# Patient Record
Sex: Male | Born: 1995 | Race: Black or African American | Hispanic: No | Marital: Single | Smoking: Never smoker
Health system: Southern US, Community
[De-identification: ages and names within clinical notes are randomized; demographics above are authoritative.]

## PROBLEM LIST (undated history)

## (undated) DIAGNOSIS — J690 Pneumonitis due to inhalation of food and vomit: Secondary | ICD-10-CM

## (undated) DIAGNOSIS — K222 Esophageal obstruction: Secondary | ICD-10-CM

## (undated) DIAGNOSIS — J189 Pneumonia, unspecified organism: Secondary | ICD-10-CM

## (undated) DIAGNOSIS — F840 Autistic disorder: Secondary | ICD-10-CM

## (undated) DIAGNOSIS — J9690 Respiratory failure, unspecified, unspecified whether with hypoxia or hypercapnia: Secondary | ICD-10-CM

## (undated) DIAGNOSIS — G930 Cerebral cysts: Secondary | ICD-10-CM

## (undated) DIAGNOSIS — I493 Ventricular premature depolarization: Secondary | ICD-10-CM

## (undated) DIAGNOSIS — Q666 Other congenital valgus deformities of feet: Secondary | ICD-10-CM

## (undated) DIAGNOSIS — G40901 Epilepsy, unspecified, not intractable, with status epilepticus: Secondary | ICD-10-CM

## (undated) DIAGNOSIS — G40909 Epilepsy, unspecified, not intractable, without status epilepticus: Secondary | ICD-10-CM

## (undated) HISTORY — DX: Pneumonia, unspecified organism: J18.9

## (undated) HISTORY — PX: REPLACE / REVISE VAGAL NERVE STIMULATOR: SUR1213

---
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
## 2015-03-08 ENCOUNTER — Encounter
Admission: EM | Disposition: A | Discharge status: Home Health | Payer: Self-pay | Source: Home / Self Care | Attending: Emergency Medicine

## 2015-03-08 ENCOUNTER — Ambulatory Visit: Payer: 59 | Admitting: Anesthesiology

## 2015-03-08 ENCOUNTER — Emergency Department: Payer: 59

## 2015-03-08 ENCOUNTER — Ambulatory Visit
Admission: EM | Admit: 2015-03-08 | Discharge: 2015-03-08 | Disposition: A | Discharge status: Home Health | Payer: 59 | Attending: Emergency Medicine | Admitting: Emergency Medicine

## 2015-03-08 DIAGNOSIS — F840 Autistic disorder: Secondary | ICD-10-CM | POA: Insufficient documentation

## 2015-03-08 DIAGNOSIS — T18198A Other foreign object in esophagus causing other injury, initial encounter: Secondary | ICD-10-CM | POA: Insufficient documentation

## 2015-03-08 DIAGNOSIS — Z7951 Long term (current) use of inhaled steroids: Secondary | ICD-10-CM | POA: Insufficient documentation

## 2015-03-08 DIAGNOSIS — Z79899 Other long term (current) drug therapy: Secondary | ICD-10-CM | POA: Diagnosis not present

## 2015-03-08 DIAGNOSIS — I493 Ventricular premature depolarization: Secondary | ICD-10-CM | POA: Diagnosis not present

## 2015-03-08 DIAGNOSIS — Z9889 Other specified postprocedural states: Secondary | ICD-10-CM | POA: Insufficient documentation

## 2015-03-08 DIAGNOSIS — K2970 Gastritis, unspecified, without bleeding: Secondary | ICD-10-CM | POA: Insufficient documentation

## 2015-03-08 DIAGNOSIS — X58XXXA Exposure to other specified factors, initial encounter: Secondary | ICD-10-CM | POA: Insufficient documentation

## 2015-03-08 DIAGNOSIS — M2140 Flat foot [pes planus] (acquired), unspecified foot: Secondary | ICD-10-CM | POA: Insufficient documentation

## 2015-03-08 DIAGNOSIS — Z91010 Allergy to peanuts: Secondary | ICD-10-CM | POA: Diagnosis not present

## 2015-03-08 DIAGNOSIS — T18108A Unspecified foreign body in esophagus causing other injury, initial encounter: Secondary | ICD-10-CM | POA: Diagnosis not present

## 2015-03-08 DIAGNOSIS — E739 Lactose intolerance, unspecified: Secondary | ICD-10-CM | POA: Diagnosis not present

## 2015-03-08 DIAGNOSIS — G40901 Epilepsy, unspecified, not intractable, with status epilepticus: Secondary | ICD-10-CM | POA: Diagnosis not present

## 2015-03-08 DIAGNOSIS — Y939 Activity, unspecified: Secondary | ICD-10-CM | POA: Diagnosis not present

## 2015-03-08 DIAGNOSIS — Z0389 Encounter for observation for other suspected diseases and conditions ruled out: Secondary | ICD-10-CM | POA: Diagnosis not present

## 2015-03-08 HISTORY — DX: Other congenital valgus deformities of feet: Q66.6

## 2015-03-08 HISTORY — DX: Epilepsy, unspecified, not intractable, with status epilepticus: G40.901

## 2015-03-08 HISTORY — DX: Pneumonitis due to inhalation of food and vomit: J69.0

## 2015-03-08 HISTORY — DX: Respiratory failure, unspecified, unspecified whether with hypoxia or hypercapnia: J96.90

## 2015-03-08 HISTORY — PX: FOREIGN BODY REMOVAL: SHX962

## 2015-03-08 HISTORY — DX: Autistic disorder: F84.0

## 2015-03-08 HISTORY — DX: Ventricular premature depolarization: I49.3

## 2015-03-08 HISTORY — DX: Cerebral cysts: G93.0

## 2015-03-08 HISTORY — DX: Epilepsy, unspecified, not intractable, without status epilepticus: G40.909

## 2015-03-08 SURGERY — ESOPHAGOGASTRODUODENOSCOPY (EGD) WITH PROPOFOL
Anesthesia: General

## 2015-03-08 SURGERY — REMOVAL, FOREIGN BODY
Anesthesia: General

## 2015-03-08 MED ORDER — FENTANYL CITRATE (PF) 100 MCG/2ML IJ SOLN
INTRAMUSCULAR | Status: DC | PRN
Start: 2015-03-08 — End: 2015-03-08
  Administered 2015-03-08: 50 ug via INTRAVENOUS

## 2015-03-08 MED ORDER — SUGAMMADEX SODIUM 200 MG/2ML IV SOLN
INTRAVENOUS | Status: DC | PRN
Start: 2015-03-08 — End: 2015-03-08
  Administered 2015-03-08: 150 mg via INTRAVENOUS

## 2015-03-08 MED ORDER — ROCURONIUM BROMIDE 100 MG/10ML IV SOLN
INTRAVENOUS | Status: DC | PRN
  Administered 2015-03-08: 40 mg via INTRAVENOUS

## 2015-03-08 MED ORDER — MIDAZOLAM HCL 2 MG/2ML IJ SOLN
INTRAMUSCULAR | Status: DC | PRN
  Administered 2015-03-08: 1 mg via INTRAVENOUS

## 2015-03-08 MED ORDER — ONDANSETRON HCL 4 MG/2ML IJ SOLN
INTRAMUSCULAR | Status: DC | PRN
  Administered 2015-03-08: 4 mg via INTRAVENOUS

## 2015-03-08 MED ORDER — PROPOFOL 10 MG/ML IV BOLUS
INTRAVENOUS | Status: DC | PRN
  Administered 2015-03-08: 150 mg via INTRAVENOUS

## 2015-03-08 MED ORDER — LIDOCAINE HCL (CARDIAC) 20 MG/ML IV SOLN
INTRAVENOUS | Status: DC | PRN
  Administered 2015-03-08: 60 mg via INTRAVENOUS

## 2015-03-08 MED ORDER — SODIUM CHLORIDE 0.9 % IV SOLN
INTRAVENOUS | Status: DC
  Administered 2015-03-08: 1000 mL via INTRAVENOUS
  Administered 2015-03-08: 14:00:00 via INTRAVENOUS

## 2015-03-08 MED ORDER — PROMETHAZINE HCL 25 MG/ML IJ SOLN: 6.2500 mg | INTRAMUSCULAR | Status: DC | PRN

## 2015-03-08 MED ORDER — FENTANYL CITRATE (PF) 100 MCG/2ML IJ SOLN: 25.0000 ug | INTRAMUSCULAR | Status: DC | PRN

## 2015-03-08 NOTE — ED Notes (Signed)
Pt is low functioning autistic per mom.  Pt eats things without understanding what they are.  Pt ate part of pullup before bed.  Mom became concerned after they could not give patient seizure medication.

## 2015-03-08 NOTE — ED Notes (Signed)
Pt transported to endo.  

## 2015-03-08 NOTE — Anesthesia Postprocedure Evaluation (Signed)
Anesthesia Post Note  Patient: Gustav A Agudelo  Procedure(s) Performed: Procedure(s) (LRB): FOREIGN BODY REMOVAL (N/A)  Patient location during evaluation: PACU Anesthesia Type: General Level of consciousness: awake and alert Pain management: pain level controlled Vital Signs Assessment: post-procedure vital signs reviewed and stable Respiratory status: spontaneous breathing, nonlabored ventilation, respiratory function stable and patient connected to nasal cannula oxygen Cardiovascular status: blood pressure returned to baseline and stable Postop Assessment: no signs of nausea or vomiting Anesthetic complications: no    Last Vitals:  Filed Vitals:   03/08/15 1445 03/08/15 1453  BP: 104/59 121/81  Pulse: 71 78  Temp: 36.3 C   Resp: 13 18    Last Pain: There were no vitals filed for this visit.               Lenard Simmer

## 2015-03-08 NOTE — Transfer of Care (Signed)
Immediate Anesthesia Transfer of Care Note  Patient: Noah Marshall  Procedure(s) Performed: Procedure(s): FOREIGN BODY REMOVAL (N/A)  Patient Location: PACU  Anesthesia Type:General  Level of Consciousness: awake  Airway & Oxygen Therapy: Patient Spontanous Breathing  Post-op Assessment: Report given to RN  Post vital signs: Reviewed and stable  Last Vitals:  Filed Vitals:   03/08/15 1425 03/08/15 1431  BP: 113/70 113/70  Pulse: 73 86  Temp: 36.2 C 97.3F  Resp: 13 23    Complications: No apparent anesthesia complications

## 2015-03-08 NOTE — ED Provider Notes (Signed)
[facility] Emergency Department Provider Note  ____________________________________________  Time seen: 4:15 AM  I have reviewed the triage vital signs and the nursing notes.   HISTORY  Chief Complaint Swallowed Foreign Body      HPI Noah Marshall is a 20 y.o. male with history of autism presents with history of "eating a part of his depends diaper" before bed. Patient's mother states that the young man "put stuff in his mouth all the time". However since the event last night he has had persistent drooling and inability to drink or take his medications by mouth.     Past Medical History  Diagnosis Date  . Autism   . Intracranial arachnoid cysts   . Epilepsy (HCC)   . Epilepsy with status epilepticus (HCC)   . Premature ventricular contractions   . Aspiration pneumonia (HCC)   . Pes planovalgus   . Respiratory failure (HCC)     There are no active problems to display for this patient.   Past Surgical History  Procedure Laterality Date  . Implantation vagal nerve stimulator  2011    Current Outpatient Rx  Name  Route  Sig  Dispense  Refill  . albuterol (PROVENTIL) (2.5 MG/3ML) 0.083% nebulizer solution   Nebulization   Take 2.5 mg by nebulization every 6 (six) hours as needed for wheezing or shortness of breath.         . cetirizine (ZYRTEC) 1 MG/ML syrup   Oral   Take 15 mg by mouth daily.         . cloBAZam (ONFI) 2.5 MG/ML solution   Oral   Take 6 mg by mouth 2 (two) times daily.         . clonazePAM (KLONOPIN) 0.5 MG tablet   Oral   Take 0.5 mg by mouth 3 (three) times daily as needed for anxiety.         . fluticasone (FLONASE) 50 MCG/ACT nasal spray   Each Nare   Place 2 sprays into both nostrils daily.         Marland Kitchen guanFACINE (TENEX) 1 MG tablet   Oral   Take 1 mg by mouth at bedtime.         . levOCARNitine (CARNITOR) 1 GM/10ML solution   Oral   Take 500 mg by mouth 2 (two) times daily.         .  montelukast (SINGULAIR) 10 MG tablet   Oral   Take 10 mg by mouth at bedtime.         . OXcarbazepine (TRILEPTAL) 300 MG/5ML suspension   Oral   Take 600 mg by mouth 2 (two) times daily.          Marland Kitchen topiramate (TOPAMAX) 200 MG tablet   Oral   Take 200 mg by mouth 2 (two) times daily.         Marland Kitchen valproic acid (DEPAKENE) 250 MG/5ML syrup   Oral   Take 500 mg by mouth 2 (two) times daily.           Allergies Lactose intolerance (gi) and Peanut-containing drug products  No family history on file.  Social History Social History  Substance Use Topics  . Smoking status: Not on file  . Smokeless tobacco: Not on file  . Alcohol Use: Not on file    Review of Systems  Constitutional: Negative for fever. Eyes: Negative for visual changes. ENT: Negative for sore throat. Positive for drooling Cardiovascular: Negative for chest pain. Respiratory:  Negative for shortness of breath. Gastrointestinal: Negative for abdominal pain, vomiting and diarrhea. Genitourinary: Negative for dysuria. Musculoskeletal: Negative for back pain. Skin: Negative for rash. Neurological: Negative for headaches, focal weakness or numbness.   10-point ROS otherwise negative.  ____________________________________________   PHYSICAL EXAM:  VITAL SIGNS: ED Triage Vitals  Enc Vitals Group     BP 03/08/15 0356 110/67 mmHg     Pulse Rate 03/08/15 0356 87     Resp 03/08/15 0356 20     Temp --      Temp src --      SpO2 03/08/15 0356 99 %     Weight --      Height --      Head Cir --      Peak Flow --      Pain Score --      Pain Loc --      Pain Edu? --      Excl. in GC? --      Constitutional: Alert and oriented. Actively drooling on my arrival to the room Eyes: Conjunctivae are normal. PERRL. Normal extraocular movements. ENT   Head: Normocephalic and atraumatic.   Nose: No congestion/rhinnorhea.   Mouth/Throat: Mucous membranes are moist.   Neck: No  stridor. Hematological/Lymphatic/Immunilogical: No cervical lymphadenopathy. Cardiovascular: Normal rate, regular rhythm. Normal and symmetric distal pulses are present in all extremities. No murmurs, rubs, or gallops. Respiratory: Normal respiratory effort without tachypnea nor retractions. Breath sounds are clear and equal bilaterally. No wheezes/rales/rhonchi. Gastrointestinal: Soft and nontender. No distention. There is no CVA tenderness. Genitourinary: deferred Musculoskeletal: Nontender with normal range of motion in all extremities. No joint effusions.  No lower extremity tenderness nor edema. Neurologic:  Normal speech and language. No gross focal neurologic deficits are appreciated. Speech is normal.  Skin:  Skin is warm, dry and intact. No rash noted. Psychiatric: Mood and affect are normal. Speech and behavior are normal. Patient exhibits appropriate insight and judgment.      RADIOLOGY    DG Neck Soft Tissue (Final result) Result time: 03/08/15 02:18:46   Final result by Rad Results In Interface (03/08/15 02:18:46)   Narrative:   CLINICAL DATA: Question of foreign body in throat. Patient rolling. Initial encounter.  EXAM: NECK SOFT TISSUES - 1+ VIEW  COMPARISON: None.  FINDINGS: No radiopaque foreign bodies are seen, aside from the leads along the left side of the neck and left chest wall.  The nasopharynx, oropharynx and hypopharynx are unremarkable. The epiglottis is normal in thickness. Prevertebral soft tissues are within normal limits. No acute osseous abnormalities are seen. The visualized lung apices are grossly clear.  IMPRESSION: No radiopaque foreign bodies seen.   Electronically Signed By: Roanna Raider M.D. On: 03/08/2015 02:18          DG Chest 2 View (Final result) Result time: 03/08/15 02:19:00   Final result by Rad Results In Interface (03/08/15 02:19:00)   Narrative:   CLINICAL DATA: 86-year-old male but part of foreign  are. In the throat.  EXAM: CHEST 2 VIEW  COMPARISON: Radiograph of the port  FINDINGS: Two views of the chest are demonstrate a focal consolidation there is no pleural vertebra are correct. The cardiac silhouette is within normal limits. A stimulator device with generator over the left pectoral region noted at the extending in the lower cervical region. No other foreign object identified. The osseous structures are grossly unremarkable.  IMPRESSION: No radiopaque foreign object identified over the expected course of the esophagus.  Electronically Signed By: Elgie Collard M.D. On: 03/08/2015 02:19        INITIAL IMPRESSION / ASSESSMENT AND PLAN / ED COURSE  Pertinent labs & imaging results that were available during my care of the patient were reviewed by me and considered in my medical decision making (see chart for details). Patient unable to tolerate drinking carbonated beverage in the emergency department Patient discussed with Dr. Alycia Rossetti gastrologist on call with plan for endoscopy for possible esophageal foreign body.  ____________________________________________   FINAL CLINICAL IMPRESSION(S) / ED DIAGNOSES  Final diagnoses:  Esophageal foreign body, initial encounter      Darci Current, MD 03/08/15 [phone]

## 2015-03-08 NOTE — ED Notes (Signed)
Report given to Tiffany in Endo, [place] states they will start an IV in endoscopy, family notified that they would be coming to get pt soon

## 2015-03-08 NOTE — H&P (Signed)
Primary Care Physician:  No primary care provider on file.  Pre-Procedure History & Physical: HPI:  Noah Marshall is a 20 y.o. male is here for an endoscopy.   Past Medical History  Diagnosis Date  . Autism   . Intracranial arachnoid cysts   . Epilepsy (HCC)   . Epilepsy with status epilepticus (HCC)   . Premature ventricular contractions   . Aspiration pneumonia (HCC)   . Pes planovalgus   . Respiratory failure [facility])     Past Surgical History  Procedure Laterality Date  . Implantation vagal nerve stimulator  2011    Prior to Admission medications   Medication Sig Start Date End Date Taking? Authorizing Provider  albuterol (PROVENTIL) (2.5 MG/3ML) 0.083% nebulizer solution Take 2.5 mg by nebulization every 6 (six) hours as needed for wheezing or shortness of breath.   Yes Historical Provider, MD  cetirizine (ZYRTEC) 1 MG/ML syrup Take 15 mg by mouth daily.   Yes Historical Provider, MD  cloBAZam (ONFI) 2.5 MG/ML solution Take 6 mg by mouth 2 (two) times daily.   Yes Historical Provider, MD  clonazePAM (KLONOPIN) 0.5 MG tablet Take 0.5 mg by mouth 3 (three) times daily as needed for anxiety.   Yes Historical Provider, MD  fluticasone (FLONASE) 50 MCG/ACT nasal spray Place 2 sprays into both nostrils daily.   Yes Historical Provider, MD  guanFACINE (TENEX) 1 MG tablet Take 1 mg by mouth at bedtime.   Yes Historical Provider, MD  levOCARNitine (CARNITOR) 1 GM/10ML solution Take 500 mg by mouth 2 (two) times daily.   Yes Historical Provider, MD  montelukast (SINGULAIR) 10 MG tablet Take 10 mg by mouth at bedtime.   Yes Historical Provider, MD  OXcarbazepine (TRILEPTAL) 300 MG/5ML suspension Take 600 mg by mouth 2 (two) times daily.    Yes Historical Provider, MD  topiramate (TOPAMAX) 200 MG tablet Take 200 mg by mouth 2 (two) times daily.   Yes Historical Provider, MD  valproic acid (DEPAKENE) 250 MG/5ML syrup Take 500 mg by mouth 2 (two) times daily.   Yes Historical Provider, MD     Allergies as of 03/08/2015 - Review Complete 03/08/2015  Allergen Reaction Noted  . Lactose intolerance (gi)  03/08/2015  . Peanut-containing drug products  03/08/2015    History reviewed. No pertinent family history.  Social History   Social History  . Marital Status: Single    Spouse Name: N/A  . Number of Children: N/A  . Years of Education: N/A   Occupational History  . Not on file.   Social History Main Topics  . Smoking status: Not on file  . Smokeless tobacco: Not on file  . Alcohol Use: Not on file  . Drug Use: Not on file  . Sexual Activity: Not on file   Other Topics Concern  . Not on file   Social History Narrative  . No narrative on file     Physical Exam: BP 123/63 mmHg  Pulse 77  Temp(Src) 96.2 F (35.7 C) (Tympanic)  Resp 20  Ht  (1.702 m)  SpO2 100% General:    NAD, + drooling Head:  Normocephalic and atraumatic. Neck:  Supple; no masses or thyromegaly. Lungs:  Clear throughout to auscultation.    Heart:  Regular rate and rhythm. Abdomen:  Soft, nontender and nondistended. Normal bowel sounds, without guarding, and without rebound.   Neurologic:    grossly normal neurologically.  Impression/Plan: Noah Marshall is here for an endoscopy to be performed  for possible esophageal impaction, inability to swallow  Risks, benefits, limitations, and alternatives regarding  endoscopy have been discussed with the mom.  Questions have been answered.  All parties agreeable.    Elnita Maxwell, MD  03/08/2015, 1:17 PM

## 2015-03-08 NOTE — Anesthesia Preprocedure Evaluation (Addendum)
Anesthesia Evaluation  Patient identified by MRN, date of birth, ID band Patient awake    Reviewed: Allergy & Precautions, H&P , NPO status , Patient's Chart, lab work & pertinent test results, reviewed documented beta blocker date and time   History of Anesthesia Complications Negative for: history of anesthetic complications  Airway Mallampati: IV  TM Distance: >3 FB Neck ROM: full  Mouth opening: Pediatric Airway  Dental no notable dental hx. (+) Teeth Intact   Pulmonary neg pulmonary ROS, neg shortness of breath, asthma , neg sleep apnea, pneumonia, resolved, neg recent URI,    Pulmonary exam normal breath sounds clear to auscultation       Cardiovascular Exercise Tolerance: Good Normal cardiovascular exam+ dysrhythmias (PVCs)  Rhythm:regular Rate:Normal     Neuro/Psych Seizures -, Well Controlled,  PSYCHIATRIC DISORDERS Autismnegative psych ROS   GI/Hepatic negative GI ROS, Neg liver ROS, Foreign body in esophagus   Endo/Other  negative endocrine ROS  Renal/GU negative Renal ROS  negative genitourinary   Musculoskeletal negative musculoskeletal ROS (+)   Abdominal   Peds negative pediatric ROS (+)  Hematology negative hematology ROS (+)   Anesthesia Other Findings Past Medical History:   Autism                                                       Intracranial arachnoid cysts                                 Epilepsy (HCC)                                               Epilepsy with status epilepticus (HCC)                       Premature ventricular contractions                           Aspiration pneumonia (HCC)                                   Pes planovalgus                                              Respiratory failure (HCC)                                    Reproductive/Obstetrics negative OB ROS                             Anesthesia Physical Anesthesia Plan  ASA:  II  Anesthesia Plan: General ETT, Rapid Sequence and Cricoid Pressure   Post-op Pain Management:    Induction:   Airway Management Planned:   Additional Equipment:   Intra-op Plan:   Post-operative Plan:   Informed Consent:  I have reviewed the patients History and Physical, chart, labs and discussed the procedure including the risks, benefits and alternatives for the proposed anesthesia with the patient or authorized representative who has indicated his/her understanding and acceptance.   Dental Advisory Given  Plan Discussed with: Anesthesiologist, CRNA and Surgeon  Anesthesia Plan Comments:         Anesthesia Quick Evaluation

## 2015-03-08 NOTE — ED Notes (Addendum)
See paper chart. Pt arrived during computer "down time". 

## 2015-03-08 NOTE — Anesthesia Procedure Notes (Signed)
Procedure Name: Intubation Date/Time: 03/08/2015 1:43 PM Performed by: Lily Kocher Pre-anesthesia Checklist: Patient identified, Patient being monitored, Timeout performed, Emergency Drugs available and Suction available Patient Re-evaluated:Patient Re-evaluated prior to inductionOxygen Delivery Method: Circle system utilized Preoxygenation: Pre-oxygenation with 100% oxygen Intubation Type: IV induction and Cricoid Pressure applied Ventilation: Mask ventilation without difficulty Laryngoscope Size: Glidescope and 4 Grade View: Grade I Tube type: Oral Tube size: 7.0 mm Number of attempts: 2 Airway Equipment and Method: Stylet Placement Confirmation: ETT inserted through vocal cords under direct vision,  positive ETCO2 and breath sounds checked- equal and bilateral Secured at: 21 cm Tube secured with: Tape Dental Injury: Teeth and Oropharynx as per pre-operative assessment

## 2015-03-09 ENCOUNTER — Encounter: Payer: Self-pay | Admitting: Gastroenterology

## 2015-03-09 NOTE — Op Note (Signed)
[facility] Gastroenterology Patient Name: Noah Marshall Procedure Date: 03/08/2015 1:42 PM MRN: [id number] Account #: [id number] Date of Birth: 09/23/1995 Admit Type: Outpatient Age: 20 Room: [facility] ENDO ROOM 1 Gender: Male Note Status: Finalized Procedure:         Upper GI endoscopy Indications:       Foreign body in the esophagus Providers:         Rhona Raider. Shelle Iron, MD Medicines:         Propofol per Anesthesia Complications:     No immediate complications. Procedure:         Pre-Anesthesia Assessment:                    - Prior to the procedure, a History and Physical was                     performed, and patient medications, allergies and                     sensitivities were reviewed. The patient's tolerance of                     previous anesthesia was reviewed.                    After obtaining informed consent, the endoscope was passed                     under direct vision. Throughout the procedure, the                     patient's blood pressure, pulse, and oxygen saturations                     were monitored continuously. The Endoscope was introduced                     through the mouth, and advanced to the second part of                     duodenum. The upper GI endoscopy was accomplished without                     difficulty. The patient tolerated the procedure well. Findings:      Diaper material was found in the upper third of the esophagus. Removed       with raptor forceps      Diffuse mild inflammation characterized by erythema was found in the       gastric body.      The examined duodenum was normal. Impression:        - Diaper material found in the esophagus. Removed.                    - Gastritis.                    - Normal examined duodenum.                    - No specimens collected. Recommendation:    - Observe patient in PACU.                    - Resume regular diet.                    -  Continue present medications.                 - The findings and recommendations were discussed with the                     patient's family. Procedure Code(s): --- Professional ---                    [id number], Esophagogastroduodenoscopy, flexible, transoral;                     diagnostic, including collection of specimen(s) by                     brushing or washing, when performed (separate procedure) Diagnosis Code(s): --- Professional ---                    N82.956O, Other foreign object in esophagus causing other                     injury, initial encounter                    K29.70, Gastritis, unspecified, without bleeding                    T18.108A, Unspecified foreign body in esophagus causing                     other injury, initial encounter CPT copyright 2014 American Medical Association. All rights reserved. The codes documented in this report are preliminary and upon coder review may  be revised to meet current compliance requirements. Kathalene Frames, MD 03/08/2015 1:55:46 PM This report has been signed electronically. Number of Addenda: 0 Note Initiated On: 03/08/2015 1:42 PM      [facility]

## 2015-05-09 DIAGNOSIS — G40901 Epilepsy, unspecified, not intractable, with status epilepticus: Secondary | ICD-10-CM | POA: Diagnosis not present

## 2015-05-09 DIAGNOSIS — G40909 Epilepsy, unspecified, not intractable, without status epilepticus: Secondary | ICD-10-CM | POA: Diagnosis not present

## 2015-05-09 DIAGNOSIS — R569 Unspecified convulsions: Secondary | ICD-10-CM | POA: Diagnosis not present

## 2015-05-09 DIAGNOSIS — G40311 Generalized idiopathic epilepsy and epileptic syndromes, intractable, with status epilepticus: Secondary | ICD-10-CM | POA: Diagnosis not present

## 2015-06-15 DIAGNOSIS — J301 Allergic rhinitis due to pollen: Secondary | ICD-10-CM | POA: Diagnosis not present

## 2015-06-15 DIAGNOSIS — J4530 Mild persistent asthma, uncomplicated: Secondary | ICD-10-CM | POA: Diagnosis not present

## 2015-06-15 DIAGNOSIS — J3089 Other allergic rhinitis: Secondary | ICD-10-CM | POA: Diagnosis not present

## 2015-06-15 DIAGNOSIS — Z91010 Allergy to peanuts: Secondary | ICD-10-CM | POA: Diagnosis not present

## 2015-06-23 ENCOUNTER — Other Ambulatory Visit: Payer: Self-pay

## 2015-06-23 NOTE — Patient Outreach (Signed)
[facility] Care Management  06/23/2015  Percell LocusLance A Stickles 12/06/1995 [id number]   Phone call for high cost referral assessment to Mother.  No answer and message left.  Plan to call next week. Dudley MajorMelissa Sandlin RN, [facility],CCM Care Management Coordinator-Link to [facility] [phone]

## 2015-06-27 ENCOUNTER — Other Ambulatory Visit: Payer: Self-pay

## 2015-06-27 NOTE — Patient Outreach (Signed)
[facility] Care Management  06/27/2015  Noah Marshall 08/31/1995 [id number]  Phone call for high cost referral assessment.  Attempt #2.  No answer and message left.  Plan to attempt call next week. Dudley MajorMelissa Sandlin RN, [facility],CCM Care Management Coordinator-Link to [facility] Care Management [phone]

## 2015-07-04 ENCOUNTER — Other Ambulatory Visit: Payer: Self-pay

## 2015-07-04 NOTE — Patient Outreach (Signed)
[facility] Care Management  07/04/2015  Percell LocusLance A Kiraly 05/29/1995 [id number]   Phone call for high cost referral assessment.  3rd attempt.  No answer and message left.  Plan to send letter. Unable to contact member. Dudley MajorMelissa Sandlin RN, [facility],CCM Care Management Coordinator-Link to Well[facility] [phone]

## 2015-08-02 ENCOUNTER — Other Ambulatory Visit
Admission: RE | Admit: 2015-08-02 | Discharge: 2015-08-02 | Disposition: A | Discharge status: Home / Self Care | Payer: 59 | Source: Ambulatory Visit | Attending: Neurology | Admitting: Neurology

## 2015-08-02 DIAGNOSIS — G40909 Epilepsy, unspecified, not intractable, without status epilepticus: Secondary | ICD-10-CM | POA: Insufficient documentation

## 2015-08-02 LAB — COMPREHENSIVE METABOLIC PANEL
ALT: 8 U/L — ABNORMAL LOW (ref 17–63)
AST: 19 U/L (ref 15–41)
Albumin: 4.4 g/dL (ref 3.5–5.0)
Alkaline Phosphatase: 50 U/L (ref 38–126)
Anion gap: 10 (ref 5–15)
BUN: 8 mg/dL (ref 6–20)
CO2: 22 mmol/L (ref 22–32)
Calcium: 9.4 mg/dL (ref 8.9–10.3)
Chloride: 95 mmol/L — ABNORMAL LOW (ref 101–111)
Creatinine, Ser: 0.49 mg/dL — ABNORMAL LOW (ref 0.61–1.24)
GFR calc Af Amer: >60 mL/min (ref >60)
GFR calc non Af Amer: >60 mL/min (ref >60)
Glucose, Bld: 82 mg/dL (ref 65–99)
Potassium: 3.7 mmol/L (ref 3.5–5.1)
Sodium: 127 mmol/L — ABNORMAL LOW (ref 135–145)
Total Bilirubin: 0.5 mg/dL (ref 0.3–1.2)
Total Protein: 7.5 g/dL (ref 6.5–8.1)

## 2015-08-02 LAB — CBC
HCT: 35.3 % — ABNORMAL LOW (ref 40.0–52.0)
Hemoglobin: 12.9 g/dL — ABNORMAL LOW (ref 13.0–18.0)
MCH: 31.4 pg (ref 26.0–34.0)
MCHC: 36.5 g/dL — ABNORMAL HIGH (ref 32.0–36.0)
MCV: 85.9 fL (ref 80.0–100.0)
Platelets: 140 10*3/uL — ABNORMAL LOW (ref 150–440)
RBC: 4.11 MIL/uL — ABNORMAL LOW (ref 4.40–5.90)
RDW: 13.7 % (ref 11.5–14.5)
WBC: 6.8 10*3/uL (ref 3.8–10.6)

## 2015-08-02 LAB — VALPROIC ACID LEVEL: Valproic Acid Lvl: 117 ug/mL — ABNORMAL HIGH (ref 50.0–100.0)

## 2015-08-06 LAB — 10-HYDROXYCARBAZEPINE: Triliptal/MTB(Oxcarbazepin): 18 ug/mL (ref 10–35)

## 2015-09-12 ENCOUNTER — Emergency Department: Payer: 59

## 2015-09-12 ENCOUNTER — Inpatient Hospital Stay
Admission: EM | Admit: 2015-09-12 | Discharge: 2015-09-14 | DRG: 100 | Disposition: A | Discharge status: Home / Self Care | Payer: 59 | Attending: Internal Medicine | Admitting: Internal Medicine

## 2015-09-12 ENCOUNTER — Encounter: Payer: Self-pay | Admitting: Emergency Medicine

## 2015-09-12 DIAGNOSIS — Z833 Family history of diabetes mellitus: Secondary | ICD-10-CM

## 2015-09-12 DIAGNOSIS — Z91011 Allergy to milk products: Secondary | ICD-10-CM

## 2015-09-12 DIAGNOSIS — G40911 Epilepsy, unspecified, intractable, with status epilepticus: Secondary | ICD-10-CM | POA: Diagnosis not present

## 2015-09-12 DIAGNOSIS — J690 Pneumonitis due to inhalation of food and vomit: Secondary | ICD-10-CM | POA: Diagnosis present

## 2015-09-12 DIAGNOSIS — F840 Autistic disorder: Secondary | ICD-10-CM | POA: Diagnosis not present

## 2015-09-12 DIAGNOSIS — R509 Fever, unspecified: Secondary | ICD-10-CM

## 2015-09-12 DIAGNOSIS — Z7951 Long term (current) use of inhaled steroids: Secondary | ICD-10-CM

## 2015-09-12 DIAGNOSIS — G40909 Epilepsy, unspecified, not intractable, without status epilepticus: Secondary | ICD-10-CM

## 2015-09-12 DIAGNOSIS — Z9889 Other specified postprocedural states: Secondary | ICD-10-CM

## 2015-09-12 DIAGNOSIS — T426X5A Adverse effect of other antiepileptic and sedative-hypnotic drugs, initial encounter: Secondary | ICD-10-CM | POA: Diagnosis present

## 2015-09-12 DIAGNOSIS — R569 Unspecified convulsions: Secondary | ICD-10-CM

## 2015-09-12 DIAGNOSIS — Z8249 Family history of ischemic heart disease and other diseases of the circulatory system: Secondary | ICD-10-CM | POA: Diagnosis not present

## 2015-09-12 DIAGNOSIS — D696 Thrombocytopenia, unspecified: Secondary | ICD-10-CM | POA: Diagnosis not present

## 2015-09-12 DIAGNOSIS — Z888 Allergy status to other drugs, medicaments and biological substances status: Secondary | ICD-10-CM

## 2015-09-12 DIAGNOSIS — E871 Hypo-osmolality and hyponatremia: Secondary | ICD-10-CM | POA: Diagnosis not present

## 2015-09-12 DIAGNOSIS — J189 Pneumonia, unspecified organism: Secondary | ICD-10-CM

## 2015-09-12 DIAGNOSIS — G40011 Localization-related (focal) (partial) idiopathic epilepsy and epileptic syndromes with seizures of localized onset, intractable, with status epilepticus: Secondary | ICD-10-CM | POA: Diagnosis not present

## 2015-09-12 NOTE — ED Provider Notes (Signed)
[facility] Emergency Department Provider Note   ____________________________________________   First MD Initiated Contact with Patient 09/12/15 2309     (approximate)  I have reviewed the triage vital signs and the nursing notes.   HISTORY  Chief Complaint Seizures  History obtained from patient's mother.  HPI Noah Marshall is a 20 y.o. male who comes into the hospital today with 6 seizures. The patient has a history of autism and seizures and according to mom he had 6 today. It started about 5 AM and then he had him every few hours until 10:30 this evening. Mom reports that every time he had a seizure they used his vagal nerve stimulator and the seizure did stop within a minute. His last seizure was approximately 15 seconds but mom was concerned that he had so many she decided to call EMS. Mom reports that the patient's seizures have been more controlled and he has them about one every 3 weeks. Mom reports he has not missed any medications but did not get all of his Depakote tonight. The patient has had some wet sounding cough that she reports started today. He did receive 2 breathing treatments but has had no fevers, nausea or vomiting. The patient does have a history of aspiration and mom also reports that his urine has smelled strong but she thinks that that may be due to his medications. Otherwise mom denies anything out of the ordinary that has been going on with the patient. He is nonverbal at baseline and does not always follow commands due to lack of understanding. He is here for evaluation tonight.   Past Medical History:  Diagnosis Date  . Aspiration pneumonia (HCC)   . Autism   . Epilepsy (HCC)   . Epilepsy with status epilepticus (HCC)   . Intracranial arachnoid cysts   . Pes planovalgus   . Premature ventricular contractions   . Respiratory failure (HCC)     There are no active problems to display for this patient.   Past Surgical  History:  Procedure Laterality Date  . FOREIGN BODY REMOVAL N/A 03/08/2015   Procedure: FOREIGN BODY REMOVAL;  Surgeon: Elnita MaxwellMatthew Gordon Rein, MD;  [facility];  Service: Endoscopy;  Laterality: N/A;  . IMPLANTATION VAGAL NERVE STIMULATOR  2011    Prior to Admission medications   Medication Sig Start Date End Date Taking? Authorizing Provider  albuterol (PROVENTIL) (2.5 MG/3ML) 0.083% nebulizer solution Take 2.5 mg by nebulization every 6 (six) hours as needed for wheezing or shortness of breath.   Yes Historical Provider, MD  cetirizine (ZYRTEC) 1 MG/ML syrup Take 15 mg by mouth daily.   Yes Historical Provider, MD  cloBAZam (ONFI) 2.5 MG/ML solution Place 15 mg into feeding tube 2 (two) times daily.    Yes Historical Provider, MD  clonazePAM (KLONOPIN) 0.5 MG tablet Take 0.5 mg by mouth 3 (three) times daily as needed for anxiety.   Yes Historical Provider, MD  fluticasone (FLONASE) 50 MCG/ACT nasal spray Place 2 sprays into both nostrils daily.   Yes Historical Provider, MD  guanFACINE (TENEX) 1 MG tablet Place 1 mg into feeding tube at bedtime.    Yes Historical Provider, MD  levOCARNitine (CARNITOR) 1 GM/10ML solution Take 500 mg by mouth 2 (two) times daily.   Yes Historical Provider, MD  montelukast (SINGULAIR) 10 MG tablet Take 10 mg by mouth at bedtime.   Yes Historical Provider, MD  OXcarbazepine (TRILEPTAL) 300 MG/5ML suspension Take 600 mg by mouth  2 (two) times daily.    Yes Historical Provider, MD  topiramate (TOPAMAX) 200 MG tablet Place 200 mg into feeding tube 2 (two) times daily.    Yes Historical Provider, MD  valproic acid (DEPAKENE) 250 MG/5ML syrup Take 500 mg by mouth 2 (two) times daily.   Yes Historical Provider, MD    Allergies Lactose intolerance (gi) and Peanut-containing drug products  No family history on file.  Social History Social History  Substance Use Topics  . Smoking status: Never Smoker  . Smokeless tobacco: Never Used  . Alcohol use Not  on file    Review of Systems Constitutional: No fever/chills Eyes: No visual changes. ENT: No sore throat. Cardiovascular: Denies chest pain. Respiratory: Cough Gastrointestinal: No abdominal pain.  No nausea, no vomiting.  No diarrhea.  No constipation. Genitourinary: Negative for dysuria. Musculoskeletal: Negative for back pain. Skin: Negative for rash. Neurological: Altered seizures  10-point ROS otherwise negative.  ____________________________________________   PHYSICAL EXAM:  VITAL SIGNS: ED Triage Vitals  Enc Vitals Group     BP                                     09/12/15  2320 106/69     Pulse                                 09/12/15  2320 93     Resp                                  09/12/15  2320 16     Temp 98.1     Temp src      SpO2                                  09/12/15  2320 98%     Weight      Height      Head Circumference      Peak Flow      Pain Score      Pain Loc      Pain Edu?      Excl. in GC?     Constitutional: Alert non verbal and not interactive at baseline. Well appearing and in no acute distress. Eyes: Conjunctivae are normal. PERRL. EOMI. Head: Atraumatic. Nose: No congestion/rhinnorhea. Mouth/Throat: Mucous membranes are moist.  Oropharynx non-erythematous. Cardiovascular: Normal rate, regular rhythm. Grossly normal heart sounds.  Good peripheral circulation. Respiratory: Normal respiratory effort.  No retractions. Lungs CTAB. Gastrointestinal: Soft and nontender. No distention. Positive bowel sounds. Musculoskeletal: No lower extremity tenderness nor edema.   Neurologic:  Non verbal and does not follow commands, contractures of arms and legs Skin:  Skin is warm, dry and intact. No rash noted. Psychiatric: at baseline  ____________________________________________   LABS (all labs ordered are listed, but only abnormal results are displayed)  Labs Reviewed  CBC - Abnormal; Notable for the following:       Result Value   RBC  3.92 (*)    Hemoglobin 12.3 (*)    HCT 34.7 (*)    Platelets 90 (*)    All other components within normal limits  COMPREHENSIVE METABOLIC PANEL - Abnormal; Notable for the following:  Sodium 127 (*)    Chloride 94 (*)    Creatinine, Ser 0.37 (*)    Calcium 8.7 (*)    ALT 7 (*)    All other components within normal limits  URINALYSIS COMPLETEWITH MICROSCOPIC ([facility] ONLY) - Abnormal; Notable for the following:    Color, Urine YELLOW (*)    APPearance CLOUDY (*)    Ketones, ur TRACE (*)    Hgb urine dipstick 1+ (*)    All other components within normal limits  VALPROIC ACID LEVEL - Abnormal; Notable for the following:    Valproic Acid Lvl 116 (*)    All other components within normal limits   ____________________________________________  EKG  ED ECG REPORT I, Rebecka Apley, the attending physician, personally viewed and interpreted this ECG.   Date: 09/12/2015  EKG Time: 2327  Rate: 89  Rhythm: normal sinus rhythm  Axis: Normal  Intervals:none  ST&T Change: none  ____________________________________________  RADIOLOGY  CT head Chest x-ray ____________________________________________   PROCEDURES  Procedure(s) performed: None  Procedures  Critical Care performed: No  ____________________________________________   INITIAL IMPRESSION / ASSESSMENT AND PLAN / ED COURSE  Pertinent labs & imaging results that were available during my care of the patient were reviewed by me and considered in my medical decision making (see chart for details).  This is a 20 year old male who comes into the hospital today with 6 seizures. We will check the patient's blood work, urine, a chest x-ray and a head CT. At this time the patient is not seizing. We will continue to monitor the patient.  Clinical Course  Value Comment By Time  DG Chest Portable 1 View Lungs hypoexpanded. Right basilar airspace opacity may reflect atelectasis or possibly pneumonia Rebecka Apley,  MD 08/16 0032  CT Head Wo Contrast Unremarkable noncontrast CT of the head.  Rebecka Apley, MD 08/16 0032  Sodium: (!) 127 The patient's sodium is 127. He did have a previous sodium of the same reading but given the patient's frequent seizures that this may have contributed. He also appears to have an opacity on his chest x-ray. I will await the results of the urinalysis and disposition the patient at that time.  Rebecka Apley, MD 08/16 [phone]    The patient's urinalysis does have some blood but no signs of infection. Given his sodium as well as this right lower lobe opacity I will admit the patient to the hospitalist service. I will give the patient a dose of ceftriaxone and azithromycin for his pneumonia and have him further evaluated by the hospitalist. I discussed this with the patient's mother and she understands and she agrees with the plan to admit the patient. The patient has had no further seizures at this time and he is in no acute distress. ____________________________________________   FINAL CLINICAL IMPRESSION(S) / ED DIAGNOSES  Final diagnoses:  Seizure (HCC)  Hyponatremia  Community acquired pneumonia      NEW MEDICATIONS STARTED DURING THIS VISIT:  New Prescriptions   No medications on file     Note:  This document was prepared using Dragon voice recognition software and may include unintentional dictation errors.    Rebecka Apley, MD 09/13/15 [phone]

## 2015-09-12 NOTE — ED Triage Notes (Signed)
Pt presents to ED via [facility] from home with c/o having 6 seizures, mother states longest seizure 1.5 min. Mother reports pt has vagus nerve stimulator. Pt is nonverbal. MD at bedside.

## 2015-09-13 ENCOUNTER — Emergency Department: Payer: 59

## 2015-09-13 ENCOUNTER — Encounter: Payer: Self-pay | Admitting: Internal Medicine

## 2015-09-13 DIAGNOSIS — R05 Cough: Secondary | ICD-10-CM | POA: Diagnosis not present

## 2015-09-13 DIAGNOSIS — F840 Autistic disorder: Secondary | ICD-10-CM | POA: Diagnosis not present

## 2015-09-13 DIAGNOSIS — Z8249 Family history of ischemic heart disease and other diseases of the circulatory system: Secondary | ICD-10-CM | POA: Diagnosis not present

## 2015-09-13 DIAGNOSIS — Z833 Family history of diabetes mellitus: Secondary | ICD-10-CM | POA: Diagnosis not present

## 2015-09-13 DIAGNOSIS — R918 Other nonspecific abnormal finding of lung field: Secondary | ICD-10-CM | POA: Diagnosis not present

## 2015-09-13 DIAGNOSIS — T426X5A Adverse effect of other antiepileptic and sedative-hypnotic drugs, initial encounter: Secondary | ICD-10-CM | POA: Diagnosis present

## 2015-09-13 DIAGNOSIS — G40011 Localization-related (focal) (partial) idiopathic epilepsy and epileptic syndromes with seizures of localized onset, intractable, with status epilepticus: Secondary | ICD-10-CM | POA: Diagnosis not present

## 2015-09-13 DIAGNOSIS — G40911 Epilepsy, unspecified, intractable, with status epilepticus: Secondary | ICD-10-CM | POA: Diagnosis present

## 2015-09-13 DIAGNOSIS — E871 Hypo-osmolality and hyponatremia: Secondary | ICD-10-CM | POA: Diagnosis not present

## 2015-09-13 DIAGNOSIS — R569 Unspecified convulsions: Secondary | ICD-10-CM | POA: Diagnosis not present

## 2015-09-13 DIAGNOSIS — Z91011 Allergy to milk products: Secondary | ICD-10-CM | POA: Diagnosis not present

## 2015-09-13 DIAGNOSIS — G40919 Epilepsy, unspecified, intractable, without status epilepticus: Secondary | ICD-10-CM | POA: Diagnosis not present

## 2015-09-13 DIAGNOSIS — J690 Pneumonitis due to inhalation of food and vomit: Secondary | ICD-10-CM | POA: Diagnosis present

## 2015-09-13 DIAGNOSIS — G40909 Epilepsy, unspecified, not intractable, without status epilepticus: Secondary | ICD-10-CM

## 2015-09-13 DIAGNOSIS — D696 Thrombocytopenia, unspecified: Secondary | ICD-10-CM | POA: Diagnosis not present

## 2015-09-13 DIAGNOSIS — Z7951 Long term (current) use of inhaled steroids: Secondary | ICD-10-CM | POA: Diagnosis not present

## 2015-09-13 DIAGNOSIS — Z9889 Other specified postprocedural states: Secondary | ICD-10-CM | POA: Diagnosis not present

## 2015-09-13 DIAGNOSIS — Z888 Allergy status to other drugs, medicaments and biological substances status: Secondary | ICD-10-CM | POA: Diagnosis not present

## 2015-09-13 LAB — URINALYSIS COMPLETE WITH MICROSCOPIC (ARMC ONLY)
Bacteria, UA: NONE SEEN
Bilirubin Urine: NEGATIVE
Glucose, UA: NEGATIVE mg/dL
Leukocytes, UA: NEGATIVE
Nitrite: NEGATIVE
Protein, ur: NEGATIVE mg/dL
Specific Gravity, Urine: 1.016 (ref 1.005–1.030)
Squamous Epithelial / LPF: NONE SEEN
WBC, UA: NONE SEEN WBC/hpf (ref 0–5)
pH: 7 (ref 5.0–8.0)

## 2015-09-13 LAB — CBC
HCT: 34.7 % — ABNORMAL LOW (ref 40.0–52.0)
Hemoglobin: 12.3 g/dL — ABNORMAL LOW (ref 13.0–18.0)
MCH: 31.3 pg (ref 26.0–34.0)
MCHC: 35.3 g/dL (ref 32.0–36.0)
MCV: 88.4 fL (ref 80.0–100.0)
Platelets: 90 10*3/uL — ABNORMAL LOW (ref 150–440)
RBC: 3.92 MIL/uL — ABNORMAL LOW (ref 4.40–5.90)
RDW: 13.8 % (ref 11.5–14.5)
WBC: 4.9 10*3/uL (ref 3.8–10.6)

## 2015-09-13 LAB — COMPREHENSIVE METABOLIC PANEL
ALT: 7 U/L — ABNORMAL LOW (ref 17–63)
AST: 16 U/L (ref 15–41)
Albumin: 3.9 g/dL (ref 3.5–5.0)
Alkaline Phosphatase: 45 U/L (ref 38–126)
Anion gap: 10 (ref 5–15)
BUN: 13 mg/dL (ref 6–20)
CO2: 23 mmol/L (ref 22–32)
Calcium: 8.7 mg/dL — ABNORMAL LOW (ref 8.9–10.3)
Chloride: 94 mmol/L — ABNORMAL LOW (ref 101–111)
Creatinine, Ser: 0.37 mg/dL — ABNORMAL LOW (ref 0.61–1.24)
GFR calc Af Amer: >60 mL/min (ref >60)
GFR calc non Af Amer: >60 mL/min (ref >60)
Glucose, Bld: 80 mg/dL (ref 65–99)
Potassium: 4.3 mmol/L (ref 3.5–5.1)
Sodium: 127 mmol/L — ABNORMAL LOW (ref 135–145)
Total Bilirubin: 0.4 mg/dL (ref 0.3–1.2)
Total Protein: 6.6 g/dL (ref 6.5–8.1)

## 2015-09-13 LAB — BASIC METABOLIC PANEL
Anion gap: 7 (ref 5–15)
BUN: 12 mg/dL (ref 6–20)
CO2: 23 mmol/L (ref 22–32)
Calcium: 8.6 mg/dL — ABNORMAL LOW (ref 8.9–10.3)
Chloride: 97 mmol/L — ABNORMAL LOW (ref 101–111)
Creatinine, Ser: <0.3 mg/dL — ABNORMAL LOW (ref 0.61–1.24)
Glucose, Bld: 85 mg/dL (ref 65–99)
Potassium: 4.1 mmol/L (ref 3.5–5.1)
Sodium: 127 mmol/L — ABNORMAL LOW (ref 135–145)

## 2015-09-13 LAB — CORTISOL: Cortisol, Plasma: 15.5 ug/dL

## 2015-09-13 LAB — TSH
TSH: 0.557 u[IU]/mL (ref 0.350–4.500)
TSH: 0.465 u[IU]/mL (ref 0.350–4.500)

## 2015-09-13 LAB — VALPROIC ACID LEVEL: Valproic Acid Lvl: 116 ug/mL — ABNORMAL HIGH (ref 50.0–100.0)

## 2015-09-13 LAB — HEMOGLOBIN A1C: Hgb A1c MFr Bld: 4.4 % (ref 4.0–6.0)

## 2015-09-13 MED ORDER — MONTELUKAST SODIUM 10 MG PO TABS
10.0000 mg | ORAL_TABLET | Freq: Every day | ORAL | Status: DC
  Administered 2015-09-13: 21:00:00 10 mg via ORAL
  Filled 2015-09-13: qty 1

## 2015-09-13 MED ORDER — SODIUM CHLORIDE 0.9% FLUSH
3.0000 mL | Freq: Two times a day (BID) | INTRAVENOUS | Status: DC
  Administered 2015-09-13: 3 mL via INTRAVENOUS

## 2015-09-13 MED ORDER — MIDAZOLAM HCL 2 MG/2ML IJ SOLN
2.0000 mg | Freq: Once | INTRAMUSCULAR | Status: AC
  Administered 2015-09-13: 2 mg via INTRAVENOUS
  Filled 2015-09-13: qty 2

## 2015-09-13 MED ORDER — CLONAZEPAM 0.5 MG PO TABS
0.5000 mg | ORAL_TABLET | Freq: Three times a day (TID) | ORAL | Status: DC | PRN
  Filled 2015-09-13: qty 1

## 2015-09-13 MED ORDER — CETIRIZINE HCL 5 MG/5ML PO SYRP
15.0000 mg | ORAL_SOLUTION | Freq: Every day | ORAL | Status: DC
  Administered 2015-09-13 – 2015-09-14 (×2): 15 mg via ORAL
  Filled 2015-09-13 (×2): qty 15

## 2015-09-13 MED ORDER — IBUPROFEN 600 MG PO TABS
600.0000 mg | ORAL_TABLET | Freq: Once | ORAL | Status: AC
  Administered 2015-09-13: 600 mg via ORAL
  Filled 2015-09-13: qty 1

## 2015-09-13 MED ORDER — LORAZEPAM 2 MG/ML IJ SOLN
INTRAMUSCULAR | Status: AC
  Administered 2015-09-13: 2 mg via INTRAVENOUS
  Filled 2015-09-13: qty 1

## 2015-09-13 MED ORDER — VALPROATE SODIUM 250 MG/5ML PO SOLN
500.0000 mg | Freq: Two times a day (BID) | ORAL | Status: DC
  Administered 2015-09-13 – 2015-09-14 (×3): 500 mg via ORAL
  Filled 2015-09-13 (×4): qty 10

## 2015-09-13 MED ORDER — ACETAMINOPHEN 325 MG PO TABS
650.0000 mg | ORAL_TABLET | Freq: Four times a day (QID) | ORAL | Status: DC | PRN
Start: 2015-09-13 — End: 2015-09-14
  Administered 2015-09-13: 650 mg via ORAL
  Filled 2015-09-13: qty 2

## 2015-09-13 MED ORDER — SODIUM CHLORIDE 0.9 % IV SOLN
INTRAVENOUS | Status: DC
  Administered 2015-09-13 – 2015-09-14 (×4): via INTRAVENOUS

## 2015-09-13 MED ORDER — TOPIRAMATE 100 MG PO TABS
200.0000 mg | ORAL_TABLET | Freq: Two times a day (BID) | ORAL | Status: DC
  Administered 2015-09-13 – 2015-09-14 (×3): 200 mg
  Filled 2015-09-13 (×4): qty 2

## 2015-09-13 MED ORDER — GUANFACINE HCL 1 MG PO TABS
1.0000 mg | ORAL_TABLET | Freq: Every day | ORAL | Status: DC
  Administered 2015-09-13: 1 mg
  Filled 2015-09-13 (×2): qty 1

## 2015-09-13 MED ORDER — OXCARBAZEPINE 300 MG/5ML PO SUSP
600.0000 mg | Freq: Two times a day (BID) | ORAL | Status: DC
  Administered 2015-09-13 – 2015-09-14 (×3): 600 mg via ORAL
  Filled 2015-09-13 (×5): qty 10

## 2015-09-13 MED ORDER — FLUTICASONE PROPIONATE 50 MCG/ACT NA SUSP
2.0000 | Freq: Every day | NASAL | Status: DC
  Administered 2015-09-13 – 2015-09-14 (×2): 2 via NASAL
  Filled 2015-09-13: qty 16

## 2015-09-13 MED ORDER — SODIUM CHLORIDE 0.9 % IV BOLUS (SEPSIS)
1000.0000 mL | Freq: Once | INTRAVENOUS | Status: AC
  Administered 2015-09-13: 1000 mL via INTRAVENOUS

## 2015-09-13 MED ORDER — SODIUM CHLORIDE 0.9 % IV SOLN
1000.0000 mg | Freq: Once | INTRAVENOUS | Status: AC
  Administered 2015-09-13: 1000 mg via INTRAVENOUS
  Filled 2015-09-13: qty 10

## 2015-09-13 MED ORDER — ONDANSETRON HCL 4 MG PO TABS: 4.0000 mg | ORAL_TABLET | Freq: Four times a day (QID) | ORAL | Status: DC | PRN

## 2015-09-13 MED ORDER — CLOBAZAM 2.5 MG/ML PO SUSP
15.0000 mg | Freq: Two times a day (BID) | ORAL | Status: DC
  Administered 2015-09-14: 15 mg
  Filled 2015-09-13: qty 8

## 2015-09-13 MED ORDER — LORAZEPAM 2 MG/ML IJ SOLN
2.0000 mg | Freq: Once | INTRAMUSCULAR | Status: AC
  Administered 2015-09-13: 2 mg via INTRAVENOUS

## 2015-09-13 MED ORDER — CETYLPYRIDINIUM CHLORIDE 0.05 % MT LIQD
7.0000 mL | Freq: Two times a day (BID) | OROMUCOSAL | Status: DC
  Administered 2015-09-13 – 2015-09-14 (×3): 7 mL via OROMUCOSAL

## 2015-09-13 MED ORDER — LEVOCARNITINE 1 GM/10ML PO SOLN
500.0000 mg | Freq: Two times a day (BID) | ORAL | Status: DC
  Administered 2015-09-13 – 2015-09-14 (×3): 500 mg via ORAL
  Filled 2015-09-13 (×5): qty 5

## 2015-09-13 MED ORDER — ALBUTEROL SULFATE (2.5 MG/3ML) 0.083% IN NEBU: 2.5000 mg | INHALATION_SOLUTION | Freq: Four times a day (QID) | RESPIRATORY_TRACT | Status: DC | PRN

## 2015-09-13 MED ORDER — ACETAMINOPHEN 650 MG RE SUPP: 650.0000 mg | Freq: Four times a day (QID) | RECTAL | Status: DC | PRN

## 2015-09-13 MED ORDER — ONDANSETRON HCL 4 MG/2ML IJ SOLN: 4.0000 mg | Freq: Four times a day (QID) | INTRAMUSCULAR | Status: DC | PRN

## 2015-09-13 NOTE — H&P (Signed)
Noah Marshall is an 20 y.o. male.   Chief Complaint: Seizures HPI: The patient with past medical history of autism and epilepsy presents to the emergency department via EMS after suffering 6 seizures at home. It is common for him to have a seizure once per day or every other day but today the patient has been more agitated. Yesterday his parents note that he did not seem to be acting himself. They deny fever, nausea, vomiting or diarrhea. Following his sixth seizure today he was transported to the emergency department for evaluation where he had at least 2 more witnessed seizures. He was given multiple doses of benzodiazepines. Valproic acid level was found to be elevated, and serum sodium found to be low. In recent months some of his medication doses have been adjusted but he did not tolerate those well. Now his mom reports that he is on stable doses of antiepileptic medication until the seizure activity seen today. Due to intractable seizures the emergency department staff called the hospitalist service for admission.  Past Medical History:  Diagnosis Date  . Aspiration pneumonia ([place])   . Autism   . Epilepsy ([place])   . Epilepsy with status epilepticus ([place])   . Intracranial arachnoid cysts   . Pes planovalgus   . Premature ventricular contractions   . Respiratory failure [facility])     Past Surgical History:  Procedure Laterality Date  . FOREIGN BODY REMOVAL N/A 03/08/2015   Procedure: FOREIGN BODY REMOVAL;  Surgeon: Josefine Class, MD;  Location: [facility] ENDOSCOPY;  Service: Endoscopy;  Laterality: N/A;  . IMPLANTATION VAGAL NERVE STIMULATOR  2011    Family History  Problem Relation Age of Onset  . Diabetes Mellitus II Father   . Hypertension Father    Social History:  reports that he has never smoked. He has never used smokeless tobacco. His alcohol and drug histories are not on file.  Allergies:  Allergies  Allergen Reactions  . Lactose Intolerance (Gi)   . Peanut-Containing Drug  Products     Medications Prior to Admission  Medication Sig Dispense Refill  . albuterol (PROVENTIL) (2.5 MG/3ML) 0.083% nebulizer solution Take 2.5 mg by nebulization every 6 (six) hours as needed for wheezing or shortness of breath.    . cetirizine (ZYRTEC) 1 MG/ML syrup Take 15 mg by mouth daily.    . cloBAZam (ONFI) 2.5 MG/ML solution Place 15 mg into feeding tube 2 (two) times daily.     . clonazePAM (KLONOPIN) 0.5 MG tablet Take 0.5 mg by mouth 3 (three) times daily as needed for anxiety.    . fluticasone (FLONASE) 50 MCG/ACT nasal spray Place 2 sprays into both nostrils daily.    Marland Kitchen guanFACINE (TENEX) 1 MG tablet Place 1 mg into feeding tube at bedtime.     . levOCARNitine (CARNITOR) 1 GM/10ML solution Take 500 mg by mouth 2 (two) times daily.    . montelukast (SINGULAIR) 10 MG tablet Take 10 mg by mouth at bedtime.    . OXcarbazepine (TRILEPTAL) 300 MG/5ML suspension Take 600 mg by mouth 2 (two) times daily.     Marland Kitchen topiramate (TOPAMAX) 200 MG tablet Place 200 mg into feeding tube 2 (two) times daily.     Marland Kitchen valproic acid (DEPAKENE) 250 MG/5ML syrup Take 500 mg by mouth 2 (two) times daily.      Results for orders placed or performed during the hospital encounter of 09/12/15 (from the past 48 hour(s))  CBC     Status: Abnormal  Collection Time: 09/12/15 11:51 PM  Result Value Ref Range   WBC 4.9 3.8 - 10.6 K/uL   RBC 3.92 (L) 4.40 - 5.90 MIL/uL   Hemoglobin 12.3 (L) 13.0 - 18.0 g/dL   HCT 34.7 (L) 40.0 - 52.0 %   MCV 88.4 80.0 - 100.0 fL   MCH 31.3 26.0 - 34.0 pg   MCHC 35.3 32.0 - 36.0 g/dL   RDW 13.8 11.5 - 14.5 %   Platelets 90 (L) 150 - 440 K/uL  Comprehensive metabolic panel     Status: Abnormal   Collection Time: 09/12/15 11:51 PM  Result Value Ref Range   Sodium 127 (L) 135 - 145 mmol/L   Potassium 4.3 3.5 - 5.1 mmol/L   Chloride 94 (L) 101 - 111 mmol/L   CO2 23 22 - 32 mmol/L   Glucose, Bld 80 65 - 99 mg/dL   BUN 13 6 - 20 mg/dL   Creatinine, Ser 0.37 (L) 0.61 -  1.24 mg/dL   Calcium 8.7 (L) 8.9 - 10.3 mg/dL   Total Protein 6.6 6.5 - 8.1 g/dL   Albumin 3.9 3.5 - 5.0 g/dL   AST 16 15 - 41 U/L   ALT 7 (L) 17 - 63 U/L   Alkaline Phosphatase 45 38 - 126 U/L   Total Bilirubin 0.4 0.3 - 1.2 mg/dL   GFR calc non Af Amer >60 >60 mL/min   GFR calc Af Amer >60 >60 mL/min    Comment: (NOTE) The eGFR has been calculated using the CKD EPI equation. This calculation has not been validated in all clinical situations. eGFR's persistently <60 mL/min signify possible Chronic Kidney Disease.    Anion gap 10 5 - 15  Valproic acid level     Status: Abnormal   Collection Time: 09/12/15 11:51 PM  Result Value Ref Range   Valproic Acid Lvl 116 (H) 50.0 - 100.0 ug/mL  Urinalysis complete, with microscopic ([facility] only)     Status: Abnormal   Collection Time: 09/13/15 12:42 AM  Result Value Ref Range   Color, Urine YELLOW (A) YELLOW   APPearance CLOUDY (A) CLEAR   Glucose, UA NEGATIVE NEGATIVE mg/dL   Bilirubin Urine NEGATIVE NEGATIVE   Ketones, ur TRACE (A) NEGATIVE mg/dL   Specific Gravity, Urine 1.016 1.005 - 1.030   Hgb urine dipstick 1+ (A) NEGATIVE   pH 7.0 5.0 - 8.0   Protein, ur NEGATIVE NEGATIVE mg/dL   Nitrite NEGATIVE NEGATIVE   Leukocytes, UA NEGATIVE NEGATIVE   RBC / HPF TOO NUMEROUS TO COUNT 0 - 5 RBC/hpf   WBC, UA NONE SEEN 0 - 5 WBC/hpf   Bacteria, UA NONE SEEN NONE SEEN   Squamous Epithelial / LPF NONE SEEN NONE SEEN   Ct Head Wo Contrast  Result Date: 09/13/2015 CLINICAL DATA:  Acute onset of 6 seizures.  Initial encounter. EXAM: CT HEAD WITHOUT CONTRAST TECHNIQUE: Contiguous axial images were obtained from the base of the skull through the vertex without intravenous contrast. COMPARISON:  CT of the head performed 12/27/2012 FINDINGS: Brain: No evidence of acute infarction, hemorrhage, hydrocephalus, extra-axial collection or mass lesion/mass effect. The posterior fossa, including the cerebellum, brainstem and fourth ventricle, is within  normal limits. The third and lateral ventricles, and basal ganglia are unremarkable in appearance. The cerebral hemispheres are symmetric in appearance, with normal gray-white differentiation. No mass effect or midline shift is seen. Vascular: No hyperdense vessel or unexpected calcification. Skull: The calvarium is grossly unremarkable. There is no evidence of fracture.  Sinuses/Orbits: The visualized paranasal sinuses and mastoid air cells are well-aerated. The orbits are not imaged on this study. Other: No significant soft tissue abnormalities are characterized. IMPRESSION: Unremarkable noncontrast CT of the head. Electronically Signed   By: Garald Balding M.D.   On: 09/13/2015 00:20   Dg Chest Portable 1 View  Result Date: 09/13/2015 CLINICAL DATA:  Acute onset of cough and seizures. Initial encounter. EXAM: PORTABLE CHEST 1 VIEW COMPARISON:  Chest radiograph from 03/08/2015 FINDINGS: The lungs are hypoexpanded. Vascular crowding is noted. Right basilar airspace opacity may reflect atelectasis or possibly pneumonia. No pleural effusion or pneumothorax is seen. The cardiomediastinal silhouette is normal in size. No acute osseous abnormalities are identified. A metallic device is noted overlying the left chest wall. IMPRESSION: Lungs hypoexpanded. Right basilar airspace opacity may reflect atelectasis or possibly pneumonia. Electronically Signed   By: Garald Balding M.D.   On: 09/13/2015 00:21    Review of Systems  Unable to perform ROS: Patient nonverbal  Neurological: Positive for seizures.    Blood pressure 131/76, pulse (!) 111, temperature 97.8 F (36.6 C), temperature source Oral, resp. rate 19, height 5' 7"  (1.702 m), weight 82.4 kg (181 lb 9.6 oz), SpO2 93 %. Physical Exam  Constitutional: He appears well-developed and well-nourished. No distress.  HENT:  Head: Normocephalic and atraumatic.  Mouth/Throat: Oropharynx is clear and moist.  Eyes: Conjunctivae are normal. Pupils are equal,  round, and reactive to light. No scleral icterus.  Strabismus  Neck: Normal range of motion. Neck supple. No JVD present. No tracheal deviation present. No thyromegaly present.  Cardiovascular: Regular rhythm and normal heart sounds.  Exam reveals no gallop and no friction rub.   No murmur heard. Tachycardia following periods of seizure activity  Respiratory: Effort normal and breath sounds normal. No respiratory distress. He has no wheezes.  GI: Soft. Bowel sounds are normal. He exhibits no distension. There is no tenderness.  Genitourinary:  Genitourinary Comments: Deferred  Musculoskeletal: He exhibits no edema.  Contractures of upper and lower extremities bilaterally  Lymphadenopathy:    He has no cervical adenopathy.  Neurological: He is alert. A cranial nerve deficit (Left LR 6) is present.  Skin: Skin is warm and dry. No rash noted. No erythema.  Psychiatric:  The patient is nonverbal; he does not appear distressed     Assessment/Plan This is a 20 year old male admitted for intractable seizures. 1. Seizure activity: Intractable; differential diagnosis includes hyponatremia, illness or encephalitis, change in metabolism of antiepileptic medicine, endocrine derangement, etc. The patient has been hyponatremic in the past without increased seizure activity. He does not have evidence of infection at this time nor has a change in his medication regimen occurred that might alter metabolism of his antiepileptic medications. Check TSH as well as serum cortisol. I have held his valproic acid at this time as he is thrombocytopenic (side effect of valproic acid therapy) and it does not seem that supratherapeutic levels would be causing increased seizures. I have loaded the patient with Keppra and we will continue his home regimen until further recommendations from neurology. 2. Autism: May be manifestation of developmental disorder with specific neurologic consultations 3. Hyponatremia: Subacute;  look for reversible causes. Hydrate with intravenous fluid. Encourage by mouth intake. 4. Thrombocytopenia: No bleeding diatheses 5. DVT prophylaxis: SCDs 6. GI prophylaxis: None The patient is a full code. Time spent on admission orders and patient care approximately 90 minutes  Harrie Foreman, MD 09/13/2015, 7:42 AM

## 2015-09-13 NOTE — ED Notes (Signed)
Mother reports patient had a seizure, Dr. Zenda AlpersWebster notified, verbal orders given by Dr. Zenda AlpersWebster. See MAR.

## 2015-09-13 NOTE — Consult Note (Signed)
Reason for Consult:Seizures Referring Physician: Mody  CC: Seizures  HPI: Noah Marshall is an 20 y.o. 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 It is common for him to have a seizure once per day or every other day but yesterday the patient had been more agitated.  Mother noted some hypoxia.  The day prior his parents noted that he did not seem to be acting himself as well. They deny fever, nausea, vomiting or diarrhea. Following his sixth seizure today he was transported to the emergency department for evaluation where he had at least 2 more witnessed seizures. He was given multiple doses of benzodiazepines.  Patient did well overnight but remains lethargic.  Has had adjustments in his AED's but has not tolerated this well.  Last change was an attempt to increase the Onfi but patient had side effects.   At home noverbal but ambulatory with assistance.    Past Medical History:  Diagnosis Date  . Aspiration pneumonia (HCC)   . Autism   . Epilepsy (HCC)   . Epilepsy with status epilepticus (HCC)   . Intracranial arachnoid cysts   . Pes planovalgus   . Premature ventricular contractions   . Respiratory failure [facility])     Past Surgical History:  Procedure Laterality Date  . FOREIGN BODY REMOVAL N/A 03/08/2015   Procedure: FOREIGN BODY REMOVAL;  Surgeon: Elnita Maxwell, MD;  Location: [facility] ENDOSCOPY;  Service: Endoscopy;  Laterality: N/A;  . IMPLANTATION VAGAL NERVE STIMULATOR  2011    Family History  Problem Relation Age of Onset  . Diabetes Mellitus II Father   . Hypertension Father     Social History:  reports that he has never smoked. He has never used smokeless tobacco. His alcohol and drug histories are not on file.  Allergies  Allergen Reactions  . Lactose Intolerance (Gi)   . Peanut-Containing Drug Products     Medications:  I have reviewed the patient's current  medications. Prior to Admission:  Prescriptions Prior to Admission  Medication Sig Dispense Refill Last Dose  . albuterol (PROVENTIL) (2.5 MG/3ML) 0.083% nebulizer solution Take 2.5 mg by nebulization every 6 (six) hours as needed for wheezing or shortness of breath.   prn at prn  . cetirizine (ZYRTEC) 1 MG/ML syrup Take 15 mg by mouth daily.   09/12/2015 at Unknown time  . cloBAZam (ONFI) 2.5 MG/ML solution Place 15 mg into feeding tube 2 (two) times daily.    09/12/2015 at Unknown time  . clonazePAM (KLONOPIN) 0.5 MG tablet Take 0.5 mg by mouth 3 (three) times daily as needed for anxiety.   09/12/2015 at Unknown time  . fluticasone (FLONASE) 50 MCG/ACT nasal spray Place 2 sprays into both nostrils daily.   03/07/2015 at Unknown time  . guanFACINE (TENEX) 1 MG tablet Place 1 mg into feeding tube at bedtime.    09/12/2015 at Unknown time  . levOCARNitine (CARNITOR) 1 GM/10ML solution Take 500 mg by mouth 2 (two) times daily.   09/12/2015 at Unknown time  . montelukast (SINGULAIR) 10 MG tablet Take 10 mg by mouth at bedtime.   09/12/2015 at Unknown time  . OXcarbazepine (TRILEPTAL) 300 MG/5ML suspension Take 600 mg by mouth 2 (two) times daily.    09/12/2015 at Unknown time  . topiramate (TOPAMAX) 200 MG tablet Place 200 mg into feeding tube 2 (two) times daily.    09/12/2015 at Unknown time  .  valproic acid (DEPAKENE) 250 MG/5ML syrup Take 500 mg by mouth 2 (two) times daily.   09/12/2015 at Unknown time   Scheduled: . antiseptic oral rinse  7 mL Mouth Rinse BID  . cetirizine HCl  15 mg Oral Daily  . cloBAZam  15 mg Per Tube BID  . fluticasone  2 spray Each Nare Daily  . guanFACINE  1 mg Per Tube QHS  . levOCARNitine  500 mg Oral BID  . montelukast  10 mg Oral QHS  . OXcarbazepine  600 mg Oral BID  . sodium chloride flush  3 mL Intravenous Q12H  . topiramate  200 mg Per Tube BID    ROS: Patient unable to provide-nonverbal  Physical Examination: Blood pressure 131/76, pulse (!) 111, temperature  97.8 F (36.6 C), temperature source Oral, resp. rate 19, height 5\' 7"  (1.702 m), weight 82.4 kg (181 lb 9.6 oz), SpO2 93 %.  HEENT-  Normocephalic, no lesions, without obvious abnormality.  Normal external eye and conjunctiva.  Normal TM's bilaterally.  Normal auditory canals and external ears. Normal external nose, mucus membranes and septum.  Normal pharynx. Cardiovascular- S1, S2 normal, pulses palpable throughout   Lungs- chest clear, no wheezing, rales, normal symmetric air entry Abdomen- soft, non-tender; bowel sounds normal; no masses,  no organomegaly Extremities- no edema Lymph-no adenopathy palpable Musculoskeletal-no joint tenderness, deformity or swelling Skin-warm and dry, no hyperpigmentation, vitiligo, or suspicious lesions  Neurological Examination Mental Status: Lethargic with covers pulled over his head.  Nonverbal.  Does not follow commands.   Cranial Nerves: II: Discs flat bilaterally; Pupils equal, round, reactive to light and accommodation III,IV, VI: Nystagmus noted on attempts to test EOM's V,VII: face symmetric VIII: hearing normal bilaterally IX,X: Unable to test XI: bilateral shoulder shrug XII: Unable to test Motor: Lifts both upper extremities above his head but does not cooperate for formal testing.  Only minimal movement noted in the lower extremities.   Sensory: Responds to light noxious stimuli throughout Cerebellar: Unable to test secondary to cooperation Gait: not tested due to safety concerns   Laboratory Studies:   Basic Metabolic Panel:  Recent Labs Lab 09/12/15 2351 09/13/15 0755  NA 127* 127*  K 4.3 4.1  CL 94* 97*  CO2 23 23  GLUCOSE 80 85  BUN 13 12  CREATININE 0.37* <0.30*  CALCIUM 8.7* 8.6*    Liver Function Tests:  Recent Labs Lab 09/12/15 2351  AST 16  ALT 7*  ALKPHOS 45  BILITOT 0.4  PROT 6.6  ALBUMIN 3.9   No results for input(s): LIPASE, AMYLASE in the last 168 hours. No results for input(s): AMMONIA in the  last 168 hours.  CBC:  Recent Labs Lab 09/12/15 2351  WBC 4.9  HGB 12.3*  HCT 34.7*  MCV 88.4  PLT 90*    Cardiac Enzymes: No results for input(s): CKTOTAL, CKMB, CKMBINDEX, TROPONINI in the last 168 hours.  BNP: Invalid input(s): POCBNP  CBG: No results for input(s): GLUCAP in the last 168 hours.  Microbiology: Results for orders placed or performed in visit on 12/26/12  Culture, blood (single)     Status: None   Collection Time: 12/27/12 12:12 AM  Result Value Ref Range Status   Micro Text Report   Final       COMMENT                   NO GROWTH AEROBICALLY/ANAEROBICALLY IN 5 DAYS   ANTIBIOTIC  Urine culture     Status: None   Collection Time: 12/27/12 12:12 AM  Result Value Ref Range Status   Micro Text Report   Final       SOURCE: INDWELLING CATHETER    COMMENT                   NO GROWTH IN 18-24 HOURS   ANTIBIOTIC                                                      Influenza A&B Antigens [facility])     Status: None   Collection Time: 12/27/12 12:12 AM  Result Value Ref Range Status   Micro Text Report   Final       SOURCE: SWAB    COMMENT                   NEGATIVE FOR INFLUENZA A (ANTIGEN ABSENT)   COMMENT                   NEGATIVE FOR INFLUENZA B (ANTIGEN ABSENT)   ANTIBIOTIC                                                      Culture, blood (single)     Status: None   Collection Time: 12/27/12  2:52 AM  Result Value Ref Range Status   Micro Text Report   Final       COMMENT                   NO GROWTH AEROBICALLY/ANAEROBICALLY IN 5 DAYS   ANTIBIOTIC                                                      Culture, expectorated sputum-assessment     Status: None   Collection Time: 12/27/12  2:52 AM  Result Value Ref Range Status   Micro Text Report   Final       ORGANISM 1                HEAVY GROWTH MORAXELLA (BRANHAMELLA) CATARRHALIS   GRAM STAIN                EXCELLENT  SPECIMEN-90-100% WBC   GRAM STAIN                MANY WHITE BLOOD CELLS   GRAM STAIN                MANY GRAM POSITIVE COCCI IN PAIRS   GRAM STAIN                FEW GRAM POSITIVE ROD   ANTIBIOTIC                    ORG#1     BETA-LACTAMASE                POSITIVE      Coagulation Studies: No results for input(s): LABPROT, INR in  the last 72 hours.  Urinalysis:  Recent Labs Lab 09/13/15 0042  COLORURINE YELLOW*  LABSPEC 1.016  PHURINE 7.0  GLUCOSEU NEGATIVE  HGBUR 1+*  BILIRUBINUR NEGATIVE  KETONESUR TRACE*  PROTEINUR NEGATIVE  NITRITE NEGATIVE  LEUKOCYTESUR NEGATIVE    Lipid Panel:  No results found for: CHOL, TRIG, HDL, CHOLHDL, VLDL, LDLCALC  HgbA1C: No results found for: HGBA1C  Urine Drug Screen:  No results found for: LABOPIA, COCAINSCRNUR, LABBENZ, AMPHETMU, THCU, LABBARB  Alcohol Level: No results for input(s): ETH in the last 168 hours.   Imaging: Ct Head Wo Contrast  Result Date: 09/13/2015 CLINICAL DATA:  Acute onset of 6 seizures.  Initial encounter. EXAM: CT HEAD WITHOUT CONTRAST TECHNIQUE: Contiguous axial images were obtained from the base of the skull through the vertex without intravenous contrast. COMPARISON:  CT of the head performed 12/27/2012 FINDINGS: Brain: No evidence of acute infarction, hemorrhage, hydrocephalus, extra-axial collection or mass lesion/mass effect. The posterior fossa, including the cerebellum, brainstem and fourth ventricle, is within normal limits. The third and lateral ventricles, and basal ganglia are unremarkable in appearance. The cerebral hemispheres are symmetric in appearance, with normal gray-white differentiation. No mass effect or midline shift is seen. Vascular: No hyperdense vessel or unexpected calcification. Skull: The calvarium is grossly unremarkable. There is no evidence of fracture. Sinuses/Orbits: The visualized paranasal sinuses and mastoid air cells are well-aerated. The orbits are not imaged on this study.  Other: No significant soft tissue abnormalities are characterized. IMPRESSION: Unremarkable noncontrast CT of the head. Electronically Signed   By: Roanna RaiderJeffery  Chang M.D.   On: 09/13/2015 00:20   Dg Chest Portable 1 View  Result Date: 09/13/2015 CLINICAL DATA:  Acute onset of cough and seizures. Initial encounter. EXAM: PORTABLE CHEST 1 VIEW COMPARISON:  Chest radiograph from 03/08/2015 FINDINGS: The lungs are hypoexpanded. Vascular crowding is noted. Right basilar airspace opacity may reflect atelectasis or possibly pneumonia. No pleural effusion or pneumothorax is seen. The cardiomediastinal silhouette is normal in size. No acute osseous abnormalities are identified. A metallic device is noted overlying the left chest wall. IMPRESSION: Lungs hypoexpanded. Right basilar airspace opacity may reflect atelectasis or possibly pneumonia. Electronically Signed   By: Roanna RaiderJeffery  Chang M.D.   On: 09/13/2015 00:21     Assessment/Plan: 20 year old male with a history of autism and epilepsy.  Currently on Depakote, Trileptal, Topamax, Klonopin and Onfi. Patient also with VNS and seizures remain intractable.  Dose adjustments have been attempted with side effects noted.  Depakote level 116 on admission.  Although high not at a point that would consider holding or changing dose.  Patient now stable without further seizures.  Recommendations: 1.  Restart Depakote at home dose. Will not change other medications at this time.   2.  PT to evaluate patient for ambulation later today 3.  Continue seizure precautions with Ativan prn seizure activity.   4.  Patient to be discharge to home once back to baseline 5.  Mother to contact outpatient neurologist who has been taking care of seizures.    Thana FarrLeslie Curtiss Mahmood, MD Neurology [phone] 09/13/2015, 11:27 AM

## 2015-09-13 NOTE — Plan of Care (Signed)
Problem: Education: Goal: Knowledge of [facility] Education information/materials will improve Outcome: Progressing Pts VSS, no seizure activity throughout shift. Family at bedside at all times. Will continue to monitor.   Problem: Safety: Goal: Ability to remain free from injury will improve Outcome: Progressing No fall or injury throughout shift.   Problem: Pain Managment: Goal: General experience of comfort will improve Outcome: Progressing No with no s/s of pain  Problem: Skin Integrity: Goal: Risk for impaired skin integrity will decrease Outcome: Progressing No skin breakdown at this time. Pt repositioning independently. Will continue to monitor.   Problem: Activity: Goal: Risk for activity intolerance will decrease Outcome: Progressing Pt drowsy and unable to get OOB, PT to see pt soon. Family reports pt is ambulatory at home with no assistance.   Problem: Nutrition: Goal: Adequate nutrition will be maintained Outcome: Not Progressing Pt with poor PO intake, no appetite and minimal food intake throughout shift. Pt receiving IV fluids at this time. Will continue to monitor.   Problem: Physical Regulation: Goal: Diagnostic test results will improve Outcome: Progressing No seizure activity throughout shift, family remains at bedside at all times.

## 2015-09-13 NOTE — Care Management (Addendum)
Admitted to [facility] with the diagnosis of epilepsy. Lives with parents Belenda CruiseKristin and Alinda Moneyony [phone]). [facility] 2014. No skilled facility. No home oxygen. Uses no aids for ambulation. Takes care of all basic needs himself, doesn't drive. No falls. Good appetite. Uses pharmacy [facility]. Last seen Dr. Sherrie MustacheFisher a year ago. Last seen [facility], L[facility]ane at [facility] in April. Parents will transport. Gwenette GreetBrenda S Krystn Dermody RN MSN CCM Care management [phone]

## 2015-09-13 NOTE — Progress Notes (Signed)
[facility] -  at [facility]   PATIENT NAME: Noah Marshall    MR#:  [id number]  DATE OF BIRTH:  05/29/1995  SUBJECTIVE:    Agents mother bedside. Apparently patient has had 6 seizures at home. Time patient ambulates but is nonverbal.   REVIEW OF SYSTEMS:    Review of Systems  Unable to perform ROS: Patient nonverbal    Tolerating Diet: yes      DRUG ALLERGIES:   Allergies  Allergen Reactions  . Lactose Intolerance (Gi)   . Peanut-Containing Drug Products     VITALS:  Blood pressure 131/76, pulse (!) 111, temperature 97.8 F (36.6 C), temperature source Oral, resp. rate 19, height 5\' 7"  (1.702 m), weight 82.4 kg (181 lb 9.6 oz), SpO2 93 %.  PHYSICAL EXAMINATION:   Physical Exam  Constitutional: He is well-developed, well-nourished, and in no distress. No distress.  HENT:  Head: Normocephalic.  Eyes: No scleral icterus.  Neck: Normal range of motion. Neck supple. No JVD present. No tracheal deviation present.  Cardiovascular: Normal rate, regular rhythm and normal heart sounds.  Exam reveals no gallop and no friction rub.   No murmur heard. Pulmonary/Chest: Effort normal and breath sounds normal. No respiratory distress. He has no wheezes. He has no rales. He exhibits no tenderness.  Abdominal: Soft. Bowel sounds are normal. He exhibits no distension and no mass. There is no tenderness. There is no rebound and no guarding.  Musculoskeletal: Normal range of motion. He exhibits no edema.  Claw hands  Neurological: He is alert.  Skin: Skin is warm. No rash noted. No erythema.      LABORATORY PANEL:   CBC  Recent Labs Lab 09/12/15 2351  WBC 4.9  HGB 12.3*  HCT 34.7*  PLT 90*   ------------------------------------------------------------------------------------------------------------------  Chemistries   Recent Labs Lab 09/12/15 2351 09/13/15 0755  NA 127* 127*  K 4.3 4.1  CL 94* 97*  CO2 23 23  GLUCOSE 80 85  BUN 13 12   CREATININE 0.37* <0.30*  CALCIUM 8.7* 8.6*  AST 16  --   ALT 7*  --   ALKPHOS 45  --   BILITOT 0.4  --    ------------------------------------------------------------------------------------------------------------------  Cardiac Enzymes No results for input(s): TROPONINI in the last 168 hours. ------------------------------------------------------------------------------------------------------------------  RADIOLOGY:  Ct Head Wo Contrast  Result Date: 09/13/2015 CLINICAL DATA:  Acute onset of 6 seizures.  Initial encounter. EXAM: CT HEAD WITHOUT CONTRAST TECHNIQUE: Contiguous axial images were obtained from the base of the skull through the vertex without intravenous contrast. COMPARISON:  CT of the head performed 12/27/2012 FINDINGS: Brain: No evidence of acute infarction, hemorrhage, hydrocephalus, extra-axial collection or mass lesion/mass effect. The posterior fossa, including the cerebellum, brainstem and fourth ventricle, is within normal limits. The third and lateral ventricles, and basal ganglia are unremarkable in appearance. The cerebral hemispheres are symmetric in appearance, with normal gray-white differentiation. No mass effect or midline shift is seen. Vascular: No hyperdense vessel or unexpected calcification. Skull: The calvarium is grossly unremarkable. There is no evidence of fracture. Sinuses/Orbits: The visualized paranasal sinuses and mastoid air cells are well-aerated. The orbits are not imaged on this study. Other: No significant soft tissue abnormalities are characterized. IMPRESSION: Unremarkable noncontrast CT of the head. Electronically Signed   By: Roanna RaiderJeffery  Chang M.D.   On: 09/13/2015 00:20   Dg Chest Portable 1 View  Result Date: 09/13/2015 CLINICAL DATA:  Acute onset of cough and seizures. Initial encounter. EXAM: PORTABLE CHEST 1 VIEW  COMPARISON:  Chest radiograph from 03/08/2015 FINDINGS: The lungs are hypoexpanded. Vascular crowding is noted. Right basilar  airspace opacity may reflect atelectasis or possibly pneumonia. No pleural effusion or pneumothorax is seen. The cardiomediastinal silhouette is normal in size. No acute osseous abnormalities are identified. A metallic device is noted overlying the left chest wall. IMPRESSION: Lungs hypoexpanded. Right basilar airspace opacity may reflect atelectasis or possibly pneumonia. Electronically Signed   By: Roanna RaiderJeffery  Chang M.D.   On: 09/13/2015 00:21     ASSESSMENT AND PLAN:     20 year old male with a history of autism and epilepsy who presented with status epilepticus.  1. Status epilepticus: Patient had 6 seizures within 24 hours. Patient is on multiple seizure medications and has vagal nerve stimulator. Discuss case with Dr. Thad Rangereynolds. At this time there is no further recommendations for change in medications. Neck signed continue to monitor overnight for seizure. Cautions and will place patient on aspiration precautions. Depakote level elevated on hold for now.   2. Hyponatremia: This could be etiology of seizures. Continue IV fluids and recheck BMP in a.m.  3. Autism: Patient is at baseline. Aspiration precautions.  4. Thrombocytopenia: Continue to monitor platelet count.  Management plans discussed with the patient's mother and she is in agreement.  CODE STATUS: full  TOTAL TIME TAKING CARE OF THIS PATIENT: 30 minutes.     POSSIBLE D/C tomorrow, DEPENDING ON CLINICAL CONDITION.   Aljean Horiuchi M.D on 09/13/2015 at 11:24 AM  Between 7am to 6pm - Pager - [phone] After 6pm go to [email or web address] - [email or web address]  [facility]  Office  [phone]  CC: Primary care physician; Mila Merryonald Fisher, MD  Note: This dictation was prepared with Dragon dictation along with smaller phrase technology. Any transcriptional errors that result from this process are unintentional.

## 2015-09-14 ENCOUNTER — Inpatient Hospital Stay: Payer: 59

## 2015-09-14 DIAGNOSIS — R569 Unspecified convulsions: Secondary | ICD-10-CM

## 2015-09-14 LAB — BASIC METABOLIC PANEL
Anion gap: 7 (ref 5–15)
BUN: 11 mg/dL (ref 6–20)
CO2: 20 mmol/L — ABNORMAL LOW (ref 22–32)
Calcium: 8.1 mg/dL — ABNORMAL LOW (ref 8.9–10.3)
Chloride: 99 mmol/L — ABNORMAL LOW (ref 101–111)
Creatinine, Ser: 0.36 mg/dL — ABNORMAL LOW (ref 0.61–1.24)
GFR calc Af Amer: >60 mL/min (ref >60)
GFR calc non Af Amer: >60 mL/min (ref >60)
Glucose, Bld: 67 mg/dL (ref 65–99)
Potassium: 3.6 mmol/L (ref 3.5–5.1)
Sodium: 126 mmol/L — ABNORMAL LOW (ref 135–145)

## 2015-09-14 LAB — MRSA PCR SCREENING: MRSA by PCR: NEGATIVE

## 2015-09-14 MED ORDER — VANCOMYCIN HCL 10 G IV SOLR
1250.0000 mg | Freq: Once | INTRAVENOUS | Status: AC
  Administered 2015-09-14: 03:00:00 1250 mg via INTRAVENOUS
  Filled 2015-09-14: qty 1250

## 2015-09-14 MED ORDER — PIPERACILLIN-TAZOBACTAM 3.375 G IVPB
3.3750 g | Freq: Three times a day (TID) | INTRAVENOUS | Status: DC
  Administered 2015-09-14 (×2): 3.375 g via INTRAVENOUS
  Filled 2015-09-14 (×5): qty 50

## 2015-09-14 MED ORDER — VALPROATE SODIUM 250 MG/5ML PO SOLN: 500.0000 mg | Freq: Two times a day (BID) | ORAL | Status: DC

## 2015-09-14 MED ORDER — AMOXICILLIN-POT CLAVULANATE 400-57 MG/5ML PO SUSR: 875.0000 mg | Freq: Two times a day (BID) | ORAL | 0 refills | Status: AC

## 2015-09-14 MED ORDER — VANCOMYCIN HCL 10 G IV SOLR
1250.0000 mg | Freq: Three times a day (TID) | INTRAVENOUS | Status: DC
  Administered 2015-09-14: 09:00:00 1250 mg via INTRAVENOUS
  Filled 2015-09-14 (×4): qty 1250

## 2015-09-14 MED ORDER — ACETAMINOPHEN 650 MG RE SUPP
650.0000 mg | Freq: Once | RECTAL | Status: AC
  Administered 2015-09-14: 650 mg via RECTAL
  Filled 2015-09-14: qty 1

## 2015-09-14 NOTE — Plan of Care (Signed)
Problem: Pain Managment: Goal: General experience of comfort will improve Outcome: Not Progressing Pt febrile earlier on the shift. Tylenol given without relief. MD notified Ibuprofen given with no relief. MD notified again. Tylenol once time dose, Blood cultures x 2. Portable CXR. Once chest xray results came back Pharmacy consult for dosing of Vancomycin and Zosyn. Pt temperature decreased to 99.9 axillary after last dose of Tylenol.   Problem: Physical Regulation: Goal: Will remain free from infection Outcome: Not Progressing Iv antibiotics initiated. Will continue to monitor.   Problem: Physical Regulation: Goal: Diagnostic test results will improve Outcome: Not Progressing CXR showed edema or pneumonia. Blood culture results pending.

## 2015-09-14 NOTE — Progress Notes (Signed)
Subjective: Patient more alert today but still not back to baseline in terms of alertness. No further seizures noted.    Objective: Current vital signs: BP 103/61 (BP Location: Right Arm)   Pulse 76   Temp 99.2 F (37.3 C) (Oral)   Resp 18   Ht 5\' 7"  (1.702 m)   Wt 83.1 kg (183 lb 1.6 oz)   SpO2 99%   BMI 28.68 kg/m  Vital signs in last 24 hours: Temp:  [98.3 F (36.8 C)-101.1 F (38.4 C)] 99.2 F (37.3 C) (08/17 0543) Pulse Rate:  [76-114] 76 (08/17 0543) Resp:  [18-20] 18 (08/17 0543) BP: (103-119)/(61-79) 103/61 (08/17 0543) SpO2:  [96 %-99 %] 99 % (08/17 0543) Weight:  [83.1 kg (183 lb 1.6 oz)] 83.1 kg (183 lb 1.6 oz) (08/17 0500)  Intake/Output from previous day: 08/16 0701 - 08/17 0700 In: 3016.2 [I.V.:2716.2; IV Piggyback:300] Out: -  Intake/Output this shift: No intake/output data recorded. Nutritional status: DIET - DYS 1 Room service appropriate? Yes; Fluid consistency: Thin  Neurologic Exam: Mental Status: Lethargic with covers pulled over his head.  Nonverbal.  Does not follow commands. Opens eyes when covers pulled off and moves around. Cranial Nerves: II: Discs flat bilaterally; Pupils equal, round, reactive to light and accommodation III,IV, VI: Nystagmus noted on attempts to test EOM's V,VII: face symmetric VIII: hearing normal bilaterally IX,X: Unable to test XI: bilateral shoulder shrug XII: Unable to test Motor: Lifts both upper extremities above his head but does not cooperate for formal testing.  Only minimal movement noted in the lower extremities.     Lab Results: Basic Metabolic Panel:  Recent Labs Lab 09/12/15 2351 09/13/15 0755  NA 127* 127*  K 4.3 4.1  CL 94* 97*  CO2 23 23  GLUCOSE 80 85  BUN 13 12  CREATININE 0.37* <0.30*  CALCIUM 8.7* 8.6*    Liver Function Tests:  Recent Labs Lab 09/12/15 2351  AST 16  ALT 7*  ALKPHOS 45  BILITOT 0.4  PROT 6.6  ALBUMIN 3.9   No results for input(s): LIPASE, AMYLASE in the  last 168 hours. No results for input(s): AMMONIA in the last 168 hours.  CBC:  Recent Labs Lab 09/12/15 2351  WBC 4.9  HGB 12.3*  HCT 34.7*  MCV 88.4  PLT 90*    Cardiac Enzymes: No results for input(s): CKTOTAL, CKMB, CKMBINDEX, TROPONINI in the last 168 hours.  Lipid Panel: No results for input(s): CHOL, TRIG, HDL, CHOLHDL, VLDL, LDLCALC in the last 168 hours.  CBG: No results for input(s): GLUCAP in the last 168 hours.  Microbiology: Results for orders placed or performed during the hospital encounter of 09/12/15  CULTURE, BLOOD (ROUTINE X 2) w Reflex to ID Panel     Status: None (Preliminary result)   Collection Time: 09/14/15  1:00 AM  Result Value Ref Range Status   Specimen Description BLOOD RIGHT WRIST  Final   Special Requests   Final    BOTTLES DRAWN AEROBIC AND ANAEROBIC AER 6ML ANA .5ML   Culture NO GROWTH < 12 HOURS  Final   Report Status PENDING  Incomplete  CULTURE, BLOOD (ROUTINE X 2) w Reflex to ID Panel     Status: None (Preliminary result)   Collection Time: 09/14/15  5:20 AM  Result Value Ref Range Status   Specimen Description BLOOD LEFT HAND  Final   Special Requests BOTTLES DRAWN AEROBIC AND ANAEROBIC AER 4ML ANA 1  Final   Culture NO GROWTH < 12  HOURS  Final   Report Status PENDING  Incomplete    Coagulation Studies: No results for input(s): LABPROT, INR in the last 72 hours.  Imaging: Ct Head Wo Contrast  Result Date: 09/13/2015 CLINICAL DATA:  Acute onset of 6 seizures.  Initial encounter. EXAM: CT HEAD WITHOUT CONTRAST TECHNIQUE: Contiguous axial images were obtained from the base of the skull through the vertex without intravenous contrast. COMPARISON:  CT of the head performed 12/27/2012 FINDINGS: Brain: No evidence of acute infarction, hemorrhage, hydrocephalus, extra-axial collection or mass lesion/mass effect. The posterior fossa, including the cerebellum, brainstem and fourth ventricle, is within normal limits. The third and lateral  ventricles, and basal ganglia are unremarkable in appearance. The cerebral hemispheres are symmetric in appearance, with normal gray-white differentiation. No mass effect or midline shift is seen. Vascular: No hyperdense vessel or unexpected calcification. Skull: The calvarium is grossly unremarkable. There is no evidence of fracture. Sinuses/Orbits: The visualized paranasal sinuses and mastoid air cells are well-aerated. The orbits are not imaged on this study. Other: No significant soft tissue abnormalities are characterized. IMPRESSION: Unremarkable noncontrast CT of the head. Electronically Signed   By: Roanna RaiderJeffery  Chang M.D.   On: 09/13/2015 00:20   Dg Chest Port 1 View  Result Date: 09/14/2015 CLINICAL DATA:  Fever. EXAM: PORTABLE CHEST 1 VIEW COMPARISON:  07/14/2015 FINDINGS: Generator pack in the left chest with lead extending into the cervical region. Shallow inspiration. Heart size and pulmonary vascularity are normal. Bilateral perihilar infiltration may indicate edema or pneumonia. No blunting of costophrenic angles. No pneumothorax. IMPRESSION: Bilateral perihilar infiltration could indicate edema or pneumonia. Electronically Signed   By: Burman NievesWilliam  Stevens M.D.   On: 09/14/2015 01:17   Dg Chest Portable 1 View  Result Date: 09/13/2015 CLINICAL DATA:  Acute onset of cough and seizures. Initial encounter. EXAM: PORTABLE CHEST 1 VIEW COMPARISON:  Chest radiograph from 03/08/2015 FINDINGS: The lungs are hypoexpanded. Vascular crowding is noted. Right basilar airspace opacity may reflect atelectasis or possibly pneumonia. No pleural effusion or pneumothorax is seen. The cardiomediastinal silhouette is normal in size. No acute osseous abnormalities are identified. A metallic device is noted overlying the left chest wall. IMPRESSION: Lungs hypoexpanded. Right basilar airspace opacity may reflect atelectasis or possibly pneumonia. Electronically Signed   By: Roanna RaiderJeffery  Chang M.D.   On: 09/13/2015 00:21     Medications:  I have reviewed the patient's current medications. Scheduled: . antiseptic oral rinse  7 mL Mouth Rinse BID  . cetirizine HCl  15 mg Oral Daily  . cloBAZam  15 mg Per Tube BID  . fluticasone  2 spray Each Nare Daily  . guanFACINE  1 mg Per Tube QHS  . levOCARNitine  500 mg Oral BID  . montelukast  10 mg Oral QHS  . OXcarbazepine  600 mg Oral BID  . piperacillin-tazobactam (ZOSYN)  IV  3.375 g Intravenous Q8H  . sodium chloride flush  3 mL Intravenous Q12H  . topiramate  200 mg Per Tube BID  . Valproate Sodium  500 mg Oral BID  . vancomycin  1,250 mg Intravenous Q8H    Assessment/Plan: No further seizures noted.  Did spike a fever overnight which is likely attributable to aspiration since patient had multiple seizures at presentation.    Recommendations: 1.  Continue current AED's.     LOS: 1 day   Thana FarrLeslie Dara Camargo, MD Neurology [phone] 09/14/2015  11:37 AM

## 2015-09-14 NOTE — Discharge Summary (Signed)
[facility] [place] [facility]   PATIENT NAME: Noah Marshall    MR#:  [id number]  DATE OF BIRTH:  05/27/1995  DATE OF ADMISSION:  09/12/2015 ADMITTING PHYSICIAN: Arnaldo NatalMichael S Diamond, MD  DATE OF DISCHARGE: 09/14/2015  PRIMARY CARE PHYSICIAN: Mila Merryonald Fisher, MD    ADMISSION DIAGNOSIS:  Hyponatremia [E87.1] Seizure (HCC) [R56.9] Community acquired pneumonia [J18.9]  DISCHARGE DIAGNOSIS:  Active Problems:   Epilepsy (HCC)   SECONDARY DIAGNOSIS:   Past Medical History:  Diagnosis Date  . Aspiration pneumonia (HCC)   . Autism   . Epilepsy (HCC)   . Epilepsy with status epilepticus (HCC)   . Intracranial arachnoid cysts   . Pes planovalgus   . Premature ventricular contractions   . Respiratory failure [facility](HCC)     HOSPITAL COURSE:   20 year old male with a history of autism and epilepsy who presented with status epilepticus.  1. Status epilepticus:Patient was placed on seizure precautions. Patient was evaluated by Dr. Thad Rangereynolds. Patient is on several medications for seizures and also has VNS. He had no seizures while in the hospital. He is back to his baseline. His Depakote level initially was slightly elevated. Dr. Thad Rangereynolds did not recommend holding her changing the dose of Depakote due to his history of epilepsy and several seizures. He will follow-up with his neurologist as an outpatient.    2. Hyponatremia: Initially I thought hyponatremia could be due to dehydration or related to seizures. I think his hyponatremia is due to Depakote. However we cannot discontinue this due to high risk of status epilepticus. This was discussed with patient's mother.  3. Autism: Patient is at baseline. Aspiration precautions.  4. Thrombocytopenia: This is most likely also due to Depakote. He has had issues in the past with Darvocet opinion from Depakote.. 5. Aspiration pneumonia: Patient had a fever and an x-ray which shows aspiration. He was started on Zosyn and will be  discharged on Augmentin suspension.  DISCHARGE CONDITIONS AND DIET:   She is being discharged home in stable condition on aspiration precautions with. Diet  CONSULTS OBTAINED:  Treatment Team:  Thana FarrLeslie Reynolds, MD  DRUG ALLERGIES:   Allergies  Allergen Reactions  . Lactose Intolerance (Gi)   . Peanut-Containing Drug Products     DISCHARGE MEDICATIONS:   Current Discharge Medication List    START taking these medications   Details  amoxicillin-clavulanate (AUGMENTIN) 400-57 MG/5ML suspension Take 10.9 mLs (875 mg total) by mouth 2 (two) times daily. Qty: 200 mL, Refills: 0      CONTINUE these medications which have NOT CHANGED   Details  albuterol (PROVENTIL) (2.5 MG/3ML) 0.083% nebulizer solution Take 2.5 mg by nebulization every 6 (six) hours as needed for wheezing or shortness of breath.    cetirizine (ZYRTEC) 1 MG/ML syrup Take 15 mg by mouth daily.    cloBAZam (ONFI) 2.5 MG/ML solution Place 15 mg into feeding tube 2 (two) times daily.     clonazePAM (KLONOPIN) 0.5 MG tablet Take 0.5 mg by mouth 3 (three) times daily as needed for anxiety.    fluticasone (FLONASE) 50 MCG/ACT nasal spray Place 2 sprays into both nostrils daily.    guanFACINE (TENEX) 1 MG tablet Place 1 mg into feeding tube at bedtime.     levOCARNitine (CARNITOR) 1 GM/10ML solution Take 500 mg by mouth 2 (two) times daily.    montelukast (SINGULAIR) 10 MG tablet Take 10 mg by mouth at bedtime.    OXcarbazepine (TRILEPTAL) 300 MG/5ML suspension Take 600 mg by mouth  2 (two) times daily.     topiramate (TOPAMAX) 200 MG tablet Place 200 mg into feeding tube 2 (two) times daily.     valproic acid (DEPAKENE) 250 MG/5ML syrup Take 500 mg by mouth 2 (two) times daily.              Today   CHIEF COMPLAINT:  Patient had a fever this morning no seizures since hospitalization.   VITAL SIGNS:  Blood pressure 103/61, pulse 76, temperature 99.2 F (37.3 C), temperature source Oral, resp. rate  18, height 5\' 7"  (1.702 m), weight 83.1 kg (183 lb 1.6 oz), SpO2 99 %.   REVIEW OF SYSTEMS:  Review of Systems  Unable to perform ROS: Patient nonverbal  Constitutional: Positive for fever. Negative for chills and malaise/fatigue.  HENT: Negative.  Negative for ear discharge, ear pain, hearing loss, nosebleeds and sore throat.   Eyes: Negative.  Negative for blurred vision and pain.  Respiratory: Negative.  Negative for cough, hemoptysis, shortness of breath and wheezing.   Cardiovascular: Negative.  Negative for chest pain, palpitations and leg swelling.  Gastrointestinal: Negative.  Negative for abdominal pain, blood in stool, diarrhea, nausea and vomiting.  Genitourinary: Negative.  Negative for dysuria.  Musculoskeletal: Negative.  Negative for back pain.  Skin: Negative.   Neurological: Negative for dizziness, tremors, speech change, focal weakness, seizures and headaches.  Endo/Heme/Allergies: Negative.  Does not bruise/bleed easily.  Psychiatric/Behavioral: Negative.  Negative for depression, hallucinations and suicidal ideas.     PHYSICAL EXAMINATION:  GENERAL:  20 y.o.-year-old patient lying in the bed with no acute distress. He has autism and nonverbal NECK:  Supple, no jugular venous distention. No thyroid enlargement, no tenderness.  LUNGS: Normal breath sounds bilaterally, no wheezing, rales,rhonchi  No use of accessory muscles of respiration.  CARDIOVASCULAR: S1, S2 normal. No murmurs, rubs, or gallops.  ABDOMEN: Soft, non-tender, non-distended. Bowel sounds present. No organomegaly or mass.  EXTREMITIES: No pedal edema, cyanosis, or clubbing.  PSYCHIATRIC: The patient is alert  SKIN: No obvious rash, lesion, or ulcer.  His upper extremity show claw hand but no lower extremity contractures.  DATA REVIEW:   CBC  Recent Labs Lab 09/12/15 2351  WBC 4.9  HGB 12.3*  HCT 34.7*  PLT 90*    Chemistries   Recent Labs Lab 09/12/15 2351 09/13/15 0755  NA 127*  127*  K 4.3 4.1  CL 94* 97*  CO2 23 23  GLUCOSE 80 85  BUN 13 12  CREATININE 0.37* <0.30*  CALCIUM 8.7* 8.6*  AST 16  --   ALT 7*  --   ALKPHOS 45  --   BILITOT 0.4  --     Cardiac Enzymes No results for input(s): TROPONINI in the last 168 hours.  Microbiology Results  @[email or web address] @  RADIOLOGY:  Ct Head Wo Contrast  Result Date: 09/13/2015 CLINICAL DATA:  Acute onset of 6 seizures.  Initial encounter. EXAM: CT HEAD WITHOUT CONTRAST TECHNIQUE: Contiguous axial images were obtained from the base of the skull through the vertex without intravenous contrast. COMPARISON:  CT of the head performed 12/27/2012 FINDINGS: Brain: No evidence of acute infarction, hemorrhage, hydrocephalus, extra-axial collection or mass lesion/mass effect. The posterior fossa, including the cerebellum, brainstem and fourth ventricle, is within normal limits. The third and lateral ventricles, and basal ganglia are unremarkable in appearance. The cerebral hemispheres are symmetric in appearance, with normal gray-white differentiation. No mass effect or midline shift is seen. Vascular: No hyperdense vessel or unexpected calcification. Skull: The calvarium  is grossly unremarkable. There is no evidence of fracture. Sinuses/Orbits: The visualized paranasal sinuses and mastoid air cells are well-aerated. The orbits are not imaged on this study. Other: No significant soft tissue abnormalities are characterized. IMPRESSION: Unremarkable noncontrast CT of the head. Electronically Signed   By: Roanna RaiderJeffery  Chang M.D.   On: 09/13/2015 00:20   Dg Chest Port 1 View  Result Date: 09/14/2015 CLINICAL DATA:  Fever. EXAM: PORTABLE CHEST 1 VIEW COMPARISON:  07/14/2015 FINDINGS: Generator pack in the left chest with lead extending into the cervical region. Shallow inspiration. Heart size and pulmonary vascularity are normal. Bilateral perihilar infiltration may indicate edema or pneumonia. No blunting of costophrenic angles. No pneumothorax.  IMPRESSION: Bilateral perihilar infiltration could indicate edema or pneumonia. Electronically Signed   By: Burman NievesWilliam  Stevens M.D.   On: 09/14/2015 01:17   Dg Chest Portable 1 View  Result Date: 09/13/2015 CLINICAL DATA:  Acute onset of cough and seizures. Initial encounter. EXAM: PORTABLE CHEST 1 VIEW COMPARISON:  Chest radiograph from 03/08/2015 FINDINGS: The lungs are hypoexpanded. Vascular crowding is noted. Right basilar airspace opacity may reflect atelectasis or possibly pneumonia. No pleural effusion or pneumothorax is seen. The cardiomediastinal silhouette is normal in size. No acute osseous abnormalities are identified. A metallic device is noted overlying the left chest wall. IMPRESSION: Lungs hypoexpanded. Right basilar airspace opacity may reflect atelectasis or possibly pneumonia. Electronically Signed   By: Roanna RaiderJeffery  Chang M.D.   On: 09/13/2015 00:21      Management plans discussed with the patient   mother and she isin agreement. Stable for discharge home  Patient should follow up with NEUROLOGY D/w dr Thad Rangerreynolds  CODE STATUS:     Code Status Orders        Start     Ordered   09/13/15 0623  Full code  Continuous     09/13/15 0622    Code Status History    Date Active Date Inactive Code Status Order ID Comments User Context   This patient has a current code status but no historical code status.      TOTAL TIME TAKING CARE OF THIS PATIENT: 36 minutes.      Note: This dictation was prepared with Dragon dictation along with smaller phrase technology. Any transcriptional errors that result from this process are unintentional.  Tyqwan Pink M.D on 09/14/2015 at 11:16 AM  Between 7am to 6pm - Pager - [phone] After 6pm go to [email or web address] - Social research officer, governmentpassword [email or web address] [email or web address]  [facility]  Office  [phone]  CC: Primary care physician; Mila Merryonald Fisher, MD

## 2015-09-14 NOTE — Progress Notes (Signed)
Pharmacy Antibiotic Note  Percell LocusLance A Vandall is a 20 y.o. male admitted on 09/12/2015 with sepsis.  Pharmacy has been consulted for Zosyn and vancomycin dosing.  Plan: 1. Zosyn 3.375 gm IV Q8H EI 2. Vancomycin 1.25 gm IV X 1 followed in 6 hours (stacked dosing) by vancomycin 1.25 gm IV Q8H, predicted trough 15 mcg/mL. Pharmacy will continue to follow and adjust as needed to maintain trough 15 to 20 mcg/mL.   Vd 50.8 L, Ke 0.13 hr-1, T1/2 5.3 hr  Height: 5\' 7"  (170.2 cm) Weight: 181 lb 9.6 oz (82.4 kg) IBW/kg (Calculated) : 66.1  Temp (24hrs), Avg:99.7 F (37.6 C), Min:97.8 F (36.6 C), Max:101.1 F (38.4 C)   Recent Labs Lab 09/12/15 2351 09/13/15 0755  WBC 4.9  --   CREATININE 0.37* <0.30*    CrCl cannot be calculated (This lab value cannot be used to calculate CrCl because it is not a number: <0.30).    Allergies  Allergen Reactions  . Lactose Intolerance (Gi)   . Peanut-Containing Drug Products     Thank you for allowing pharmacy to be a part of this patient's care.  Carola FrostNathan A Valor Quaintance, Pharm.D., BCPS Clinical Pharmacist 09/14/2015 2:19 AM

## 2015-09-14 NOTE — Care Management (Signed)
Discharge to home today per Dr. Juliene PinaMody. No follow-up needs identified. Parents will transport. Gwenette GreetBrenda S Atzin Buchta RN MSN CCM Care Management [phone]

## 2015-09-14 NOTE — Progress Notes (Signed)
Discussed discharge instructions and medications with pt and his parents.  IV removed. No questions at this time.  Pt transported home via car by his parents.  Orson Apeanielle Shatara Stanek, RN

## 2015-09-19 LAB — CULTURE, BLOOD (ROUTINE X 2)
Culture: NO GROWTH
Culture: NO GROWTH

## 2015-09-26 ENCOUNTER — Ambulatory Visit (INDEPENDENT_AMBULATORY_CARE_PROVIDER_SITE_OTHER): Payer: 59 | Admitting: Family Medicine

## 2015-09-26 ENCOUNTER — Encounter: Payer: Self-pay | Admitting: Family Medicine

## 2015-09-26 VITALS — BP 120/70 | HR 78 | Resp 16 | Wt 174.0 lb

## 2015-09-26 DIAGNOSIS — E871 Hypo-osmolality and hyponatremia: Secondary | ICD-10-CM

## 2015-09-26 DIAGNOSIS — J189 Pneumonia, unspecified organism: Secondary | ICD-10-CM | POA: Diagnosis not present

## 2015-09-26 DIAGNOSIS — G40011 Localization-related (focal) (partial) idiopathic epilepsy and epileptic syndromes with seizures of localized onset, intractable, with status epilepticus: Secondary | ICD-10-CM

## 2015-09-26 HISTORY — DX: Pneumonia, unspecified organism: J18.9

## 2015-09-26 NOTE — Progress Notes (Signed)
Patient: Noah Marshall Male    DOB: 1995/09/11   20 y.o.   MRN: [id number] Visit Date: 09/26/2015  Today's Provider: Mila Merry, MD   Chief Complaint  Patient presents with  . Hospitalization Follow-up   Subjective:    HPI   Follow up Hospitalization  Patient was admitted to [facility] on 09/12/2015 and discharged on 09/14/2015. He was treated for Seizures, Hyponatremia, and  pneumonia   Treatment for this included started on zosyn and discharged on Augmentin suspension.  He had normal cortisol, TSH, and electrolytes. He was evaluated by neurology Dr. Thad Ranger in the hospital. It was felt that hyponatremia was secondary to Depakote. Plan was for patient to follow up with regular neurologist at [facility] for medication adjustment.  By his parents report seizures were fairly well controlled until the day of admission when he six seizure. He had not been ill otherwise. Portable CXR the day of admission showed right basilar airspace opacity.    Parents report that he has had no seizures since discharge. Only concern is he has been unstable on his feet, but this was the case even before his seizures. They are concerned he may be taking to much seizure medication. Marland Kitchen ----------------------------------------------------------------    Allergies  Allergen Reactions  . Lactose Intolerance (Gi)   . Peanut-Containing Drug Products    Current Meds  Medication Sig  . albuterol (PROVENTIL) (2.5 MG/3ML) 0.083% nebulizer solution Take 2.5 mg by nebulization every 6 (six) hours as needed for wheezing or shortness of breath.  . cetirizine (ZYRTEC) 1 MG/ML syrup Take 15 mg by mouth daily.  . cloBAZam (ONFI) 2.5 MG/ML solution Place 15 mg into feeding tube 2 (two) times daily.   . clonazePAM (KLONOPIN) 0.5 MG tablet Take 0.5 mg by mouth 3 (three) times daily as needed for anxiety.  . fluticasone (FLONASE) 50 MCG/ACT nasal spray Place 2 sprays into both nostrils daily.  Marland Kitchen guanFACINE (TENEX) 1 MG  tablet Place 1 mg into feeding tube at bedtime.   . levOCARNitine (CARNITOR) 1 GM/10ML solution Take 500 mg by mouth 2 (two) times daily.  . montelukast (SINGULAIR) 10 MG tablet Take 10 mg by mouth at bedtime.  . OXcarbazepine (TRILEPTAL) 300 MG/5ML suspension Take 600 mg by mouth 2 (two) times daily.   Marland Kitchen topiramate (TOPAMAX) 200 MG tablet Place 200 mg into feeding tube 2 (two) times daily.   Marland Kitchen valproic acid (DEPAKENE) 250 MG/5ML syrup Take 500 mg by mouth 2 (two) times daily.    Review of Systems  Constitutional: Negative for appetite change, chills and fever.  Respiratory: Negative for chest tightness, shortness of breath and wheezing.   Cardiovascular: Negative for chest pain and palpitations.  Gastrointestinal: Negative for abdominal pain, nausea and vomiting.  Neurological: Positive for weakness.    Social History  Substance Use Topics  . Smoking status: Never Smoker  . Smokeless tobacco: Never Used  . Alcohol use Not on file   Objective:   BP 120/70 (BP Location: Left Arm, Patient Position: Sitting, Cuff Size: Normal)   Pulse 78   Resp 16   Wt 174 lb (78.9 kg)   SpO2 98%   BMI 27.25 kg/m   Physical Exam   General Appearance:    Alert, cooperative, no distress  Eyes:    PERRL, conjunctiva/corneas clear, EOM's intact       Lungs:     Clear to auscultation bilaterally, respirations unlabored  Marshall:    Regular rate and rhythm  Neurologic:   Awake, alert, oriented. At his normal baseline mental status.           Assessment & Plan:     1. Pneumonia, unspecified laterality, unspecified part of lung Resolved. Unclear if this was due to aspiration from seizures or if this was trigger for worsening seizures.   2. Hyponatremia Normal TSH and cortisol. Suspect this is side effect of medications. Encourage to get in with [facility] ASAP to discuss medication adjustment. Consider nephrology evaluation if this persists despite medication adjustment.   3. Partial  idiopathic epilepsy with seizures of localized onset, intractable, with status epilepticus (HCC) Controlled since discharge as above.        Mila Merryonald Fisher, MD  [facility]

## 2015-12-19 DIAGNOSIS — G930 Cerebral cysts: Secondary | ICD-10-CM | POA: Diagnosis not present

## 2015-12-19 DIAGNOSIS — G40311 Generalized idiopathic epilepsy and epileptic syndromes, intractable, with status epilepticus: Secondary | ICD-10-CM | POA: Diagnosis not present

## 2015-12-19 DIAGNOSIS — Z23 Encounter for immunization: Secondary | ICD-10-CM | POA: Diagnosis not present

## 2015-12-19 DIAGNOSIS — G40909 Epilepsy, unspecified, not intractable, without status epilepticus: Secondary | ICD-10-CM | POA: Diagnosis not present

## 2015-12-19 DIAGNOSIS — F840 Autistic disorder: Secondary | ICD-10-CM | POA: Diagnosis not present

## 2016-06-06 DIAGNOSIS — J301 Allergic rhinitis due to pollen: Secondary | ICD-10-CM | POA: Diagnosis not present

## 2016-06-06 DIAGNOSIS — J4530 Mild persistent asthma, uncomplicated: Secondary | ICD-10-CM | POA: Diagnosis not present

## 2016-06-06 DIAGNOSIS — J3089 Other allergic rhinitis: Secondary | ICD-10-CM | POA: Diagnosis not present

## 2016-06-06 DIAGNOSIS — Z91010 Allergy to peanuts: Secondary | ICD-10-CM | POA: Diagnosis not present

## 2016-07-30 DIAGNOSIS — G40311 Generalized idiopathic epilepsy and epileptic syndromes, intractable, with status epilepticus: Secondary | ICD-10-CM | POA: Diagnosis not present

## 2016-07-30 DIAGNOSIS — G40909 Epilepsy, unspecified, not intractable, without status epilepticus: Secondary | ICD-10-CM | POA: Diagnosis not present

## 2016-07-30 DIAGNOSIS — R569 Unspecified convulsions: Secondary | ICD-10-CM | POA: Diagnosis not present

## 2017-02-10 ENCOUNTER — Emergency Department: Payer: 59

## 2017-02-10 ENCOUNTER — Encounter: Payer: Self-pay | Admitting: Emergency Medicine

## 2017-02-10 ENCOUNTER — Inpatient Hospital Stay
Admission: EM | Admit: 2017-02-10 | Discharge: 2017-02-14 | DRG: 871 | Disposition: A | Discharge status: Home / Self Care | Payer: 59 | Attending: Internal Medicine | Admitting: Internal Medicine

## 2017-02-10 ENCOUNTER — Other Ambulatory Visit: Payer: Self-pay

## 2017-02-10 DIAGNOSIS — D638 Anemia in other chronic diseases classified elsewhere: Secondary | ICD-10-CM | POA: Diagnosis present

## 2017-02-10 DIAGNOSIS — F840 Autistic disorder: Secondary | ICD-10-CM | POA: Diagnosis present

## 2017-02-10 DIAGNOSIS — R569 Unspecified convulsions: Secondary | ICD-10-CM | POA: Diagnosis not present

## 2017-02-10 DIAGNOSIS — A499 Bacterial infection, unspecified: Secondary | ICD-10-CM | POA: Diagnosis not present

## 2017-02-10 DIAGNOSIS — A419 Sepsis, unspecified organism: Secondary | ICD-10-CM

## 2017-02-10 DIAGNOSIS — J189 Pneumonia, unspecified organism: Secondary | ICD-10-CM | POA: Diagnosis not present

## 2017-02-10 DIAGNOSIS — E739 Lactose intolerance, unspecified: Secondary | ICD-10-CM | POA: Diagnosis present

## 2017-02-10 DIAGNOSIS — Z888 Allergy status to other drugs, medicaments and biological substances status: Secondary | ICD-10-CM | POA: Diagnosis not present

## 2017-02-10 DIAGNOSIS — N390 Urinary tract infection, site not specified: Secondary | ICD-10-CM | POA: Diagnosis present

## 2017-02-10 DIAGNOSIS — R7881 Bacteremia: Secondary | ICD-10-CM | POA: Diagnosis not present

## 2017-02-10 DIAGNOSIS — J181 Lobar pneumonia, unspecified organism: Secondary | ICD-10-CM

## 2017-02-10 DIAGNOSIS — E871 Hypo-osmolality and hyponatremia: Secondary | ICD-10-CM | POA: Diagnosis not present

## 2017-02-10 DIAGNOSIS — Z8249 Family history of ischemic heart disease and other diseases of the circulatory system: Secondary | ICD-10-CM | POA: Diagnosis not present

## 2017-02-10 DIAGNOSIS — D696 Thrombocytopenia, unspecified: Secondary | ICD-10-CM | POA: Diagnosis present

## 2017-02-10 DIAGNOSIS — G40901 Epilepsy, unspecified, not intractable, with status epilepticus: Secondary | ICD-10-CM | POA: Diagnosis present

## 2017-02-10 DIAGNOSIS — Z23 Encounter for immunization: Secondary | ICD-10-CM

## 2017-02-10 DIAGNOSIS — A4189 Other specified sepsis: Secondary | ICD-10-CM | POA: Diagnosis present

## 2017-02-10 DIAGNOSIS — J690 Pneumonitis due to inhalation of food and vomit: Secondary | ICD-10-CM | POA: Diagnosis not present

## 2017-02-10 DIAGNOSIS — B9689 Other specified bacterial agents as the cause of diseases classified elsewhere: Secondary | ICD-10-CM | POA: Diagnosis present

## 2017-02-10 DIAGNOSIS — Z833 Family history of diabetes mellitus: Secondary | ICD-10-CM | POA: Diagnosis not present

## 2017-02-10 DIAGNOSIS — R509 Fever, unspecified: Secondary | ICD-10-CM | POA: Diagnosis not present

## 2017-02-10 DIAGNOSIS — R6520 Severe sepsis without septic shock: Secondary | ICD-10-CM | POA: Diagnosis present

## 2017-02-10 LAB — CBC WITH DIFFERENTIAL/PLATELET
Basophils Absolute: 0 10*3/uL (ref 0–0.1)
Basophils Relative: 0 %
Eosinophils Absolute: 0 10*3/uL (ref 0–0.7)
Eosinophils Relative: 0 %
HCT: 31.3 % — ABNORMAL LOW (ref 40.0–52.0)
Hemoglobin: 10.9 g/dL — ABNORMAL LOW (ref 13.0–18.0)
Lymphocytes Relative: 3 %
Lymphs Abs: 0.3 10*3/uL — ABNORMAL LOW (ref 1.0–3.6)
MCH: 30.6 pg (ref 26.0–34.0)
MCHC: 34.7 g/dL (ref 32.0–36.0)
MCV: 88.1 fL (ref 80.0–100.0)
Monocytes Absolute: 1.9 10*3/uL — ABNORMAL HIGH (ref 0.2–1.0)
Monocytes Relative: 17 %
Neutro Abs: 8.7 10*3/uL — ABNORMAL HIGH (ref 1.4–6.5)
Neutrophils Relative %: 80 %
Platelets: 97 10*3/uL — ABNORMAL LOW (ref 150–440)
RBC: 3.56 MIL/uL — ABNORMAL LOW (ref 4.40–5.90)
RDW: 14.1 % (ref 11.5–14.5)
WBC: 10.9 10*3/uL — ABNORMAL HIGH (ref 3.8–10.6)

## 2017-02-10 LAB — INFLUENZA PANEL BY PCR (TYPE A & B)
Influenza A By PCR: NEGATIVE
Influenza B By PCR: NEGATIVE

## 2017-02-10 LAB — URINALYSIS, ROUTINE W REFLEX MICROSCOPIC
Bilirubin Urine: NEGATIVE
Glucose, UA: NEGATIVE mg/dL
Ketones, ur: 5 mg/dL — AB
Nitrite: NEGATIVE
Protein, ur: 100 mg/dL — AB
Specific Gravity, Urine: 1.02 (ref 1.005–1.030)
pH: 7 (ref 5.0–8.0)

## 2017-02-10 LAB — PROCALCITONIN: Procalcitonin: 1.99 ng/mL

## 2017-02-10 LAB — COMPREHENSIVE METABOLIC PANEL
ALT: 7 U/L — ABNORMAL LOW (ref 17–63)
AST: 17 U/L (ref 15–41)
Albumin: 3.5 g/dL (ref 3.5–5.0)
Alkaline Phosphatase: 53 U/L (ref 38–126)
Anion gap: 12 (ref 5–15)
BUN: 22 mg/dL — ABNORMAL HIGH (ref 6–20)
CO2: 22 mmol/L (ref 22–32)
Calcium: 9.1 mg/dL (ref 8.9–10.3)
Chloride: 92 mmol/L — ABNORMAL LOW (ref 101–111)
Creatinine, Ser: 0.8 mg/dL (ref 0.61–1.24)
GFR calc Af Amer: >60 mL/min (ref >60)
GFR calc non Af Amer: >60 mL/min (ref >60)
Glucose, Bld: 98 mg/dL (ref 65–99)
Potassium: 4 mmol/L (ref 3.5–5.1)
Sodium: 126 mmol/L — ABNORMAL LOW (ref 135–145)
Total Bilirubin: 0.5 mg/dL (ref 0.3–1.2)
Total Protein: 6.9 g/dL (ref 6.5–8.1)

## 2017-02-10 LAB — LACTIC ACID, PLASMA
Lactic Acid, Venous: 1.2 mmol/L (ref 0.5–1.9)
Lactic Acid, Venous: 2.2 mmol/L (ref 0.5–1.9)

## 2017-02-10 LAB — PROTIME-INR
INR: 1.03
Prothrombin Time: 13.4 seconds (ref 11.4–15.2)

## 2017-02-10 LAB — VALPROIC ACID LEVEL: Valproic Acid Lvl: 79 ug/mL (ref 50.0–100.0)

## 2017-02-10 LAB — APTT: aPTT: <24 seconds — ABNORMAL LOW (ref 24–36)

## 2017-02-10 MED ORDER — OXCARBAZEPINE 150 MG PO TABS
600.0000 mg | ORAL_TABLET | Freq: Two times a day (BID) | ORAL | Status: DC
  Administered 2017-02-11 – 2017-02-14 (×7): 600 mg via ORAL
  Filled 2017-02-10 (×8): qty 4

## 2017-02-10 MED ORDER — SODIUM CHLORIDE 0.9 % IV BOLUS (SEPSIS)
1000.0000 mL | Freq: Once | INTRAVENOUS | Status: AC
  Administered 2017-02-10: 1000 mL via INTRAVENOUS

## 2017-02-10 MED ORDER — ALBUTEROL SULFATE (2.5 MG/3ML) 0.083% IN NEBU: 2.5000 mg | INHALATION_SOLUTION | RESPIRATORY_TRACT | Status: DC | PRN

## 2017-02-10 MED ORDER — SENNOSIDES-DOCUSATE SODIUM 8.6-50 MG PO TABS: 1.0000 | ORAL_TABLET | Freq: Every evening | ORAL | Status: DC | PRN

## 2017-02-10 MED ORDER — BISACODYL 5 MG PO TBEC: 5.0000 mg | DELAYED_RELEASE_TABLET | Freq: Every day | ORAL | Status: DC | PRN

## 2017-02-10 MED ORDER — VANCOMYCIN HCL IN DEXTROSE 1-5 GM/200ML-% IV SOLN
1000.0000 mg | Freq: Three times a day (TID) | INTRAVENOUS | Status: DC
  Administered 2017-02-10 – 2017-02-11 (×2): 1000 mg via INTRAVENOUS
  Filled 2017-02-10 (×3): qty 200

## 2017-02-10 MED ORDER — ACETAMINOPHEN 650 MG RE SUPP
650.0000 mg | Freq: Four times a day (QID) | RECTAL | Status: DC | PRN
  Administered 2017-02-10: 650 mg via RECTAL
  Filled 2017-02-10 (×2): qty 1

## 2017-02-10 MED ORDER — VANCOMYCIN HCL IN DEXTROSE 1-5 GM/200ML-% IV SOLN
1000.0000 mg | Freq: Once | INTRAVENOUS | Status: AC
  Administered 2017-02-10: 1000 mg via INTRAVENOUS
  Filled 2017-02-10: qty 200

## 2017-02-10 MED ORDER — GUAIFENESIN 100 MG/5ML PO SOLN
5.0000 mL | ORAL | Status: DC | PRN
  Filled 2017-02-10: qty 5

## 2017-02-10 MED ORDER — SODIUM CHLORIDE 0.9 % IV SOLN
INTRAVENOUS | Status: DC
  Administered 2017-02-10 – 2017-02-13 (×5): via INTRAVENOUS

## 2017-02-10 MED ORDER — ENOXAPARIN SODIUM 40 MG/0.4ML ~~LOC~~ SOLN
40.0000 mg | SUBCUTANEOUS | Status: DC
  Administered 2017-02-10 – 2017-02-12 (×3): 40 mg via SUBCUTANEOUS
  Filled 2017-02-10 (×3): qty 0.4

## 2017-02-10 MED ORDER — PIPERACILLIN-TAZOBACTAM 3.375 G IVPB 30 MIN
3.3750 g | Freq: Once | INTRAVENOUS | Status: AC
  Administered 2017-02-10: 3.375 g via INTRAVENOUS
  Filled 2017-02-10: qty 50

## 2017-02-10 MED ORDER — ONDANSETRON HCL 4 MG/2ML IJ SOLN: 4.0000 mg | Freq: Four times a day (QID) | INTRAMUSCULAR | Status: DC | PRN

## 2017-02-10 MED ORDER — VALPROATE SODIUM 250 MG/5ML PO SOLN
500.0000 mg | Freq: Two times a day (BID) | ORAL | Status: DC
  Administered 2017-02-11 – 2017-02-14 (×7): 500 mg via ORAL
  Filled 2017-02-10 (×8): qty 10

## 2017-02-10 MED ORDER — INFLUENZA VAC SPLIT QUAD 0.5 ML IM SUSY
0.5000 mL | PREFILLED_SYRINGE | INTRAMUSCULAR | Status: AC
  Administered 2017-02-12: 0.5 mL via INTRAMUSCULAR
  Filled 2017-02-10: qty 0.5

## 2017-02-10 MED ORDER — VALPROATE SODIUM 500 MG/5ML IV SOLN: 500.0000 mg | Freq: Two times a day (BID) | INTRAVENOUS | Status: DC

## 2017-02-10 MED ORDER — FLUTICASONE PROPIONATE 50 MCG/ACT NA SUSP
2.0000 | Freq: Every day | NASAL | Status: DC
  Administered 2017-02-10 – 2017-02-14 (×5): 2 via NASAL
  Filled 2017-02-10: qty 16

## 2017-02-10 MED ORDER — LEVOCARNITINE 1 GM/10ML PO SOLN
500.0000 mg | Freq: Two times a day (BID) | ORAL | Status: DC
  Administered 2017-02-11 – 2017-02-14 (×7): 500 mg via ORAL
  Filled 2017-02-10 (×8): qty 5

## 2017-02-10 MED ORDER — ACETAMINOPHEN 325 MG PO TABS
650.0000 mg | ORAL_TABLET | Freq: Four times a day (QID) | ORAL | Status: DC | PRN
  Administered 2017-02-11: 650 mg via ORAL
  Filled 2017-02-10: qty 2

## 2017-02-10 MED ORDER — VALPROATE SODIUM 500 MG/5ML IV SOLN
500.0000 mg | Freq: Once | INTRAVENOUS | Status: AC
  Administered 2017-02-11: 500 mg via INTRAVENOUS
  Filled 2017-02-10: qty 5

## 2017-02-10 MED ORDER — HYDROCODONE-ACETAMINOPHEN 5-325 MG PO TABS: 1.0000 | ORAL_TABLET | ORAL | Status: DC | PRN

## 2017-02-10 MED ORDER — MONTELUKAST SODIUM 10 MG PO TABS
10.0000 mg | ORAL_TABLET | Freq: Every day | ORAL | Status: DC
  Administered 2017-02-11 – 2017-02-13 (×3): 10 mg via ORAL
  Filled 2017-02-10 (×4): qty 1

## 2017-02-10 MED ORDER — SODIUM CHLORIDE 0.9 % IV SOLN
1000.0000 mg | Freq: Once | INTRAVENOUS | Status: AC
  Administered 2017-02-10: 1000 mg via INTRAVENOUS
  Filled 2017-02-10: qty 10

## 2017-02-10 MED ORDER — LORAZEPAM 2 MG/ML IJ SOLN: 2.0000 mg | INTRAMUSCULAR | Status: DC | PRN

## 2017-02-10 MED ORDER — CLONAZEPAM 0.5 MG PO TABS: 0.5000 mg | ORAL_TABLET | Freq: Three times a day (TID) | ORAL | Status: DC | PRN

## 2017-02-10 MED ORDER — ONDANSETRON HCL 4 MG PO TABS: 4.0000 mg | ORAL_TABLET | Freq: Four times a day (QID) | ORAL | Status: DC | PRN

## 2017-02-10 MED ORDER — PIPERACILLIN-TAZOBACTAM 3.375 G IVPB
3.3750 g | Freq: Three times a day (TID) | INTRAVENOUS | Status: DC
  Administered 2017-02-10 – 2017-02-14 (×11): 3.375 g via INTRAVENOUS
  Filled 2017-02-10 (×11): qty 50

## 2017-02-10 NOTE — ED Notes (Signed)
CODE  SEPSIS  CALLED  TO  CAREL[facility] 

## 2017-02-10 NOTE — ED Notes (Signed)
Pt tolerating turning for rectal temp well this morning.  Mother at bedside.  Informed mother that pt's room should be ready to call report in approx 15 minutes.

## 2017-02-10 NOTE — ED Triage Notes (Addendum)
Patient from home via [facility]. Per EMS, patient's mother called out for seizures. Patient has history and is autistic and nonverbal at baseline. Per EMS, mother reports patient is "taking longer than normal to come around". Mother also told EMS patient was at baseline. EMS reports fever of 102.1. Mother also states patient had 3 seizures 2 days ago but returned to baseline quickly after each one.

## 2017-02-10 NOTE — H&P (Signed)
[facility] [place] [facility]   PATIENT NAME: Noah Marshall    MR#:  [id number]  DATE OF BIRTH:  Sep 12, 1995  DATE OF ADMISSION:  02/10/2017  PRIMARY CARE PHYSICIAN: Malva Limes, MD   REQUESTING/REFERRING PHYSICIAN: Myrna Blazer, MD  CHIEF COMPLAINT:   Chief Complaint  Patient presents with  . Seizures   Seizure and altered mental status change. HISTORY OF PRESENT ILLNESS:  Noah Marshall  is a 22 y.o. male with a known history of seizure disorder, aspiration pneumonia, autism and respiratory failure.  The patient is nonverbal and noncommunicative.  According to her mother, the patient had a severe episode of seizure several days ago and become more responsive than usual.  He feels very warm this morning and had another episode of seizure.  She says she has been postictal since then.  In the ED, he is found fever 102.7, tachycardia at 120s, tachypnea and chest x-ray show right base aspiration pneumonia.  PAST MEDICAL HISTORY:   Past Medical History:  Diagnosis Date  . Aspiration pneumonia (HCC)   . Autism   . Epilepsy (HCC)   . Epilepsy with status epilepticus (HCC)   . Intracranial arachnoid cysts   . Pes planovalgus   . Premature ventricular contractions   . Respiratory failure (HCC)     PAST SURGICAL HISTORY:   Past Surgical History:  Procedure Laterality Date  . FOREIGN BODY REMOVAL N/A 03/08/2015   Procedure: FOREIGN BODY REMOVAL;  Surgeon: Elnita Maxwell, MD;  Location: [facility] ENDOSCOPY;  Service: Endoscopy;  Laterality: N/A;  . IMPLANTATION VAGAL NERVE STIMULATOR  2011  . REPLACE / REVISE VAGAL NERVE STIMULATOR      SOCIAL HISTORY:   Social History   Tobacco Use  . Smoking status: Never Smoker  . Smokeless tobacco: Never Used  Substance Use Topics  . Alcohol use: No    Frequency: Never    FAMILY HISTORY:   Family History  Problem Relation Age of Onset  . Diabetes Mellitus II Father   . Hypertension Father      DRUG ALLERGIES:   Allergies  Allergen Reactions  . Lactose Intolerance (Gi)   . Peanut-Containing Drug Products     REVIEW OF SYSTEMS:   Review of Systems  Unable to perform ROS: Patient nonverbal    MEDICATIONS AT HOME:   Prior to Admission medications   Medication Sig Start Date End Date Taking? Authorizing Provider  cloBAZam (ONFI) 2.5 MG/ML solution Place 15 mg into feeding tube 2 (two) times daily.    Yes [provider]  clonazePAM (KLONOPIN) 0.5 MG tablet Take 0.5 mg by mouth 3 (three) times daily as needed for anxiety.   Yes [provider]  fluticasone (FLONASE) 50 MCG/ACT nasal spray Place 2 sprays into both nostrils daily.   Yes [provider]  guanFACINE (TENEX) 1 MG tablet Place 1 mg into feeding tube at bedtime.    Yes [provider]  levOCARNitine (CARNITOR) 1 GM/10ML solution Take 500 mg by mouth 2 (two) times daily.   Yes [provider]  montelukast (SINGULAIR) 10 MG tablet Take 10 mg by mouth at bedtime.   Yes [provider]  OXcarbazepine (TRILEPTAL) 300 MG/5ML suspension Take 600 mg by mouth 2 (two) times daily.    Yes [provider]  topiramate (TOPAMAX) 200 MG tablet Place 200 mg into feeding tube 2 (two) times daily.    Yes [provider]  valproic acid (DEPAKENE) 250 MG/5ML  syrup Take 500 mg by mouth 2 (two) times daily.   Yes [provider]      VITAL SIGNS:  Blood pressure 119/75, pulse (!) 115, temperature 100.3 F (37.9 C), temperature source Rectal, resp. rate (!) 23, height 5\' 6"  (1.676 m), weight 145 lb (65.8 kg), SpO2 100 %.  PHYSICAL EXAMINATION:  Physical Exam  GENERAL:  22 y.o.-year-old patient lying in the bed, responsive to verbal stimuli. EYES: Pupils equal, round, reactive to light. No scleral icterus. Extraocular muscles intact.  HEENT: Head atraumatic, normocephalic. NECK:  Supple, no jugular venous distention. No thyroid enlargement, no  tenderness.  LUNGS: Normal breath sounds bilaterally, no wheezing, rales,rhonchi or crepitation. No use of accessory muscles of respiration.  CARDIOVASCULAR: S1, S2 normal. No murmurs, rubs, or gallops.  ABDOMEN: Soft, nontender, nondistended. Bowel sounds present. No organomegaly or mass.  EXTREMITIES: No pedal edema, cyanosis, or clubbing.  NEUROLOGIC: Unable to exam.  PSYCHIATRIC: The patient is responsive to stimuli. SKIN: No obvious rash, lesion, or ulcer.   LABORATORY PANEL:   CBC Recent Labs  Lab 02/10/17 1351  WBC 10.9*  HGB 10.9*  HCT 31.3*  PLT 97*   ------------------------------------------------------------------------------------------------------------------  Chemistries  Recent Labs  Lab 02/10/17 1351  NA 126*  K 4.0  CL 92*  CO2 22  GLUCOSE 98  BUN 22*  CREATININE 0.80  CALCIUM 9.1  AST 17  ALT 7*  ALKPHOS 53  BILITOT 0.5   ------------------------------------------------------------------------------------------------------------------  Cardiac Enzymes No results for input(s): TROPONINI in the last 168 hours. ------------------------------------------------------------------------------------------------------------------  RADIOLOGY:  Ct Head Wo Contrast  Result Date: 02/10/2017 CLINICAL DATA:  22 year old with seizure.  Initial encounter. EXAM: CT HEAD WITHOUT CONTRAST TECHNIQUE: Contiguous axial images were obtained from the base of the skull through the vertex without intravenous contrast. COMPARISON:  09/13/2015, 12/27/2012 and 12/24/2011. FINDINGS: Brain: No intracranial hemorrhage or CT evidence of large acute infarct. 3 x 1.6 x 1.7 cm cystic structure medial to the right hippocampus (deformity hippocampus) unchanged from prior exams. Global atrophy. Falx calcifications Vascular: New negative Skull: Negative Sinuses/Orbits: Visualized orbital structures acute abnormality noted. Minimal mucosal thickening ethmoid sinus air cells and maxillary  sinuses. Other: Mastoid air cells and middle ear cavities are clear. IMPRESSION: No intracranial hemorrhage or CT evidence of large acute infarct. Stable 3 x 1.6 x 1.7 cm cystic structure medial to the right hippocampus (deformity hippocampus). If further delineation is clinically desired, MR can be obtained. Global atrophy. Electronically Signed   By: Lacy DuverneySteven  Olson M.D.   On: 02/10/2017 14:25   Dg Chest Port 1 View  Result Date: 02/10/2017 CLINICAL DATA:  Seizures. EXAM: PORTABLE CHEST 1 VIEW COMPARISON:  Single-view of the chest 09/14/2015. FINDINGS: Lung volumes are low. A small focus of airspace opacity is seen in the right base. The left lung is clear. No pneumothorax or pleural effusion. Heart size is normal. Stimulator device with lead extending into the neck is unchanged. IMPRESSION: Small focus of hazy airspace in the right base in this low volume chest is likely atelectasis but could be due to aspiration or pneumonia. The exam is otherwise negative. Electronically Signed   By: Drusilla Kannerhomas  Dalessio M.D.   On: 02/10/2017 14:30      IMPRESSION AND PLAN:   Sepsis due to aspiration pneumonia. The patient will be admitted to medical floor. Start Zosyn IV pharmacy to dose, follow-up CBC in the cultures.  Robitussin as needed.  Seizure activity.  Seizure and fall precaution, continue home seizure medication and follow-up neurologist  consult.  Chronic hyponatremia.  IV fluid support and follow-up BMP. Chronic thrombocytopenia.  Stable.  All the records are reviewed and case discussed with ED provider. Management plans discussed with the patient's mother nd they are in agreement.  CODE STATUS: Full code  TOTAL TIME TAKING CARE OF THIS PATIENT: 55 minutes.    Shaune Pollack M.D on 02/10/2017 at 3:27 PM  Between 7am to 6pm - Pager - [phone]  After 6pm go to [email or web address] - Social research officer, government  [facility] [place] [facility]  Office  [phone]  CC: Primary care physician;  Malva Limes, MD   Note: This dictation was prepared with Dragon dictation along with smaller phrase technology. Any transcriptional errors that result from this process are unin

## 2017-02-10 NOTE — Progress Notes (Signed)
CODE SEPSIS - PHARMACY COMMUNICATION  **Broad Spectrum Antibiotics should be administered within 1 hour of Sepsis diagnosis**  Time Code Sepsis Called/Page Received: 1353  Antibiotics Ordered: vancomycin/Zosyn  Time of 1st antibiotic administration: 1432  Additional action taken by pharmacy:   If necessary, Name of Provider/Nurse Contacted:     Noah Marshall, Noah Marshall ,PharmD Clinical Pharmacist  02/10/2017  1:53 PM

## 2017-02-10 NOTE — ED Provider Notes (Signed)
[facility] Emergency Department Provider Note  ____________________________________________   First MD Initiated Contact with Patient 02/10/17 1349     (approximate)  I have reviewed the triage vital signs and the nursing notes.   HISTORY  Chief Complaint Seizures   HPI Noah Marshall is a 22 y.o. male with a history of autism, epilepsy as well as aspiration pneumonia who is presenting to the emergency department with a seizure and fever today.  He is accompanied by his mother who reports that he had 3 seizures several days ago but slept it off."  However, she says that he felt very warm this morning and then had a seizure that was unwitnessed.  She says that he has been postictal since.  She says that he is usually nonverbal but normally is smiling and does some grunting.  However, he has been silent and is not as interactive as he is normally.  Mother says that his urine has been smelling very strong.  Does not report a cough or any vomiting.  Says that he has had multiple seizures per week ever since being switched to generic seizure medications about 1 month ago.    Past Medical History:  Diagnosis Date  . Aspiration pneumonia (HCC)   . Autism   . Epilepsy (HCC)   . Epilepsy with status epilepticus (HCC)   . Intracranial arachnoid cysts   . Pes planovalgus   . Premature ventricular contractions   . Respiratory failure [facility](HCC)     Patient Active Problem List   Diagnosis Date Noted  . Pneumonia 09/26/2015  . Hyponatremia 09/26/2015  . Epilepsy (HCC) 09/13/2015    Past Surgical History:  Procedure Laterality Date  . FOREIGN BODY REMOVAL N/A 03/08/2015   Procedure: FOREIGN BODY REMOVAL;  Surgeon: Elnita MaxwellMatthew Gordon Rein, MD;  [facility] ENDOSCOPY;  Service: Endoscopy;  Laterality: N/A;  . IMPLANTATION VAGAL NERVE STIMULATOR  2011  . REPLACE / REVISE VAGAL NERVE STIMULATOR      Prior to Admission medications   Medication Sig Start Date End Date  Taking? Authorizing Provider  albuterol (PROVENTIL) (2.5 MG/3ML) 0.083% nebulizer solution Take 2.5 mg by nebulization every 6 (six) hours as needed for wheezing or shortness of breath.    [provider]  cetirizine (ZYRTEC) 1 MG/ML syrup Take 15 mg by mouth daily.    [provider]  cloBAZam (ONFI) 2.5 MG/ML solution Place 15 mg into feeding tube 2 (two) times daily.     [provider]  clonazePAM (KLONOPIN) 0.5 MG tablet Take 0.5 mg by mouth 3 (three) times daily as needed for anxiety.    [provider]  fluticasone (FLONASE) 50 MCG/ACT nasal spray Place 2 sprays into both nostrils daily.    [provider]  guanFACINE (TENEX) 1 MG tablet Place 1 mg into feeding tube at bedtime.     [provider]  levOCARNitine (CARNITOR) 1 GM/10ML solution Take 500 mg by mouth 2 (two) times daily.    [provider]  montelukast (SINGULAIR) 10 MG tablet Take 10 mg by mouth at bedtime.    [provider]  OXcarbazepine (TRILEPTAL) 300 MG/5ML suspension Take 600 mg by mouth 2 (two) times daily.     [provider]  topiramate (TOPAMAX) 200 MG tablet Place 200 mg into feeding tube 2 (two) times daily.     [provider]  valproic acid (DEPAKENE) 250 MG/5ML syrup Take 500 mg by mouth 2 (two) times daily.  [provider]    Allergies Lactose intolerance (gi) and Peanut-containing drug products  Family History  Problem Relation Age of Onset  . Diabetes Mellitus II Father   . Hypertension Father     Social History Social History   Tobacco Use  . Smoking status: Never Smoker  . Smokeless tobacco: Never Used  Substance Use Topics  . Alcohol use: No    Frequency: Never  . Drug use: No    Review of Systems  Level 5 caveat secondary to patient's postictal state as well as baseline mental status being nonverbal.   ____________________________________________   PHYSICAL EXAM:  VITAL  SIGNS: ED Triage Vitals  Enc Vitals Group     BP 02/10/17 1342 112/75     Pulse Rate 02/10/17 1342 (!) 121     Resp 02/10/17 1342 16     Temp 02/10/17 1342 (!) 102.7 F (39.3 C)     Temp Source 02/10/17 1342 Rectal     SpO2 02/10/17 1342 97 %     Weight 02/10/17 1348 145 lb (65.8 kg)     Height 02/10/17 1348 5\' 6"  (1.676 m)     Head Circumference --      Peak Flow --      Pain Score --      Pain Loc --      Pain Edu? --      Excl. in GC? --     Constitutional: Alert alert but nonverbal.  In no acute distress.  Strong smell of foul-smelling urine in the room.  Patient appears to have urinated on himself.  The mother reports this is from his seizure and that he is changed regularly. Eyes: Conjunctivae are normal.  Head: Atraumatic. Nose: No congestion/rhinnorhea. Mouth/Throat: Mucous membranes are moist.  No oral ulcerations noted. Neck: No stridor.  I am able to range his neck easily and there does not appear to be any neck stiffness.  He does not grimace with range of motion of the neck. Cardiovascular: Tachycardic, regular rhythm. Grossly normal heart sounds.   Respiratory: Normal respiratory effort.  No retractions. Lungs CTAB. Gastrointestinal: Soft and nontender. No distention.  Musculoskeletal: Right lower extremity appears healthy with good musculature.  Atrophy of the left lower extremity.  The mother says this is his baseline.  Contractures of the bilateral hands.  Over the left side of the chest the vagal nerve stimulator is noted.  There is not appear to be any tenderness as the patient does not grimace when I palpate the stimulator.  There is no overlying erythema, induration or pus.  Pocket is intact without any dehiscence. Neurologic:  No gross focal neurologic deficits are appreciated. Skin:  Skin is warm, dry and intact. No rash noted.  No sacral decubitus ulceration noted.     ____________________________________________   LABS (all labs ordered are listed, but  only abnormal results are displayed)  Labs Reviewed  COMPREHENSIVE METABOLIC PANEL - Abnormal; Notable for the following components:      Result Value   Sodium 126 (*)    Chloride 92 (*)    BUN 22 (*)    ALT 7 (*)    All other components within normal limits  CBC WITH DIFFERENTIAL/PLATELET - Abnormal; Notable for the following components:   WBC 10.9 (*)    RBC 3.56 (*)    Hemoglobin 10.9 (*)    HCT 31.3 (*)    Platelets 97 (*)    Neutro Abs 8.7 (*)    Lymphs  Abs 0.3 (*)    Monocytes Absolute 1.9 (*)    All other components within normal limits  CULTURE, BLOOD (ROUTINE X 2)  CULTURE, BLOOD (ROUTINE X 2)  URINE CULTURE  LACTIC ACID, PLASMA  VALPROIC ACID LEVEL  URINALYSIS, ROUTINE W REFLEX MICROSCOPIC  LACTIC ACID, PLASMA  INFLUENZA PANEL BY PCR (TYPE A & B)   ____________________________________________  EKG  ED ECG REPORT I, Arelia Longest, the attending physician, personally viewed and interpreted this ECG.   Date: 02/10/2017  EKG Time: 1417  Rate: 120  Rhythm: sinus tachycardia with PVC x1.  Axis: Normal  Intervals:none  ST&T Change: No ST segment elevation or depression.  No abnormal T wave inversion.  ____________________________________________  RADIOLOGY  Small area of hazy airspace disease to the right lower field consistent with pneumonia, possibly aspiration.  No acute finding on the CT of the brain.  Unchanged cystic structure. ____________________________________________   PROCEDURES  Procedure(s) performed:  Angiocath insertion Performed by: Arelia Longest  Consent: Verbal consent obtained from mother Risks and benefits: risks, benefits and alternatives were discussed Time out: Immediately prior to procedure a "time out" was called to verify the correct patient, procedure, equipment, support staff and site/side marked as required.  Preparation: Patient was prepped in the usual sterile fashion.  Vein Location: left ej  Gauge:  18  Normal blood return and flush without difficulty Patient tolerance: Patient tolerated the procedure well with no immediate complications.     Procedures  Critical Care performed:   ____________________________________________   INITIAL IMPRESSION / ASSESSMENT AND PLAN / ED COURSE  Pertinent labs & imaging results that were available during my care of the patient were reviewed by me and considered in my medical decision making (see chart for details).  DDx: Sepsis, UTI, infected vagal nerve stimulator, seizure, aspiration pneumonia, bacteremia, encephalitis As part of my medical decision making, I reviewed the following data within the electronic MEDICAL RECORD NUMBER Notes from prior ED visits  ----------------------------------------- 2:43 PM on 02/10/2017 -----------------------------------------  Patient at this time with likely source of pneumonia to the right lower field.  Broad-spectrum antibiotics have been ordered and a sepsis alert was called.  The mother is understanding the patient will need to be admitted to the hospital.  Signed out to Dr. Johny Drilling of the hospitalist service.  The family is understanding of this plan as well as the diagnosis and willing to comply.  Seizure likely secondary to ongoing seizures over the past month and likely exacerbated by infectious source.  Still pending flu and urine.  However, there appears to be infectious source found in the chest which the patient has had previously.     ____________________________________________   FINAL CLINICAL IMPRESSION(S) / ED DIAGNOSES  Sepsis.  Right lower lobe pneumonia.    NEW MEDICATIONS STARTED DURING THIS VISIT:  New Prescriptions   No medications on file     Note:  This document was prepared using Dragon voice recognition software and may include unintentional dictation errors.     Myrna Blazer, MD 02/10/17 [phone]

## 2017-02-10 NOTE — Progress Notes (Signed)
Pharmacy Antibiotic Note  Noah Marshall is a 22 y.o. male with a h/o autism and epilepsy admitted on 02/10/2017 with sepsis.  Pharmacy has been consulted for vancomycin and Zosyn dosing.  Plan: Ke= 0.104 h-1 Vd= 45.5 L  Vancomycin 1000 mg iv q 8 hours. Trough scheduled with the 5th dose. Goal trough 15-20 mcg/ml.   Zosyn 3.375 g EI q 8 hours.   Height: 5\' 6"  (167.6 cm) Weight: 145 lb (65.8 kg) IBW/kg (Calculated) : 63.8  Temp (24hrs), Avg:102.7 F (39.3 C), Min:102.7 F (39.3 C), Max:102.7 F (39.3 C)  Recent Labs  Lab 02/10/17 1348 02/10/17 1351  WBC  --  10.9*  CREATININE  --  0.80  LATICACIDVEN 1.2  --     Estimated Creatinine Clearance: 131.8 mL/min (by C-G formula based on SCr of 0.8 mg/dL).    Allergies  Allergen Reactions  . Lactose Intolerance (Gi)   . Peanut-Containing Drug Products     Antimicrobials this admission: vancomycin 1/14 >>  Zosyn 1/14 >>   Dose adjustments this admission:   Microbiology results: 1/14 BCx: sent 1/14 UCx: sent   Thank you for allowing pharmacy to be a part of this patient's care.  Luisa HartChristy, Johnisha Louks D 02/10/2017 3:14 PM

## 2017-02-11 DIAGNOSIS — R569 Unspecified convulsions: Secondary | ICD-10-CM

## 2017-02-11 LAB — CBC
HCT: 28.2 % — ABNORMAL LOW (ref 40.0–52.0)
Hemoglobin: 9.7 g/dL — ABNORMAL LOW (ref 13.0–18.0)
MCH: 30.2 pg (ref 26.0–34.0)
MCHC: 34.3 g/dL (ref 32.0–36.0)
MCV: 88 fL (ref 80.0–100.0)
Platelets: 62 10*3/uL — ABNORMAL LOW (ref 150–440)
RBC: 3.21 MIL/uL — ABNORMAL LOW (ref 4.40–5.90)
RDW: 14.4 % (ref 11.5–14.5)
WBC: 9.2 10*3/uL (ref 3.8–10.6)

## 2017-02-11 LAB — BLOOD CULTURE ID PANEL (REFLEXED)

## 2017-02-11 LAB — BASIC METABOLIC PANEL
Anion gap: 11 (ref 5–15)
BUN: 21 mg/dL — ABNORMAL HIGH (ref 6–20)
CO2: 21 mmol/L — ABNORMAL LOW (ref 22–32)
Calcium: 8.7 mg/dL — ABNORMAL LOW (ref 8.9–10.3)
Chloride: 97 mmol/L — ABNORMAL LOW (ref 101–111)
Creatinine, Ser: 0.68 mg/dL (ref 0.61–1.24)
GFR calc Af Amer: >60 mL/min (ref >60)
GFR calc non Af Amer: >60 mL/min (ref >60)
Glucose, Bld: 85 mg/dL (ref 65–99)
Potassium: 3.9 mmol/L (ref 3.5–5.1)
Sodium: 129 mmol/L — ABNORMAL LOW (ref 135–145)

## 2017-02-11 MED ORDER — CLONAZEPAM 0.5 MG PO TABS
0.5000 mg | ORAL_TABLET | Freq: Three times a day (TID) | ORAL | Status: DC
  Administered 2017-02-11 – 2017-02-14 (×9): 0.5 mg via ORAL
  Filled 2017-02-11 (×9): qty 1

## 2017-02-11 MED ORDER — CLOBAZAM 2.5 MG/ML PO SUSP
15.0000 mg | Freq: Two times a day (BID) | ORAL | Status: DC
  Filled 2017-02-11 (×2): qty 8

## 2017-02-11 MED ORDER — CLOBAZAM 10 MG PO TABS
15.0000 mg | ORAL_TABLET | Freq: Two times a day (BID) | ORAL | Status: DC
  Administered 2017-02-11 – 2017-02-14 (×7): 15 mg via ORAL
  Filled 2017-02-11 (×7): qty 2

## 2017-02-11 MED ORDER — TOPIRAMATE 100 MG PO TABS
200.0000 mg | ORAL_TABLET | Freq: Two times a day (BID) | ORAL | Status: DC
  Administered 2017-02-11 – 2017-02-14 (×7): 200 mg
  Filled 2017-02-11 (×7): qty 2

## 2017-02-11 NOTE — Progress Notes (Signed)
[employer] - [place] at [facility]                                                                                                                                                                                  Patient Demographics   Noah Marshall, is a 22 y.o. male, DOB - [birth year], BJY:[id number]  Admit date - 02/10/2017   Admitting Physician Shaune Pollack, MD  Outpatient Primary MD for the patient is Fisher, Demetrios Isaacs, MD   LOS - 1  Subjective: Patient with autism unable to provide any review of systems Mother at bedside    Review of Systems:   CONSTITUTIONAL: Able to provide due to his chronic autism Vitals:   Vitals:   02/10/17 2157 02/11/17 0012 02/11/17 0617 02/11/17 1300  BP: 123/76  (!) 110/57 111/62  Pulse: 80  (!) 105 (!) 104  Resp: 18  18 17   Temp: (!) 102.6 F (39.2 C) 99.7 F (37.6 C) 99.6 F (37.6 C) 98.2 F (36.8 C)  TempSrc: Axillary Axillary Axillary Axillary  SpO2: 99%  92% 93%  Weight:      Height:        Wt Readings from Last 3 Encounters:  02/10/17 145 lb (65.8 kg)  09/26/15 174 lb (78.9 kg)  09/14/15 183 lb 1.6 oz (83.1 kg)     Intake/Output Summary (Last 24 hours) at 02/11/2017 1601 Last data filed at 02/11/2017 0536 Gross per 24 hour  Intake 1738.4 ml  Output -  Net 1738.4 ml    Physical Exam:   GENERAL: Pleasant-appearing in no apparent distress.  HEAD, EYES, EARS, NOSE AND THROAT: Atraumatic, normocephalic. . Pupils equal and reactive to light. Sclerae anicteric. No conjunctival injection. No oro-pharyngeal erythema.  NECK: Supple. There is no jugular venous distention. No bruits, no lymphadenopathy, no thyromegaly.  HEART: Rhonchus breath sounds LUNGS: Clear to auscultation bilaterally. No rales or rhonchi. No wheezes.  ABDOMEN: Soft, flat, nontender, nondistended. Has good bowel sounds. No hepatosplenomegaly appreciated.  EXTREMITIES: No evidence of any cyanosis, clubbing, or peripheral edema.  +2 pedal and radial  pulses bilaterally.  NEUROLOGIC: The patient is alert, awake, and oriented x3 with no focal motor or sensory deficits appreciated bilaterally.  SKIN: Moist and warm with no rashes appreciated.  Psych: Not anxious, depressed LN: No inguinal LN enlargement    Antibiotics   Anti-infectives (From admission, onward)   Start     Dose/Rate Route Frequency Ordered Stop   02/10/17 2230  vancomycin (VANCOCIN) IVPB 1000 mg/200 mL premix  Status:  Discontinued     1,000 mg 200 mL/hr over 60 Minutes Intravenous Every 8 hours 02/10/17 1513 02/11/17 0626   02/10/17 2030  piperacillin-tazobactam (ZOSYN) IVPB 3.375 g     3.375 g 12.5 mL/hr over 240 Minutes Intravenous Every 8 hours 02/10/17 1513     02/10/17 1400  piperacillin-tazobactam (ZOSYN) IVPB 3.375 g     3.375 g 100 mL/hr over 30 Minutes Intravenous  Once 02/10/17 1352 02/10/17 1519   02/10/17 1400  vancomycin (VANCOCIN) IVPB 1000 mg/200 mL premix     1,000 mg 200 mL/hr over 60 Minutes Intravenous  Once 02/10/17 1352 02/10/17 1558      Medications   Scheduled Meds: . cloBAZam  15 mg Oral BID  . clonazePAM  0.5 mg Oral TID  . enoxaparin (LOVENOX) injection  40 mg Subcutaneous Q24H  . fluticasone  2 spray Each Nare Daily  . Influenza vac split quadrivalent PF  0.5 mL Intramuscular Tomorrow-1000  . levOCARNitine  500 mg Oral BID  . montelukast  10 mg Oral QHS  . OXcarbazepine  600 mg Oral BID  . topiramate  200 mg Per Tube BID  . Valproate Sodium  500 mg Oral BID   Continuous Infusions: . sodium chloride 50 mL/hr at 02/11/17 1505  . piperacillin-tazobactam (ZOSYN)  IV 3.375 g (02/11/17 1451)   PRN Meds:.acetaminophen **OR** acetaminophen, albuterol, bisacodyl, guaiFENesin, HYDROcodone-acetaminophen, LORazepam, ondansetron **OR** ondansetron (ZOFRAN) IV, senna-docusate   Data Review:   Micro Results Recent Results (from the past 240 hour(s))  Blood Culture (routine x 2)     Status: None (Preliminary result)   Collection Time:  02/10/17  1:51 PM  Result Value Ref Range Status   Specimen Description   Final    BLOOD LEFT EJ Performed at [facility], [address], [place], [place] [address]    Special Requests   Final    BOTTLES DRAWN AEROBIC AND ANAEROBIC Blood Culture adequate volume Performed at [facility], [address] [place], [place] [address]    Culture  Setup Time   Final    GRAM NEGATIVE RODS IN BOTH AEROBIC AND ANAEROBIC BOTTLES CRITICAL RESULT CALLED TO, READ BACK BY AND VERIFIED WITH: DAVID BESANTI AT 0437 ON 02/11/17 [facility].    Culture GRAM NEGATIVE RODS  Final   Report Status PENDING  Incomplete  Blood Culture ID Panel (Reflexed)     Status: Abnormal   Collection Time: 02/10/17  1:51 PM  Result Value Ref Range Status   Enterococcus species NOT DETECTED NOT DETECTED Final   Listeria monocytogenes NOT DETECTED NOT DETECTED Final   Staphylococcus species NOT DETECTED NOT DETECTED Final   Staphylococcus aureus NOT DETECTED NOT DETECTED Final   Streptococcus species NOT DETECTED NOT DETECTED Final   Streptococcus agalactiae NOT DETECTED NOT DETECTED Final   Streptococcus pneumoniae NOT DETECTED NOT DETECTED Final   Streptococcus pyogenes NOT DETECTED NOT DETECTED Final   Acinetobacter baumannii NOT DETECTED NOT DETECTED Final   Enterobacteriaceae species DETECTED (A) NOT DETECTED Final    Comment: Enterobacteriaceae represent a large family of gram negative bacteria, not a single organism. Refer to culture for further identification. CRITICAL RESULT CALLED TO, READ BACK BY AND VERIFIED WITH: DAVID BESANTI AT 0437 ON 02/11/17 [facility].    Enterobacter cloacae complex NOT DETECTED NOT DETECTED Final   Escherichia coli NOT DETECTED NOT DETECTED Final   Klebsiella oxytoca NOT DETECTED NOT DETECTED Final   Klebsiella pneumoniae NOT DETECTED NOT DETECTED Final   Proteus species NOT DETECTED NOT DETECTED Final   Serratia marcescens NOT DETECTED NOT DETECTED Final   Carbapenem  resistance NOT DETECTED NOT DETECTED Final   Haemophilus  influenzae NOT DETECTED NOT DETECTED Final   Neisseria meningitidis NOT DETECTED NOT DETECTED Final   Pseudomonas aeruginosa NOT DETECTED NOT DETECTED Final   Candida albicans NOT DETECTED NOT DETECTED Final   Candida glabrata NOT DETECTED NOT DETECTED Final   Candida krusei NOT DETECTED NOT DETECTED Final   Candida parapsilosis NOT DETECTED NOT DETECTED Final   Candida tropicalis NOT DETECTED NOT DETECTED Final    Comment: Performed at [facility], [address] [place], [place] [address]  Blood Culture (routine x 2)     Status: None (Preliminary result)   Collection Time: 02/10/17  1:56 PM  Result Value Ref Range Status   Specimen Description   Final    BLOOD LEFT UPPER ARM Performed at [facility], [address], [place], [place] [address]    Special Requests   Final    BOTTLES DRAWN AEROBIC AND ANAEROBIC Blood Culture adequate volume Performed at [facility], [address] [place], [place] [address]    Culture  Setup Time   Final    GRAM NEGATIVE RODS IN BOTH AEROBIC AND ANAEROBIC BOTTLES CRITICAL VALUE NOTED.  VALUE IS CONSISTENT WITH PREVIOUSLY REPORTED AND CALLED VALUE.    Culture GRAM NEGATIVE RODS  Final   Report Status PENDING  Incomplete    Radiology Reports Ct Head Wo Contrast  Result Date: 02/10/2017 CLINICAL DATA:  22 year old with seizure.  Initial encounter. EXAM: CT HEAD WITHOUT CONTRAST TECHNIQUE: Contiguous axial images were obtained from the base of the skull through the vertex without intravenous contrast. COMPARISON:  09/13/2015, 12/27/2012 and 12/24/2011. FINDINGS: Brain: No intracranial hemorrhage or CT evidence of large acute infarct. 3 x 1.6 x 1.7 cm cystic structure medial to the right hippocampus (deformity hippocampus) unchanged from prior exams. Global atrophy. Falx calcifications Vascular: New negative Skull: Negative Sinuses/Orbits: Visualized orbital  structures acute abnormality noted. Minimal mucosal thickening ethmoid sinus air cells and maxillary sinuses. Other: Mastoid air cells and middle ear cavities are clear. IMPRESSION: No intracranial hemorrhage or CT evidence of large acute infarct. Stable 3 x 1.6 x 1.7 cm cystic structure medial to the right hippocampus (deformity hippocampus). If further delineation is clinically desired, MR can be obtained. Global atrophy. Electronically Signed   By: Lacy Duverney M.D.   On: 02/10/2017 14:25   Dg Chest Port 1 View  Result Date: 02/10/2017 CLINICAL DATA:  Seizures. EXAM: PORTABLE CHEST 1 VIEW COMPARISON:  Single-view of the chest 09/14/2015. FINDINGS: Lung volumes are low. A small focus of airspace opacity is seen in the right base. The left lung is clear. No pneumothorax or pleural effusion. Heart size is normal. Stimulator device with lead extending into the neck is unchanged. IMPRESSION: Small focus of hazy airspace in the right base in this low volume chest is likely atelectasis but could be due to aspiration or pneumonia. The exam is otherwise negative. Electronically Signed   By: Drusilla Kanner M.D.   On: 02/10/2017 14:30     CBC Recent Labs  Lab 02/10/17 1351 02/11/17 0515  WBC 10.9* 9.2  HGB 10.9* 9.7*  HCT 31.3* 28.2*  PLT 97* 62*  MCV 88.1 88.0  MCH 30.6 30.2  MCHC 34.7 34.3  RDW 14.1 14.4  LYMPHSABS 0.3*  --   MONOABS 1.9*  --   EOSABS 0.0  --   BASOSABS 0.0  --     Chemistries  Recent Labs  Lab 02/10/17 1351 02/11/17 0515  NA 126* 129*  K 4.0 3.9  CL 92*  97*  CO2 22 21*  GLUCOSE 98 85  BUN 22* 21*  CREATININE 0.80 0.68  CALCIUM 9.1 8.7*  AST 17  --   ALT 7*  --   ALKPHOS 53  --   BILITOT 0.5  --    ------------------------------------------------------------------------------------------------------------------ estimated creatinine clearance is 131.8 mL/min (by C-G formula based on SCr of 0.68  mg/dL). ------------------------------------------------------------------------------------------------------------------ No results for input(s): HGBA1C in the last 72 hours. ------------------------------------------------------------------------------------------------------------------ No results for input(s): CHOL, HDL, LDLCALC, TRIG, CHOLHDL, LDLDIRECT in the last 72 hours. ------------------------------------------------------------------------------------------------------------------ No results for input(s): TSH, T4TOTAL, T3FREE, THYROIDAB in the last 72 hours.  Invalid input(s): FREET3 ------------------------------------------------------------------------------------------------------------------ No results for input(s): VITAMINB12, FOLATE, FERRITIN, TIBC, IRON, RETICCTPCT in the last 72 hours.  Coagulation profile Recent Labs  Lab 02/10/17 1832  INR 1.03    No results for input(s): DDIMER in the last 72 hours.  Cardiac Enzymes No results for input(s): CKMB, TROPONINI, MYOGLOBIN in the last 168 hours.  Invalid input(s): CK ------------------------------------------------------------------------------------------------------------------ Invalid input(s): POCBNP    Assessment & Plan  Patient is a 22 year old with history of autism being admitted with sepsis  #1Sepsis due to aspiration pneumonia.  As well as UTI Continue IV Zosyn await urine cultures #2 UTI with gram-negative rods with positive blood cultures Continue IV Zosyn #3Seizure activity.  Appreciate neurology input we will restart all his home regimen #4 chronic hyponatremia.  Continue IV fluids. #5 chronic thrombocytopenia.  Stable.         Code Status Orders  (From admission, onward)        Start     Ordered   02/10/17 1653  Full code  Continuous     02/10/17 1652    Code Status History    Date Active Date Inactive Code Status Order ID Comments User Context   09/13/2015 06:23 09/14/2015  19:33 Full Code [id number]  Arnaldo Nataliamond, Michael S, MD Inpatient           Consults none  DVT Prophylaxis SCDs  Lab Results  Component Value Date   PLT 62 (L) 02/11/2017     Time Spent in minutes 35 minutes greater than 50% of time spent in care coordination and counseling patient regarding the condition and plan of care.   Auburn BilberryPATEL, Ouida Abeyta M.D on 02/11/2017 at 4:01 PM  Between 7am to 6pm - Pager - [phone]  After 6pm go to [email or web address] - password [email or web address]  [facility]   Office  [phone]

## 2017-02-11 NOTE — Progress Notes (Signed)
PHARMACY - PHYSICIAN COMMUNICATION CRITICAL VALUE ALERT - BLOOD CULTURE IDENTIFICATION (BCID)  Noah Marshall is an 22 y.o. male who [facility] to [facility] on 02/10/2017 with a chief complaint of seizures and AMS  Assessment:  Febrile, tachycardic, tachypneic, WBC 10.9, LA 2.2 CXR concerning for atelectasis s/t PNA, PCT 1.99 (include suspected source if known)  Name of physician (or Provider) Contacted: Joycelyn RuaMichael Diamond  Current antibiotics: Vanc/Zosyn  Changes to prescribed antibiotics recommended:  Recommendations accepted by provider -- will discontinue vancomycin and continue zosyn for possible aspiration PNA -- will adjust abx per blood cx's  Results for orders placed or performed during the hospital encounter of 02/10/17  Blood Culture ID Panel (Reflexed) (Collected: 02/10/2017  1:51 PM)  Result Value Ref Range   Enterococcus species NOT DETECTED NOT DETECTED   Listeria monocytogenes NOT DETECTED NOT DETECTED   Staphylococcus species NOT DETECTED NOT DETECTED   Staphylococcus aureus NOT DETECTED NOT DETECTED   Streptococcus species NOT DETECTED NOT DETECTED   Streptococcus agalactiae NOT DETECTED NOT DETECTED   Streptococcus pneumoniae NOT DETECTED NOT DETECTED   Streptococcus pyogenes NOT DETECTED NOT DETECTED   Acinetobacter baumannii NOT DETECTED NOT DETECTED   Enterobacteriaceae species DETECTED (A) NOT DETECTED   Enterobacter cloacae complex NOT DETECTED NOT DETECTED   Escherichia coli NOT DETECTED NOT DETECTED   Klebsiella oxytoca NOT DETECTED NOT DETECTED   Klebsiella pneumoniae NOT DETECTED NOT DETECTED   Proteus species NOT DETECTED NOT DETECTED   Serratia marcescens NOT DETECTED NOT DETECTED   Carbapenem resistance NOT DETECTED NOT DETECTED   Haemophilus influenzae NOT DETECTED NOT DETECTED   Neisseria meningitidis NOT DETECTED NOT DETECTED   Pseudomonas aeruginosa NOT DETECTED NOT DETECTED   Candida albicans NOT DETECTED NOT DETECTED   Candida glabrata NOT  DETECTED NOT DETECTED   Candida krusei NOT DETECTED NOT DETECTED   Candida parapsilosis NOT DETECTED NOT DETECTED   Candida tropicalis NOT DETECTED NOT DETECTED    Thomasene Rippleavid Mysti Haley, PharmD, BCPS Clinical Pharmacist 02/11/2017

## 2017-02-11 NOTE — Plan of Care (Signed)
Pt is progressing. Receiving IV ABX. Pt has taken meds okay.

## 2017-02-11 NOTE — Consult Note (Signed)
Reason for Consult: seizures Referring Physician: Dr. Imogene Burn   CC: seizures   HPI: Noah Marshall is an 22 y.o. male   with a known history of seizure disorder presented with aspiration pneumonia.  The patient is nonverbal and noncommunicative.  Pt presented with tachycardia and elevated temps.  Day prior to admission had seizure episode.     Past Medical History:  Diagnosis Date  . Aspiration pneumonia (HCC)   . Autism   . Epilepsy (HCC)   . Epilepsy with status epilepticus (HCC)   . Intracranial arachnoid cysts   . Pes planovalgus   . Premature ventricular contractions   . Respiratory failure [facility])     Past Surgical History:  Procedure Laterality Date  . FOREIGN BODY REMOVAL N/A 03/08/2015   Procedure: FOREIGN BODY REMOVAL;  Surgeon: Elnita Maxwell, MD;  Location: [facility] ENDOSCOPY;  Service: Endoscopy;  Laterality: N/A;  . IMPLANTATION VAGAL NERVE STIMULATOR  2011  . REPLACE / REVISE VAGAL NERVE STIMULATOR      Family History  Problem Relation Age of Onset  . Diabetes Mellitus II Father   . Hypertension Father     Social History:  reports that  has never smoked. he has never used smokeless tobacco. He reports that he does not drink alcohol or use drugs.  Allergies  Allergen Reactions  . Lactose Intolerance (Gi) Diarrhea  . Peanut-Containing Drug Products Hives    Medications: I have reviewed the patient's current medications.  ROS: Unable to obtain due to not following commands   Physical Examination: Blood pressure (!) 110/57, pulse (!) 105, temperature 99.6 F (37.6 C), temperature source Axillary, resp. rate 18, height 5\' 6"  (1.676 m), weight 145 lb (65.8 kg), SpO2 92 %.  Opens eyes Moves all his extremities but weak in legs Pupils/corneals intact.   Laboratory Studies:   Basic Metabolic Panel: Recent Labs  Lab 02/10/17 1351 02/11/17 0515  NA 126* 129*  K 4.0 3.9  CL 92* 97*  CO2 22 21*  GLUCOSE 98 85  BUN 22* 21*  CREATININE 0.80 0.68  CALCIUM  9.1 8.7*    Liver Function Tests: Recent Labs  Lab 02/10/17 1351  AST 17  ALT 7*  ALKPHOS 53  BILITOT 0.5  PROT 6.9  ALBUMIN 3.5   No results for input(s): LIPASE, AMYLASE in the last 168 hours. No results for input(s): AMMONIA in the last 168 hours.  CBC: Recent Labs  Lab 02/10/17 1351 02/11/17 0515  WBC 10.9* 9.2  NEUTROABS 8.7*  --   HGB 10.9* 9.7*  HCT 31.3* 28.2*  MCV 88.1 88.0  PLT 97* 62*    Cardiac Enzymes: No results for input(s): CKTOTAL, CKMB, CKMBINDEX, TROPONINI in the last 168 hours.  BNP: Invalid input(s): POCBNP  CBG: No results for input(s): GLUCAP in the last 168 hours.  Microbiology: Results for orders placed or performed during the hospital encounter of 02/10/17  Blood Culture (routine x 2)     Status: None (Preliminary result)   Collection Time: 02/10/17  1:51 PM  Result Value Ref Range Status   Specimen Description   Final    BLOOD LEFT EJ Performed at [facility], [address], [place], [place] [address]    Special Requests   Final    BOTTLES DRAWN AEROBIC AND ANAEROBIC Blood Culture adequate volume Performed at [facility], [address], [place], [place] [address]    Culture  Setup Time   Final    GRAM NEGATIVE RODS IN  BOTH AEROBIC AND ANAEROBIC BOTTLES CRITICAL RESULT CALLED TO, READ BACK BY AND VERIFIED WITH: DAVID BESANTI AT 0437 ON 02/11/17 [facility].    Culture GRAM NEGATIVE RODS  Final   Report Status PENDING  Incomplete  Blood Culture ID Panel (Reflexed)     Status: Abnormal   Collection Time: 02/10/17  1:51 PM  Result Value Ref Range Status   Enterococcus species NOT DETECTED NOT DETECTED Final   Listeria monocytogenes NOT DETECTED NOT DETECTED Final   Staphylococcus species NOT DETECTED NOT DETECTED Final   Staphylococcus aureus NOT DETECTED NOT DETECTED Final   Streptococcus species NOT DETECTED NOT DETECTED Final   Streptococcus agalactiae NOT DETECTED NOT DETECTED Final   Streptococcus  pneumoniae NOT DETECTED NOT DETECTED Final   Streptococcus pyogenes NOT DETECTED NOT DETECTED Final   Acinetobacter baumannii NOT DETECTED NOT DETECTED Final   Enterobacteriaceae species DETECTED (A) NOT DETECTED Final    Comment: Enterobacteriaceae represent a large family of gram negative bacteria, not a single organism. Refer to culture for further identification. CRITICAL RESULT CALLED TO, READ BACK BY AND VERIFIED WITH: DAVID BESANTI AT 0437 ON 02/11/17 [facility].    Enterobacter cloacae complex NOT DETECTED NOT DETECTED Final   Escherichia coli NOT DETECTED NOT DETECTED Final   Klebsiella oxytoca NOT DETECTED NOT DETECTED Final   Klebsiella pneumoniae NOT DETECTED NOT DETECTED Final   Proteus species NOT DETECTED NOT DETECTED Final   Serratia marcescens NOT DETECTED NOT DETECTED Final   Carbapenem resistance NOT DETECTED NOT DETECTED Final   Haemophilus influenzae NOT DETECTED NOT DETECTED Final   Neisseria meningitidis NOT DETECTED NOT DETECTED Final   Pseudomonas aeruginosa NOT DETECTED NOT DETECTED Final   Candida albicans NOT DETECTED NOT DETECTED Final   Candida glabrata NOT DETECTED NOT DETECTED Final   Candida krusei NOT DETECTED NOT DETECTED Final   Candida parapsilosis NOT DETECTED NOT DETECTED Final   Candida tropicalis NOT DETECTED NOT DETECTED Final    Comment: Performed at [facility], [address] [place], [place] [address]  Blood Culture (routine x 2)     Status: None (Preliminary result)   Collection Time: 02/10/17  1:56 PM  Result Value Ref Range Status   Specimen Description   Final    BLOOD LEFT UPPER ARM Performed at [facility], [address] [place], [place] [address]    Special Requests   Final    BOTTLES DRAWN AEROBIC AND ANAEROBIC Blood Culture adequate volume Performed at [facility], [address] [place], [place] [address]    Culture  Setup Time   Final    GRAM NEGATIVE RODS IN BOTH AEROBIC AND ANAEROBIC  BOTTLES CRITICAL VALUE NOTED.  VALUE IS CONSISTENT WITH PREVIOUSLY REPORTED AND CALLED VALUE.    Culture GRAM NEGATIVE RODS  Final   Report Status PENDING  Incomplete    Coagulation Studies: Recent Labs    02/10/17 1832  LABPROT 13.4  INR 1.03    Urinalysis:  Recent Labs  Lab 02/10/17 1424  COLORURINE AMBER*  LABSPEC 1.020  PHURINE 7.0  GLUCOSEU NEGATIVE  HGBUR LARGE*  BILIRUBINUR NEGATIVE  KETONESUR 5*  PROTEINUR 100*  NITRITE NEGATIVE  LEUKOCYTESUR MODERATE*    Lipid Panel:  No results found for: CHOL, TRIG, HDL, CHOLHDL, VLDL, LDLCALC  HgbA1C:  Lab Results  Component Value Date   HGBA1C 4.4 09/13/2015    Urine Drug Screen:  No results found for: LABOPIA, COCAINSCRNUR, LABBENZ, AMPHETMU, THCU, LABBARB  Alcohol Level: No results for input(s): [facility]  in the last 168 hours.  Other results: EKG: normal EKG, normal sinus rhythm, unchanged from previous tracings.  Imaging: Ct Head Wo Contrast  Result Date: 02/10/2017 CLINICAL DATA:  22 year old with seizure.  Initial encounter. EXAM: CT HEAD WITHOUT CONTRAST TECHNIQUE: Contiguous axial images were obtained from the base of the skull through the vertex without intravenous contrast. COMPARISON:  09/13/2015, 12/27/2012 and 12/24/2011. FINDINGS: Brain: No intracranial hemorrhage or CT evidence of large acute infarct. 3 x 1.6 x 1.7 cm cystic structure medial to the right hippocampus (deformity hippocampus) unchanged from prior exams. Global atrophy. Falx calcifications Vascular: New negative Skull: Negative Sinuses/Orbits: Visualized orbital structures acute abnormality noted. Minimal mucosal thickening ethmoid sinus air cells and maxillary sinuses. Other: Mastoid air cells and middle ear cavities are clear. IMPRESSION: No intracranial hemorrhage or CT evidence of large acute infarct. Stable 3 x 1.6 x 1.7 cm cystic structure medial to the right hippocampus (deformity hippocampus). If further delineation is clinically desired, MR  can be obtained. Global atrophy. Electronically Signed   By: Lacy Duverney M.D.   On: 02/10/2017 14:25   Dg Chest Port 1 View  Result Date: 02/10/2017 CLINICAL DATA:  Seizures. EXAM: PORTABLE CHEST 1 VIEW COMPARISON:  Single-view of the chest 09/14/2015. FINDINGS: Lung volumes are low. A small focus of airspace opacity is seen in the right base. The left lung is clear. No pneumothorax or pleural effusion. Heart size is normal. Stimulator device with lead extending into the neck is unchanged. IMPRESSION: Small focus of hazy airspace in the right base in this low volume chest is likely atelectasis but could be due to aspiration or pneumonia. The exam is otherwise negative. Electronically Signed   By: Drusilla Kanner M.D.   On: 02/10/2017 14:30     Assessment/Plan:  22 y.o. male   with a known history of seizure disorder presented with aspiration pneumonia.  The patient is nonverbal and noncommunicative.  Pt presented with tachycardia and elevated temps.  Day prior to admission had seizure episode.    - sz likely in setting of infection  - pt is on Onfi, trileptal, levocarnitine, depakote, clonipin, topamax - please restart all home meds - no need for Keppra as seizures are provoked - Infection treatment as per primary team - d/w mother at bedside.   02/11/2017, 12:09 PM

## 2017-02-12 LAB — BASIC METABOLIC PANEL
Anion gap: 10 (ref 5–15)
BUN: 20 mg/dL (ref 6–20)
CO2: 22 mmol/L (ref 22–32)
Calcium: 8.6 mg/dL — ABNORMAL LOW (ref 8.9–10.3)
Chloride: 101 mmol/L (ref 101–111)
Creatinine, Ser: 0.55 mg/dL — ABNORMAL LOW (ref 0.61–1.24)
GFR calc Af Amer: >60 mL/min (ref >60)
GFR calc non Af Amer: >60 mL/min (ref >60)
Glucose, Bld: 80 mg/dL (ref 65–99)
Potassium: 3.6 mmol/L (ref 3.5–5.1)
Sodium: 133 mmol/L — ABNORMAL LOW (ref 135–145)

## 2017-02-12 LAB — CBC
HCT: 26.1 % — ABNORMAL LOW (ref 40.0–52.0)
Hemoglobin: 9.2 g/dL — ABNORMAL LOW (ref 13.0–18.0)
MCH: 30.9 pg (ref 26.0–34.0)
MCHC: 35.3 g/dL (ref 32.0–36.0)
MCV: 87.4 fL (ref 80.0–100.0)
Platelets: 64 10*3/uL — ABNORMAL LOW (ref 150–440)
RBC: 2.98 MIL/uL — ABNORMAL LOW (ref 4.40–5.90)
RDW: 13.8 % (ref 11.5–14.5)
WBC: 7.8 10*3/uL (ref 3.8–10.6)

## 2017-02-12 LAB — VITAMIN D 25 HYDROXY (VIT D DEFICIENCY, FRACTURES): Vit D, 25-Hydroxy: 7 ng/mL — ABNORMAL LOW (ref 30.0–100.0)

## 2017-02-12 LAB — HIV ANTIBODY (ROUTINE TESTING W REFLEX): HIV Screen 4th Generation wRfx: NONREACTIVE

## 2017-02-12 MED ORDER — VITAMIN D (ERGOCALCIFEROL) 1.25 MG (50000 UNIT) PO CAPS
50000.0000 [IU] | ORAL_CAPSULE | ORAL | Status: DC
  Administered 2017-02-12: 50000 [IU] via ORAL
  Filled 2017-02-12: qty 1

## 2017-02-12 NOTE — Progress Notes (Signed)
[employer] - [place] at [facility]                                                                                                                                                                                  Patient Demographics   Noah Marshall, is a 22 y.o. male, DOB - [birth year], ZOX:[id number]  Admit date - 02/10/2017   Admitting Physician Shaune Pollack, MD  Outpatient Primary MD for the patient is Fisher, Demetrios Isaacs, MD   LOS - 2  Subjective: Patient with autism unable to provide any review of systems He will staying a little drowsy    Review of Systems:   CONSTITUTIONAL: Able to provide due to his chronic autism Vitals:   Vitals:   02/11/17 1300 02/11/17 2205 02/12/17 0523 02/12/17 1212  BP: 111/62 131/74 114/67 (!) 103/58  Pulse: (!) 104 (!) 115 98 94  Resp: 17 18 16 20   Temp: 98.2 F (36.8 C) 100.3 F (37.9 C) 98.3 F (36.8 C) 98.2 F (36.8 C)  TempSrc: Axillary Oral Oral Axillary  SpO2: 93% 99% 100% 95%  Weight:      Height:        Wt Readings from Last 3 Encounters:  02/10/17 145 lb (65.8 kg)  09/26/15 174 lb (78.9 kg)  09/14/15 183 lb 1.6 oz (83.1 kg)     Intake/Output Summary (Last 24 hours) at 02/12/2017 1606 Last data filed at 02/12/2017 0800 Gross per 24 hour  Intake 2589 ml  Output 0 ml  Net 2589 ml    Physical Exam:   GENERAL: Pleasant-appearing in no apparent distress.  HEAD, EYES, EARS, NOSE AND THROAT: Atraumatic, normocephalic. . Pupils equal and reactive to light. Sclerae anicteric. No conjunctival injection. No oro-pharyngeal erythema.  NECK: Supple. There is no jugular venous distention. No bruits, no lymphadenopathy, no thyromegaly.  HEART: Rhonchus breath sounds LUNGS: Clear to auscultation bilaterally. No rales or rhonchi. No wheezes.  ABDOMEN: Soft, flat, nontender, nondistended. Has good bowel sounds. No hepatosplenomegaly appreciated.  EXTREMITIES: No evidence of any cyanosis, clubbing, or peripheral edema.  +2 pedal  and radial pulses bilaterally.  NEUROLOGIC: The patient is alert, awake, and oriented x3 with no focal motor or sensory deficits appreciated bilaterally.  SKIN: Moist and warm with no rashes appreciated.  Psych: Not anxious, depressed LN: No inguinal LN enlargement    Antibiotics   Anti-infectives (From admission, onward)   Start     Dose/Rate Route Frequency Ordered Stop   02/10/17 2230  vancomycin (VANCOCIN) IVPB 1000 mg/200 mL premix  Status:  Discontinued     1,000 mg 200 mL/hr over 60 Minutes Intravenous Every 8 hours 02/10/17 1513 02/11/17 0626  02/10/17 2030  piperacillin-tazobactam (ZOSYN) IVPB 3.375 g     3.375 g 12.5 mL/hr over 240 Minutes Intravenous Every 8 hours 02/10/17 1513     02/10/17 1400  piperacillin-tazobactam (ZOSYN) IVPB 3.375 g     3.375 g 100 mL/hr over 30 Minutes Intravenous  Once 02/10/17 1352 02/10/17 1519   02/10/17 1400  vancomycin (VANCOCIN) IVPB 1000 mg/200 mL premix     1,000 mg 200 mL/hr over 60 Minutes Intravenous  Once 02/10/17 1352 02/10/17 1558      Medications   Scheduled Meds: . cloBAZam  15 mg Oral BID  . clonazePAM  0.5 mg Oral TID  . enoxaparin (LOVENOX) injection  40 mg Subcutaneous Q24H  . fluticasone  2 spray Each Nare Daily  . levOCARNitine  500 mg Oral BID  . montelukast  10 mg Oral QHS  . OXcarbazepine  600 mg Oral BID  . topiramate  200 mg Per Tube BID  . Valproate Sodium  500 mg Oral BID  . Vitamin D (Ergocalciferol)  50,000 Units Oral Q7 days   Continuous Infusions: . sodium chloride 50 mL/hr at 02/12/17 0857  . piperacillin-tazobactam (ZOSYN)  IV 3.375 g (02/12/17 1320)   PRN Meds:.acetaminophen **OR** acetaminophen, albuterol, bisacodyl, guaiFENesin, HYDROcodone-acetaminophen, LORazepam, ondansetron **OR** ondansetron (ZOFRAN) IV, senna-docusate   Data Review:   Micro Results Recent Results (from the past 240 hour(s))  Blood Culture (routine x 2)     Status: Abnormal (Preliminary result)   Collection Time:  02/10/17  1:51 PM  Result Value Ref Range Status   Specimen Description   Final    BLOOD LEFT EJ Performed at [facility], [address] [place], [place] [address]    Special Requests   Final    BOTTLES DRAWN AEROBIC AND ANAEROBIC Blood Culture adequate volume Performed at [facility], [address] [place], [place] [address]    Culture  Setup Time   Final    GRAM NEGATIVE RODS IN BOTH AEROBIC AND ANAEROBIC BOTTLES CRITICAL RESULT CALLED TO, READ BACK BY AND VERIFIED WITH: DAVID BESANTI AT 0437 ON 02/11/17 [facility].    Culture (A)  Final    CITROBACTER KOSERI SUSCEPTIBILITIES TO FOLLOW Performed at [facility], [address] [place], [place] [address]    Report Status PENDING  Incomplete  Blood Culture ID Panel (Reflexed)     Status: Abnormal   Collection Time: 02/10/17  1:51 PM  Result Value Ref Range Status   Enterococcus species NOT DETECTED NOT DETECTED Final   Listeria monocytogenes NOT DETECTED NOT DETECTED Final   Staphylococcus species NOT DETECTED NOT DETECTED Final   Staphylococcus aureus NOT DETECTED NOT DETECTED Final   Streptococcus species NOT DETECTED NOT DETECTED Final   Streptococcus agalactiae NOT DETECTED NOT DETECTED Final   Streptococcus pneumoniae NOT DETECTED NOT DETECTED Final   Streptococcus pyogenes NOT DETECTED NOT DETECTED Final   Acinetobacter baumannii NOT DETECTED NOT DETECTED Final   Enterobacteriaceae species DETECTED (A) NOT DETECTED Final    Comment: Enterobacteriaceae represent a large family of gram negative bacteria, not a single organism. Refer to culture for further identification. CRITICAL RESULT CALLED TO, READ BACK BY AND VERIFIED WITH: DAVID BESANTI AT 0437 ON 02/11/17 [facility].    Enterobacter cloacae complex NOT DETECTED NOT DETECTED Final   Escherichia coli NOT DETECTED NOT DETECTED Final   Klebsiella oxytoca NOT DETECTED NOT DETECTED Final   Klebsiella pneumoniae NOT DETECTED NOT DETECTED Final   Proteus  species NOT DETECTED NOT DETECTED  Final   Serratia marcescens NOT DETECTED NOT DETECTED Final   Carbapenem resistance NOT DETECTED NOT DETECTED Final   Haemophilus influenzae NOT DETECTED NOT DETECTED Final   Neisseria meningitidis NOT DETECTED NOT DETECTED Final   Pseudomonas aeruginosa NOT DETECTED NOT DETECTED Final   Candida albicans NOT DETECTED NOT DETECTED Final   Candida glabrata NOT DETECTED NOT DETECTED Final   Candida krusei NOT DETECTED NOT DETECTED Final   Candida parapsilosis NOT DETECTED NOT DETECTED Final   Candida tropicalis NOT DETECTED NOT DETECTED Final    Comment: Performed at [facility], [address] [place], [place] [address]  Blood Culture (routine x 2)     Status: Abnormal (Preliminary result)   Collection Time: 02/10/17  1:56 PM  Result Value Ref Range Status   Specimen Description   Final    BLOOD LEFT UPPER ARM Performed at [facility], [address], [place], [place] [address]    Special Requests   Final    BOTTLES DRAWN AEROBIC AND ANAEROBIC Blood Culture adequate volume Performed at [facility], [address] [place], [place] [address]    Culture  Setup Time   Final    GRAM NEGATIVE RODS IN BOTH AEROBIC AND ANAEROBIC BOTTLES CRITICAL VALUE NOTED.  VALUE IS CONSISTENT WITH PREVIOUSLY REPORTED AND CALLED VALUE.    Culture (A)  Final    CITROBACTER KOSERI SUSCEPTIBILITIES TO FOLLOW Performed at [facility], [address], [place], [place] [address]    Report Status PENDING  Incomplete  Urine culture     Status: Abnormal (Preliminary result)   Collection Time: 02/10/17  2:24 PM  Result Value Ref Range Status   Specimen Description   Final    URINE, RANDOM Performed at [facility], [address] [place], [place] [address]    Special Requests   Final    NONE Performed at [facility], [address] [place], [place] [address]    Culture (A)  Final    >=100,000 COLONIES/mL  CITROBACTER KOSERI SUSCEPTIBILITIES TO FOLLOW Performed at [facility], [address], [place], [place] [address]    Report Status PENDING  Incomplete    Radiology Reports Ct Head Wo Contrast  Result Date: 02/10/2017 CLINICAL DATA:  22 year old with seizure.  Initial encounter. EXAM: CT HEAD WITHOUT CONTRAST TECHNIQUE: Contiguous axial images were obtained from the base of the skull through the vertex without intravenous contrast. COMPARISON:  09/13/2015, 12/27/2012 and 12/24/2011. FINDINGS: Brain: No intracranial hemorrhage or CT evidence of large acute infarct. 3 x 1.6 x 1.7 cm cystic structure medial to the right hippocampus (deformity hippocampus) unchanged from prior exams. Global atrophy. Falx calcifications Vascular: New negative Skull: Negative Sinuses/Orbits: Visualized orbital structures acute abnormality noted. Minimal mucosal thickening ethmoid sinus air cells and maxillary sinuses. Other: Mastoid air cells and middle ear cavities are clear. IMPRESSION: No intracranial hemorrhage or CT evidence of large acute infarct. Stable 3 x 1.6 x 1.7 cm cystic structure medial to the right hippocampus (deformity hippocampus). If further delineation is clinically desired, MR can be obtained. Global atrophy. Electronically Signed   By: Lacy Duverney M.D.   On: 02/10/2017 14:25   Dg Chest Port 1 View  Result Date: 02/10/2017 CLINICAL DATA:  Seizures. EXAM: PORTABLE CHEST 1 VIEW COMPARISON:  Single-view of the chest 09/14/2015. FINDINGS: Lung volumes are low. A small focus of airspace opacity is seen in the right base. The left lung is clear. No pneumothorax or pleural  effusion. Heart size is normal. Stimulator device with lead extending into the neck is unchanged. IMPRESSION: Small focus of hazy airspace in the right base in this low volume chest is likely atelectasis but could be due to aspiration or pneumonia. The exam is otherwise negative. Electronically Signed   By: Drusilla Kanner M.D.    On: 02/10/2017 14:30     CBC Recent Labs  Lab 02/10/17 1351 02/11/17 0515 02/12/17 0454  WBC 10.9* 9.2 7.8  HGB 10.9* 9.7* 9.2*  HCT 31.3* 28.2* 26.1*  PLT 97* 62* 64*  MCV 88.1 88.0 87.4  MCH 30.6 30.2 30.9  MCHC 34.7 34.3 35.3  RDW 14.1 14.4 13.8  LYMPHSABS 0.3*  --   --   MONOABS 1.9*  --   --   EOSABS 0.0  --   --   BASOSABS 0.0  --   --     Chemistries  Recent Labs  Lab 02/10/17 1351 02/11/17 0515 02/12/17 0454  NA 126* 129* 133*  K 4.0 3.9 3.6  CL 92* 97* 101  CO2 22 21* 22  GLUCOSE 98 85 80  BUN 22* 21* 20  CREATININE 0.80 0.68 0.55*  CALCIUM 9.1 8.7* 8.6*  AST 17  --   --   ALT 7*  --   --   ALKPHOS 53  --   --   BILITOT 0.5  --   --    ------------------------------------------------------------------------------------------------------------------ estimated creatinine clearance is 131.8 mL/min (A) (by C-G formula based on SCr of 0.55 mg/dL (L)). ------------------------------------------------------------------------------------------------------------------ No results for input(s): HGBA1C in the last 72 hours. ------------------------------------------------------------------------------------------------------------------ No results for input(s): CHOL, HDL, LDLCALC, TRIG, CHOLHDL, LDLDIRECT in the last 72 hours. ------------------------------------------------------------------------------------------------------------------ No results for input(s): TSH, T4TOTAL, T3FREE, THYROIDAB in the last 72 hours.  Invalid input(s): FREET3 ------------------------------------------------------------------------------------------------------------------ No results for input(s): VITAMINB12, FOLATE, FERRITIN, TIBC, IRON, RETICCTPCT in the last 72 hours.  Coagulation profile Recent Labs  Lab 02/10/17 1832  INR 1.03    No results for input(s): DDIMER in the last 72 hours.  Cardiac Enzymes No results for input(s): CKMB, TROPONINI, MYOGLOBIN in the last 168  hours.  Invalid input(s): CK ------------------------------------------------------------------------------------------------------------------ Invalid input(s): POCBNP    Assessment & Plan  Patient is a 22 year old with history of autism being admitted with sepsis  #1Sepsis due to aspiration pneumonia.  As well as UTI Continue IV Zosyn and vancomycin #2 UTI with citrobac with positive blood cultures Continue IV Zosyn and vancomycin #3Seizure activity.  Appreciate neurology continue home regimen #4 chronic hyponatremia.  Continue IV fluids. #5 chronic thrombocytopenia.    Further drop we will discontinue Lovenox         Code Status Orders  (From admission, onward)        Start     Ordered   02/10/17 1653  Full code  Continuous     02/10/17 1652    Code Status History    Date Active Date Inactive Code Status Order ID Comments User Context   09/13/2015 06:23 09/14/2015 19:33 Full Code [id number]  Arnaldo Natal, MD Inpatient           Consults none  DVT Prophylaxis SCDs  Lab Results  Component Value Date   PLT 64 (L) 02/12/2017     Time Spent in minutes greater than 50% of time spent in care coordination and counseling patient regarding the condition and plan of care.   Auburn Bilberry M.D on 02/12/2017 at 4:06 PM  Between 7am to 6pm - Pager -  [phone]  After 6pm go to [email or web address] - password [email or web address] [facility]   Office  [phone]

## 2017-02-13 LAB — CBC
HCT: 26.1 % — ABNORMAL LOW (ref 40.0–52.0)
Hemoglobin: 8.9 g/dL — ABNORMAL LOW (ref 13.0–18.0)
MCH: 30.1 pg (ref 26.0–34.0)
MCHC: 34.1 g/dL (ref 32.0–36.0)
MCV: 88.4 fL (ref 80.0–100.0)
Platelets: 76 10*3/uL — ABNORMAL LOW (ref 150–440)
RBC: 2.95 MIL/uL — ABNORMAL LOW (ref 4.40–5.90)
RDW: 13.7 % (ref 11.5–14.5)
WBC: 5.6 10*3/uL (ref 3.8–10.6)

## 2017-02-13 LAB — BASIC METABOLIC PANEL
Anion gap: 13 (ref 5–15)
BUN: 18 mg/dL (ref 6–20)
CO2: 20 mmol/L — ABNORMAL LOW (ref 22–32)
Calcium: 8.8 mg/dL — ABNORMAL LOW (ref 8.9–10.3)
Chloride: 104 mmol/L (ref 101–111)
Creatinine, Ser: 0.52 mg/dL — ABNORMAL LOW (ref 0.61–1.24)
GFR calc Af Amer: >60 mL/min (ref >60)
GFR calc non Af Amer: >60 mL/min (ref >60)
Glucose, Bld: 82 mg/dL (ref 65–99)
Potassium: 3.5 mmol/L (ref 3.5–5.1)
Sodium: 137 mmol/L (ref 135–145)

## 2017-02-13 LAB — CULTURE, BLOOD (ROUTINE X 2)
Special Requests: ADEQUATE
Special Requests: ADEQUATE

## 2017-02-13 LAB — URINE CULTURE: Culture: >=100000 — AB

## 2017-02-13 NOTE — Progress Notes (Addendum)
[employer] [facility] - [place] [facility]                                                                                                                                                                                  Patient Demographics   Noah Marshall, is a 22 y.o. male, DOB - 03-26-1995, XLK:[id number]  Admit date - 02/10/2017   Admitting Physician Shaune Pollack, MD  Outpatient Primary MD for the patient is Fisher, Demetrios Isaacs, MD   LOS - 3  Subjective: Patient with autism unable to provide any review of systems Patient is noncommunicative, smiling. Review of Systems:   CONSTITUTIONAL: Able to provide due to his chronic autism Vitals:   Vitals:   02/12/17 1212 02/12/17 2005 02/13/17 0503 02/13/17 1430  BP: (!) 103/58 123/71 (!) 111/51 119/63  Pulse: 94 87 87 93  Resp: 20 18 20 18   Temp: 98.2 F (36.8 C) 98.4 F (36.9 C) 98.1 F (36.7 C) 98 F (36.7 C)  TempSrc: Axillary Oral  Axillary  SpO2: 95% 94% 100% 98%  Weight:      Height:        Wt Readings from Last 3 Encounters:  02/10/17 145 lb (65.8 kg)  09/26/15 174 lb (78.9 kg)  09/14/15 183 lb 1.6 oz (83.1 kg)     Intake/Output Summary (Last 24 hours) at 02/13/2017 1737 Last data filed at 02/13/2017 1210 Gross per 24 hour  Intake 1765 ml  Output -  Net 1765 ml    Physical Exam:   GENERAL: Pleasant-appearing in no apparent distress.  HEAD, EYES, EARS, NOSE AND THROAT: Atraumatic, normocephalic. . Pupils equal and reactive to light. Sclerae anicteric. No conjunctival injection. No oro-pharyngeal erythema.  NECK: Supple. There is no jugular venous distention. No bruits, no lymphadenopathy, no thyromegaly.  HEART: Rhonchus breath sounds LUNGS: Clear to auscultation bilaterally. No rales or rhonchi. No wheezes.  ABDOMEN: Soft, flat, nontender, nondistended. Has good bowel sounds. No hepatosplenomegaly appreciated.  EXTREMITIES: No evidence of any cyanosis, clubbing, or peripheral edema.  +2 pedal and radial  pulses bilaterally.  NEUROLOGIC: The patient is awake but noncommunicative, not follow commands. SKIN: Moist and warm with no rashes appreciated.  Psych: Noncommunicative. LN: No inguinal LN enlargement    Antibiotics   Anti-infectives (From admission, onward)   Start     Dose/Rate Route Frequency Ordered Stop   02/10/17 2230  vancomycin (VANCOCIN) IVPB 1000 mg/200 mL premix  Status:  Discontinued     1,000 mg 200 mL/hr over 60 Minutes Intravenous Every 8 hours 02/10/17 1513 02/11/17 0626   02/10/17 2030  piperacillin-tazobactam (ZOSYN) IVPB 3.375 g     3.375 g 12.5 mL/hr over  240 Minutes Intravenous Every 8 hours 02/10/17 1513     02/10/17 1400  piperacillin-tazobactam (ZOSYN) IVPB 3.375 g     3.375 g 100 mL/hr over 30 Minutes Intravenous  Once 02/10/17 1352 02/10/17 1519   02/10/17 1400  vancomycin (VANCOCIN) IVPB 1000 mg/200 mL premix     1,000 mg 200 mL/hr over 60 Minutes Intravenous  Once 02/10/17 1352 02/10/17 1558      Medications   Scheduled Meds: . cloBAZam  15 mg Oral BID  . clonazePAM  0.5 mg Oral TID  . fluticasone  2 spray Each Nare Daily  . levOCARNitine  500 mg Oral BID  . montelukast  10 mg Oral QHS  . OXcarbazepine  600 mg Oral BID  . topiramate  200 mg Per Tube BID  . Valproate Sodium  500 mg Oral BID  . Vitamin D (Ergocalciferol)  50,000 Units Oral Q7 days   Continuous Infusions: . piperacillin-tazobactam (ZOSYN)  IV 3.375 g (02/13/17 1500)   PRN Meds:.acetaminophen **OR** acetaminophen, albuterol, bisacodyl, guaiFENesin, HYDROcodone-acetaminophen, LORazepam, ondansetron **OR** ondansetron (ZOFRAN) IV, senna-docusate   Data Review:   Micro Results Recent Results (from the past 240 hour(s))  Blood Culture (routine x 2)     Status: Abnormal   Collection Time: 02/10/17  1:51 PM  Result Value Ref Range Status   Specimen Description   Final    BLOOD LEFT EJ Performed at [facility], [address], [place], [place] [address]    Special  Requests   Final    BOTTLES DRAWN AEROBIC AND ANAEROBIC Blood Culture adequate volume Performed at [facility], [address], [place], [place] [address]    Culture  Setup Time   Final    GRAM NEGATIVE RODS IN BOTH AEROBIC AND ANAEROBIC BOTTLES CRITICAL RESULT CALLED TO, READ BACK BY AND VERIFIED WITH: DAVID BESANTI AT 0437 ON 02/11/17 [facility].    Culture (A)  Final    CITROBACTER KOSERI SUSCEPTIBILITIES PERFORMED ON PREVIOUS CULTURE WITHIN THE LAST 5 DAYS. Performed at [facility], [address], [place], [place] [address]    Report Status 02/13/2017 FINAL  Final  Blood Culture ID Panel (Reflexed)     Status: Abnormal   Collection Time: 02/10/17  1:51 PM  Result Value Ref Range Status   Enterococcus species NOT DETECTED NOT DETECTED Final   Listeria monocytogenes NOT DETECTED NOT DETECTED Final   Staphylococcus species NOT DETECTED NOT DETECTED Final   Staphylococcus aureus NOT DETECTED NOT DETECTED Final   Streptococcus species NOT DETECTED NOT DETECTED Final   Streptococcus agalactiae NOT DETECTED NOT DETECTED Final   Streptococcus pneumoniae NOT DETECTED NOT DETECTED Final   Streptococcus pyogenes NOT DETECTED NOT DETECTED Final   Acinetobacter baumannii NOT DETECTED NOT DETECTED Final   Enterobacteriaceae species DETECTED (A) NOT DETECTED Final    Comment: Enterobacteriaceae represent a large family of gram negative bacteria, not a single organism. Refer to culture for further identification. CRITICAL RESULT CALLED TO, READ BACK BY AND VERIFIED WITH: DAVID BESANTI AT 0437 ON 02/11/17 [facility].    Enterobacter cloacae complex NOT DETECTED NOT DETECTED Final   Escherichia coli NOT DETECTED NOT DETECTED Final   Klebsiella oxytoca NOT DETECTED NOT DETECTED Final   Klebsiella pneumoniae NOT DETECTED NOT DETECTED Final   Proteus species NOT DETECTED NOT DETECTED Final   Serratia marcescens NOT DETECTED NOT DETECTED Final   Carbapenem resistance NOT DETECTED NOT DETECTED  Final   Haemophilus influenzae NOT DETECTED NOT DETECTED Final  Neisseria meningitidis NOT DETECTED NOT DETECTED Final   Pseudomonas aeruginosa NOT DETECTED NOT DETECTED Final   Candida albicans NOT DETECTED NOT DETECTED Final   Candida glabrata NOT DETECTED NOT DETECTED Final   Candida krusei NOT DETECTED NOT DETECTED Final   Candida parapsilosis NOT DETECTED NOT DETECTED Final   Candida tropicalis NOT DETECTED NOT DETECTED Final    Comment: Performed at [facility], [address] [place], [place] [address]  Blood Culture (routine x 2)     Status: Abnormal   Collection Time: 02/10/17  1:56 PM  Result Value Ref Range Status   Specimen Description   Final    BLOOD LEFT UPPER ARM Performed at [facility], [address] [place], [place] [address]    Special Requests   Final    BOTTLES DRAWN AEROBIC AND ANAEROBIC Blood Culture adequate volume Performed at [facility], [address] [place], [place] [address]    Culture  Setup Time   Final    GRAM NEGATIVE RODS IN BOTH AEROBIC AND ANAEROBIC BOTTLES CRITICAL VALUE NOTED.  VALUE IS CONSISTENT WITH PREVIOUSLY REPORTED AND CALLED VALUE.    Culture CITROBACTER KOSERI (A)  Final   Report Status 02/13/2017 FINAL  Final   Organism ID, Bacteria CITROBACTER KOSERI  Final      Susceptibility   Citrobacter koseri - MIC*    CEFAZOLIN <=4 SENSITIVE Sensitive     CEFEPIME <=1 SENSITIVE Sensitive     CEFTAZIDIME <=1 SENSITIVE Sensitive     CEFTRIAXONE <=1 SENSITIVE Sensitive     CIPROFLOXACIN <=0.25 SENSITIVE Sensitive     GENTAMICIN <=1 SENSITIVE Sensitive     IMIPENEM <=0.25 SENSITIVE Sensitive     TRIMETH/SULFA <=20 SENSITIVE Sensitive     PIP/TAZO <=4 SENSITIVE Sensitive     * CITROBACTER KOSERI  Urine culture     Status: Abnormal   Collection Time: 02/10/17  2:24 PM  Result Value Ref Range Status   Specimen Description   Final    URINE, RANDOM Performed at [facility], [address], [place], [place] [address]    Special Requests   Final    NONE Performed at [facility], [address] [place], [place] [address]    Culture >=100,000 COLONIES/mL CITROBACTER KOSERI (A)  Final   Report Status 02/13/2017 FINAL  Final   Organism ID, Bacteria CITROBACTER KOSERI (A)  Final      Susceptibility   Citrobacter koseri - MIC*    CEFAZOLIN <=4 SENSITIVE Sensitive     CEFTRIAXONE <=1 SENSITIVE Sensitive     CIPROFLOXACIN <=0.25 SENSITIVE Sensitive     GENTAMICIN <=1 SENSITIVE Sensitive     IMIPENEM <=0.25 SENSITIVE Sensitive     NITROFURANTOIN 32 SENSITIVE Sensitive     TRIMETH/SULFA <=20 SENSITIVE Sensitive     PIP/TAZO <=4 SENSITIVE Sensitive     * >=100,000 COLONIES/mL CITROBACTER KOSERI    Radiology Reports Ct Head Wo Contrast  Result Date: 02/10/2017 CLINICAL DATA:  22 year old with seizure.  Initial encounter. EXAM: CT HEAD WITHOUT CONTRAST TECHNIQUE: Contiguous axial images were obtained from the base of the skull through the vertex without intravenous contrast. COMPARISON:  09/13/2015, 12/27/2012 and 12/24/2011. FINDINGS: Brain: No intracranial hemorrhage or CT evidence of large acute infarct. 3 x 1.6 x 1.7 cm cystic structure medial to the right hippocampus (deformity hippocampus) unchanged from prior exams. Global atrophy. Falx calcifications Vascular: New negative Skull: Negative Sinuses/Orbits: Visualized orbital structures acute abnormality noted. Minimal mucosal thickening ethmoid sinus  air cells and maxillary sinuses. Other: Mastoid air cells and middle ear cavities are clear. IMPRESSION: No intracranial hemorrhage or CT evidence of large acute infarct. Stable 3 x 1.6 x 1.7 cm cystic structure medial to the right hippocampus (deformity hippocampus). If further delineation is clinically desired, MR can be obtained. Global atrophy. Electronically Signed   By: Lacy DuverneySteven  Olson M.D.   On: 02/10/2017 14:25   Dg Chest Port 1 View  Result Date: 02/10/2017 CLINICAL  DATA:  Seizures. EXAM: PORTABLE CHEST 1 VIEW COMPARISON:  Single-view of the chest 09/14/2015. FINDINGS: Lung volumes are low. A small focus of airspace opacity is seen in the right base. The left lung is clear. No pneumothorax or pleural effusion. Heart size is normal. Stimulator device with lead extending into the neck is unchanged. IMPRESSION: Small focus of hazy airspace in the right base in this low volume chest is likely atelectasis but could be due to aspiration or pneumonia. The exam is otherwise negative. Electronically Signed   By: Drusilla Kannerhomas  Dalessio M.D.   On: 02/10/2017 14:30     CBC Recent Labs  Lab 02/10/17 1351 02/11/17 0515 02/12/17 0454 02/13/17 0608  WBC 10.9* 9.2 7.8 5.6  HGB 10.9* 9.7* 9.2* 8.9*  HCT 31.3* 28.2* 26.1* 26.1*  PLT 97* 62* 64* 76*  MCV 88.1 88.0 87.4 88.4  MCH 30.6 30.2 30.9 30.1  MCHC 34.7 34.3 35.3 34.1  RDW 14.1 14.4 13.8 13.7  LYMPHSABS 0.3*  --   --   --   MONOABS 1.9*  --   --   --   EOSABS 0.0  --   --   --   BASOSABS 0.0  --   --   --     Chemistries  Recent Labs  Lab 02/10/17 1351 02/11/17 0515 02/12/17 0454 02/13/17 0608  NA 126* 129* 133* 137  K 4.0 3.9 3.6 3.5  CL 92* 97* 101 104  CO2 22 21* 22 20*  GLUCOSE 98 85 80 82  BUN 22* 21* 20 18  CREATININE 0.80 0.68 0.55* 0.52*  CALCIUM 9.1 8.7* 8.6* 8.8*  AST 17  --   --   --   ALT 7*  --   --   --   ALKPHOS 53  --   --   --   BILITOT 0.5  --   --   --    ------------------------------------------------------------------------------------------------------------------ estimated creatinine clearance is 131.8 mL/min (A) (by C-G formula based on SCr of 0.52 mg/dL (L)). ------------------------------------------------------------------------------------------------------------------ No results for input(s): HGBA1C in the last 72 hours. ------------------------------------------------------------------------------------------------------------------ No results for input(s): CHOL, HDL,  LDLCALC, TRIG, CHOLHDL, LDLDIRECT in the last 72 hours. ------------------------------------------------------------------------------------------------------------------ No results for input(s): TSH, T4TOTAL, T3FREE, THYROIDAB in the last 72 hours.  Invalid input(s): FREET3 ------------------------------------------------------------------------------------------------------------------ No results for input(s): VITAMINB12, FOLATE, FERRITIN, TIBC, IRON, RETICCTPCT in the last 72 hours.  Coagulation profile Recent Labs  Lab 02/10/17 1832  INR 1.03    No results for input(s): DDIMER in the last 72 hours.  Cardiac Enzymes No results for input(s): CKMB, TROPONINI, MYOGLOBIN in the last 168 hours.  Invalid input(s): CK ------------------------------------------------------------------------------------------------------------------ Invalid input(s): POCBNP    Assessment & Plan  Patient is a 22 year old with history of autism being admitted with sepsis  #1Sepsis due to aspiration pneumonia and UTI, bacteremia. Continue IV Zosyn and discontinue vancomycin, follow-up Dr. Sampson GoonFitzgerald to change antibiotics.  #2 UTI with citrobac with positive blood cultures Continue IV Zosyn and discontinue vancomycin.  May change to p.o. antibiotics, follow-up  Dr. Sampson Goon.  #3 Seizure activity.  Continue Onfi, trileptal, levocarnitine, depakote, clonipin, topamax per neurology consult.  #4 chronic hyponatremia.  Improved. #5 chronic thrombocytopenia.  discontinued Lovenox  Anemia of chronic disease.  Stable.     Code Status Orders  (From admission, onward)        Start     Ordered   02/10/17 1653  Full code  Continuous     02/10/17 1652    Code Status History    Date Active Date Inactive Code Status Order ID Comments User Context   09/13/2015 06:23 09/14/2015 19:33 Full Code [id number]  Arnaldo Natal, MD Inpatient           Consults ID  DVT Prophylaxis SCDs  Lab Results   Component Value Date   PLT 76 (L) 02/13/2017    Discussed with the patient's mother, RN and case Production designer, theatre/television/film. Time Spent in minutes 32 minutes greater than 50% of time spent in care coordination and counseling patient regarding the condition and plan of care. Possible discharge to home with 1-2 days.  Shaune Pollack M.D on 02/13/2017 at 5:37 PM  Between 7am to 6pm - Pager - [phone]  After 6pm go to [email or web address] - password [email or web address] [facility]  [facility]   Office  [phone]

## 2017-02-13 NOTE — Consult Note (Signed)
[facility] Infectious Disease     Reason for Consult: PNA, bacteremia, UTI    Referring Physician: Estanislado Spire Date of Admission:  02/10/2017   Active Problems:   Sepsis ([place])   HPI: Noah Marshall is a 22 y.o. male history of seizure disorder, aspiration pneumonia, autism and respiratory failure.  The patient is nonverbal and noncommunicative.  According to her mother, the patient had a severe episode of seizure several days ago and become less responsive than usual.  In the ED, he is found fever 102.7, tachycardia at 120s, tachypnea and chest x-ray show right base aspiration pneumonia. BCx and UCX + citrobacter Per his mother and father he is now at baseline.    Past Medical History:  Diagnosis Date  . Aspiration pneumonia ([place])   . Autism   . Epilepsy ([place])   . 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 Intracranial arachnoid cysts   . Pes planovalgus   . Premature ventricular contractions   . Respiratory failure [facility])    Past Surgical History:  Procedure Laterality Date  . FOREIGN BODY REMOVAL N/A 03/08/2015   Procedure: FOREIGN BODY REMOVAL;  Surgeon: Josefine Class, MD;  Location: [facility] ENDOSCOPY;  Service: Endoscopy;  Laterality: N/A;  . IMPLANTATION VAGAL NERVE STIMULATOR  2011  . REPLACE / REVISE VAGAL NERVE STIMULATOR     Social History   Tobacco Use  . Smoking status: Never Smoker  . Smokeless tobacco: Never Used  Substance Use Topics  . Alcohol use: No    Frequency: Never  . Drug use: No   Family History  Problem Relation Age of Onset  . Diabetes Mellitus II Father   . Hypertension Father     Allergies:  Allergies  Allergen Reactions  . Lactose Intolerance (Gi) Diarrhea  . Peanut-Containing Drug Products Hives    Current antibiotics: Antibiotics Given (last 72 hours)    Date/Time Action Medication Dose Rate   02/10/17 2144 New Bag/Given   piperacillin-tazobactam (ZOSYN) IVPB 3.375 g 3.375 g 12.5 mL/hr   02/10/17 2151 New Bag/Given   vancomycin (VANCOCIN) IVPB 1000 mg/200 mL premix 1,000 mg 200 mL/hr   02/11/17 0536 New Bag/Given   vancomycin (VANCOCIN) IVPB 1000 mg/200 mL premix 1,000 mg 200 mL/hr   02/11/17 0536 New Bag/Given   piperacillin-tazobactam (ZOSYN) IVPB 3.375 g 3.375 g 12.5 mL/hr   02/11/17 1451 New Bag/Given   piperacillin-tazobactam (ZOSYN) IVPB 3.375 g 3.375 g 12.5 mL/hr   02/11/17 2203 New Bag/Given   piperacillin-tazobactam (ZOSYN) IVPB 3.375 g 3.375 g 12.5 mL/hr   02/12/17 0520 New Bag/Given   piperacillin-tazobactam (ZOSYN) IVPB 3.375 g 3.375 g 12.5 mL/hr   02/12/17 1320 New Bag/Given   piperacillin-tazobactam (ZOSYN) IVPB 3.375 g 3.375 g 12.5 mL/hr   02/12/17 2255 New Bag/Given   piperacillin-tazobactam (ZOSYN) IVPB 3.375 g 3.375 g 12.5 mL/hr   02/13/17 0500 New Bag/Given   piperacillin-tazobactam (ZOSYN) IVPB 3.375 g 3.375 g 12.5 mL/hr   02/13/17 1500 New Bag/Given   piperacillin-tazobactam (ZOSYN) IVPB 3.375 g 3.375 g 12.5 mL/hr      MEDICATIONS: . cloBAZam  15 mg Oral BID  . clonazePAM  0.5 mg Oral TID  . fluticasone  2 spray Each Nare Daily  . levOCARNitine  500 mg Oral BID  . montelukast  10 mg Oral QHS  . OXcarbazepine  600 mg Oral BID  . topiramate  200 mg Per Tube BID  . Valproate Sodium  500 mg Oral BID  . Vitamin D (Ergocalciferol)  50,000 Units Oral Q7 days    Review of Systems - unable to obtain  OBJECTIVE: Temp:  [98 F (36.7 C)-98.4 F (36.9 C)] 98 F (36.7 C) (01/17 1430) Pulse Rate:  [87-93] 93 (01/17 1430) Resp:  [18-20] 18 (01/17 1430) BP: (111-123)/(51-71) 119/63 (01/17 1430) SpO2:  [94 %-100 %] 98 % (01/17 1430) Physical Exam  Constitutional: He is awake but non verbal, no distress HENT: anicteric Mouth/Throat: Oropharynx is clear and moist. No oropharyngeal exudate.  Cardiovascular: Normal rate, regular rhythm and normal heart sounds. Pulmonary/Chest: Effort normal and breath sounds normal. No respiratory distress. He has no wheezes.  Abdominal: Soft.  Bowel sounds are normal. He exhibits no distension. There is no tenderness.  Lymphadenopathy: He has no cervical adenopathy.  Neurological: He is awake but non verbal Skin: Skin is warm and dry. No rash noted. No erythema.  Psychiatric: He has a normal mood and affect. His behavior is normal.    LABS: Results for orders placed or performed during the hospital encounter of 02/10/17 (from the past 48 hour(s))  CBC     Status: Abnormal   Collection Time: 02/12/17  4:54 AM  Result Value Ref Range   WBC 7.8 3.8 - 10.6 K/uL   RBC 2.98 (L) 4.40 - 5.90 MIL/uL   Hemoglobin 9.2 (L) 13.0 - 18.0 g/dL   HCT 26.1 (L) 40.0 - 52.0 %   MCV 87.4 80.0 - 100.0 fL   MCH 30.9 26.0 - 34.0 pg   MCHC 35.3 32.0 - 36.0 g/dL   RDW 13.8 11.5 - 14.5 %   Platelets 64 (L) 150 - 440 K/uL    Comment: Performed at [facility], [address] [place], [place] [address]  Basic metabolic panel     Status: Abnormal   Collection Time: 02/12/17  4:54 AM  Result Value Ref Range   Sodium 133 (L) 135 - 145 mmol/L   Potassium 3.6 3.5 - 5.1 mmol/L   Chloride 101 101 - 111 mmol/L   CO2 22 22 - 32 mmol/L   Glucose, Bld 80 65 - 99 mg/dL   BUN 20 6 - 20 mg/dL   Creatinine, Ser 0.55 (L) 0.61 - 1.24 mg/dL   Calcium 8.6 (L) 8.9 - 10.3 mg/dL   GFR calc non Af Amer >60 >60 mL/min   GFR calc Af Amer >60 >60 mL/min    Comment: (NOTE) The eGFR has been calculated using the CKD EPI equation. This calculation has not been validated in all clinical situations. eGFR's persistently <60 mL/min signify possible Chronic Kidney Disease.    Anion gap 10 5 - 15    Comment: Performed at [facility], [address] [place], [place] [address]  CBC     Status: Abnormal   Collection Time: 02/13/17  6:08 AM  Result Value Ref Range   WBC 5.6 3.8 - 10.6 K/uL   RBC 2.95 (L) 4.40 - 5.90 MIL/uL   Hemoglobin 8.9 (L) 13.0 - 18.0 g/dL   HCT 26.1 (L) 40.0 - 52.0 %   MCV 88.4 80.0 - 100.0 fL   MCH 30.1 26.0 - 34.0 pg   MCHC  34.1 32.0 - 36.0 g/dL   RDW 13.7 11.5 - 14.5 %   Platelets 76 (L) 150 - 440 K/uL    Comment: Performed at [facility], [address], [place], [place] [address]  Basic metabolic panel     Status: Abnormal   Collection Time: 02/13/17  6:08 AM  Result Value  Ref Range   Sodium 137 135 - 145 mmol/L   Potassium 3.5 3.5 - 5.1 mmol/L   Chloride 104 101 - 111 mmol/L   CO2 20 (L) 22 - 32 mmol/L   Glucose, Bld 82 65 - 99 mg/dL   BUN 18 6 - 20 mg/dL   Creatinine, Ser 0.52 (L) 0.61 - 1.24 mg/dL   Calcium 8.8 (L) 8.9 - 10.3 mg/dL   GFR calc non Af Amer >60 >60 mL/min   GFR calc Af Amer >60 >60 mL/min    Comment: (NOTE) The eGFR has been calculated using the CKD EPI equation. This calculation has not been validated in all clinical situations. eGFR's persistently <60 mL/min signify possible Chronic Kidney Disease.    Anion gap 13 5 - 15    Comment: Performed at [facility], [address] [place], [place] [address]   No components found for: ESR, C REACTIVE PROTEIN MICRO: Recent Results (from the past 720 hour(s))  Blood Culture (routine x 2)     Status: Abnormal   Collection Time: 02/10/17  1:51 PM  Result Value Ref Range Status   Specimen Description   Final    BLOOD LEFT EJ Performed at [facility], [address] [place], [place] [address]    Special Requests   Final    BOTTLES DRAWN AEROBIC AND ANAEROBIC Blood Culture adequate volume Performed at [facility], [address], [place], [place] [address]    Culture  Setup Time   Final    GRAM NEGATIVE RODS IN BOTH AEROBIC AND ANAEROBIC BOTTLES CRITICAL RESULT CALLED TO, READ BACK BY AND VERIFIED WITH: Noah Marshall AT [place] ON 02/11/17 [place].    Culture (A)  Final    CITROBACTER KOSERI SUSCEPTIBILITIES PERFORMED ON PREVIOUS CULTURE WITHIN THE LAST 5 DAYS. Performed at [facility], [place] [address], [place], [place] [address]    Report Status 02/13/2017 FINAL  Final  Blood Culture  ID Panel (Reflexed)     Status: Abnormal   Collection Time: 02/10/17  1:51 PM  Result Value Ref Range Status   Enterococcus species NOT DETECTED NOT DETECTED Final   Listeria monocytogenes NOT DETECTED NOT DETECTED Final   Staphylococcus species NOT DETECTED NOT DETECTED Final   Staphylococcus aureus NOT DETECTED NOT DETECTED Final   Streptococcus species NOT DETECTED NOT DETECTED Final   Streptococcus agalactiae NOT DETECTED NOT DETECTED Final   Streptococcus pneumoniae NOT DETECTED NOT DETECTED Final   Streptococcus pyogenes NOT DETECTED NOT DETECTED Final   Acinetobacter baumannii NOT DETECTED NOT DETECTED Final   Enterobacteriaceae species DETECTED (A) NOT DETECTED Final    Comment: Enterobacteriaceae represent a large family of gram negative bacteria, not a single organism. Refer to culture for further identification. CRITICAL RESULT CALLED TO, READ BACK BY AND VERIFIED WITH: Noah Marshall AT [phone] ON 02/11/17 [place].    Enterobacter cloacae complex NOT DETECTED NOT DETECTED Final   Escherichia coli NOT DETECTED NOT DETECTED Final   Klebsiella oxytoca NOT DETECTED NOT DETECTED Final   Klebsiella pneumoniae NOT DETECTED NOT DETECTED Final   Proteus species NOT DETECTED NOT DETECTED Final   Serratia marcescens NOT DETECTED NOT DETECTED Final   Carbapenem resistance NOT DETECTED NOT DETECTED Final   Haemophilus influenzae NOT DETECTED NOT DETECTED Final   Neisseria meningitidis NOT DETECTED NOT DETECTED Final   Pseudomonas aeruginosa NOT DETECTED NOT DETECTED Final   Candida albicans NOT DETECTED NOT DETECTED Final   Candida glabrata NOT DETECTED NOT DETECTED Final  Candida krusei NOT DETECTED NOT DETECTED Final   Candida parapsilosis NOT DETECTED NOT DETECTED Final   Candida tropicalis NOT DETECTED NOT DETECTED Final    Comment: Performed at [facility], [address] [place], [place] [address]  Blood Culture (routine x 2)     Status: Abnormal   Collection Time: 02/10/17   1:56 PM  Result Value Ref Range Status   Specimen Description   Final    BLOOD LEFT UPPER ARM Performed at [facility], [address], [place], [place] [address]    Special Requests   Final    BOTTLES DRAWN AEROBIC AND ANAEROBIC Blood Culture adequate volume Performed at [facility], [address] [place], [place] [address]    Culture  Setup Time   Final    GRAM NEGATIVE RODS IN BOTH AEROBIC AND ANAEROBIC BOTTLES CRITICAL VALUE NOTED.  VALUE IS CONSISTENT WITH PREVIOUSLY REPORTED AND CALLED VALUE.    Culture CITROBACTER KOSERI (A)  Final   Report Status 02/13/2017 FINAL  Final   Organism ID, Bacteria CITROBACTER KOSERI  Final      Susceptibility   Citrobacter koseri - MIC*    CEFAZOLIN <=4 SENSITIVE Sensitive     CEFEPIME <=1 SENSITIVE Sensitive     CEFTAZIDIME <=1 SENSITIVE Sensitive     CEFTRIAXONE <=1 SENSITIVE Sensitive     CIPROFLOXACIN <=0.25 SENSITIVE Sensitive     GENTAMICIN <=1 SENSITIVE Sensitive     IMIPENEM <=0.25 SENSITIVE Sensitive     TRIMETH/SULFA <=20 SENSITIVE Sensitive     PIP/TAZO <=4 SENSITIVE Sensitive     * CITROBACTER KOSERI  Urine culture     Status: Abnormal   Collection Time: 02/10/17  2:24 PM  Result Value Ref Range Status   Specimen Description   Final    URINE, RANDOM Performed at [facility], [address] [place], [place] [address]    Special Requests   Final    NONE Performed at [facility], [address] [place], [place] [address]    Culture >=100,000 COLONIES/mL CITROBACTER KOSERI (A)  Final   Report Status 02/13/2017 FINAL  Final   Organism ID, Bacteria CITROBACTER KOSERI (A)  Final      Susceptibility   Citrobacter koseri - MIC*    CEFAZOLIN <=4 SENSITIVE Sensitive     CEFTRIAXONE <=1 SENSITIVE Sensitive     CIPROFLOXACIN <=0.25 SENSITIVE Sensitive     GENTAMICIN <=1 SENSITIVE Sensitive     IMIPENEM <=0.25 SENSITIVE Sensitive     NITROFURANTOIN 32 SENSITIVE Sensitive      TRIMETH/SULFA <=20 SENSITIVE Sensitive     PIP/TAZO <=4 SENSITIVE Sensitive     * >=100,000 COLONIES/mL CITROBACTER KOSERI    IMAGING: Ct Head Wo Contrast  Result Date: 02/10/2017 CLINICAL DATA:  22 year old with seizure.  Initial encounter. EXAM: CT HEAD WITHOUT CONTRAST TECHNIQUE: Contiguous axial images were obtained from the base of the skull through the vertex without intravenous contrast. COMPARISON:  09/13/2015, 12/27/2012 and 12/24/2011. FINDINGS: Brain: No intracranial hemorrhage or CT evidence of large acute infarct. 3 x 1.6 x 1.7 cm cystic structure medial to the right hippocampus (deformity hippocampus) unchanged from prior exams. Global atrophy. Falx calcifications Vascular: New negative Skull: Negative Sinuses/Orbits: Visualized orbital structures acute abnormality noted. Minimal mucosal thickening ethmoid sinus air cells and maxillary sinuses. Other: Mastoid air cells and middle ear cavities are clear. IMPRESSION: No intracranial hemorrhage or CT evidence of large acute infarct. Stable 3 x 1.6 x 1.7 cm cystic structure medial  to the right hippocampus (deformity hippocampus). If further delineation is clinically desired, MR can be obtained. Global atrophy. Electronically Signed   By: Genia Del M.D.   On: 02/10/2017 14:25   Dg Chest Port 1 View  Result Date: 02/10/2017 CLINICAL DATA:  Seizures. EXAM: PORTABLE CHEST 1 VIEW COMPARISON:  Single-view of the chest 09/14/2015. FINDINGS: Lung volumes are low. A small focus of airspace opacity is seen in the right base. The left lung is clear. No pneumothorax or pleural effusion. Heart size is normal. Stimulator device with lead extending into the neck is unchanged. IMPRESSION: Small focus of hazy airspace in the right base in this low volume chest is likely atelectasis but could be due to aspiration or pneumonia. The exam is otherwise negative. Electronically Signed   By: Inge Rise M.D.   On: 02/10/2017 14:30    Assessment:   Noah Ortner  Marshall is a 22 y.o. male with hx seizure disorder, autism admitted with seizures and fever and found to have RLL PNA and UTI with citrobacter bacteremia. Clinically much improved with zosyn (day 4). At baseline per family.  He is not on O2 and CXR is not very impressive so I suspect main issue was UTI with bacteremia.   Recommendations Would treat for a total 10 days with coverage of the citrobacter with oral ciprofloxacin 500 bid. Then can be followed off abx.   Thank you very much for allowing me to participate in the care of this patient. Please call with questions.   Cheral Marker. Ola Spurr, MD

## 2017-02-14 ENCOUNTER — Telehealth: Payer: Self-pay | Admitting: Family Medicine

## 2017-02-14 MED ORDER — CIPROFLOXACIN HCL 500 MG PO TABS: 500.0000 mg | ORAL_TABLET | Freq: Two times a day (BID) | ORAL | 0 refills | Status: DC

## 2017-02-14 MED ORDER — CIPROFLOXACIN HCL 500 MG PO TABS
500.0000 mg | ORAL_TABLET | Freq: Two times a day (BID) | ORAL | Status: DC
  Administered 2017-02-14: 500 mg via ORAL
  Filled 2017-02-14: qty 1

## 2017-02-14 MED ORDER — VITAMIN D (ERGOCALCIFEROL) 1.25 MG (50000 UNIT) PO CAPS: 50000.0000 [IU] | ORAL_CAPSULE | ORAL | 0 refills | Status: DC

## 2017-02-14 NOTE — Discharge Summary (Signed)
[employer] [facility] [place] [facility]   PATIENT NAME: Noah Marshall    MR#:  [id number]  DATE OF BIRTH:  06/30/1995  DATE OF ADMISSION:  02/10/2017   ADMITTING PHYSICIAN: Shaune PollackQing Calix Heinbaugh, MD  DATE OF DISCHARGE: 02/14/2017 11:06 AM  PRIMARY CARE PHYSICIAN: Malva LimesFisher, Donald E, MD   ADMISSION DIAGNOSIS:  Seizure (HCC) [R56.9] Sepsis, due to unspecified organism [facility](HCC) [A41.9] Pneumonia of right lower lobe due to infectious organism (HCC) [J18.1] DISCHARGE DIAGNOSIS:  Active Problems:   Sepsis (HCC)  SECONDARY DIAGNOSIS:   Past Medical History:  Diagnosis Date  . Aspiration pneumonia (HCC)   . Autism   . Epilepsy (HCC)   . Epilepsy with status epilepticus (HCC)   . Intracranial arachnoid cysts   . Pes planovalgus   . Premature ventricular contractions   . Respiratory failure [facility](HCC)    HOSPITAL COURSE:  Patient is a 22 year old with history of autism being admitted with sepsis  #1Sepsis due to aspiration pneumonia and UTI, bacteremia. She was treated with IV Zosyn and vancomycin.  Per Dr. Sampson GoonFitzgerald, treat for a total 10 days with coverage of the citrobacter with oral ciprofloxacin 500 bid.  #2 UTI with citrobac with positive blood cultures She was treated with IV Zosyn and vancomycin.  Per Dr. Sampson GoonFitzgerald, treat for a total 10 days with coverage of the citrobacter with oral ciprofloxacin 500 bid.  #3 Seizure activity.  Continue Onfi, trileptal, levocarnitine, depakote, clonipin, topamax per neurology consult.  #4 chronic hyponatremia.  Improved. #5 chronic thrombocytopenia. discontinued Lovenox  Anemia of chronic disease.  Stable. DISCHARGE CONDITIONS:  Stable, discharged to home today. CONSULTS OBTAINED:  Treatment Team:  Pauletta BrownsZeylikman, Yuriy, MD Mick SellFitzgerald, David P, MD DRUG ALLERGIES:   Allergies  Allergen Reactions  . Lactose Intolerance (Gi) Diarrhea  . Peanut-Containing Drug Products Hives   DISCHARGE MEDICATIONS:   Allergies as of 02/14/2017        Reactions   Lactose Intolerance (gi) Diarrhea   Peanut-containing Drug Products Hives      Medication List    TAKE these medications   cetirizine HCl 1 MG/ML solution Commonly known as:  ZYRTEC Take 15 mg by mouth daily.   ciprofloxacin 500 MG tablet Commonly known as:  CIPRO Take 1 tablet (500 mg total) by mouth 2 (two) times daily.   cloBAZam 2.5 MG/ML solution Commonly known as:  ONFI Place 15 mg into feeding tube 2 (two) times daily.   clonazePAM 0.5 MG tablet Commonly known as:  KLONOPIN Take 0.5 mg by mouth 3 (three) times daily.   fluticasone 50 MCG/ACT nasal spray Commonly known as:  FLONASE Place 2 sprays into both nostrils daily.   guanFACINE 1 MG tablet Commonly known as:  TENEX Place 1 mg into feeding tube at bedtime.   levOCARNitine 1 GM/10ML solution Commonly known as:  CARNITOR Take 500 mg by mouth 2 (two) times daily.   montelukast 10 MG tablet Commonly known as:  SINGULAIR Take 10 mg by mouth at bedtime.   OXcarbazepine 300 MG/5ML suspension Commonly known as:  TRILEPTAL Take 600 mg by mouth 2 (two) times daily.   topiramate 200 MG tablet Commonly known as:  TOPAMAX Place 200 mg into feeding tube 2 (two) times daily.   valproic acid 250 MG/5ML syrup Commonly known as:  DEPAKENE Take 500 mg by mouth 2 (two) times daily.   Vitamin D (Ergocalciferol) 50000 units Caps capsule Commonly known as:  DRISDOL Take 1 capsule (50,000 Units total) by mouth every 7 (seven)  days. Start taking on:  02/19/2017        DISCHARGE INSTRUCTIONS:  See AVS.  If you experience worsening of your admission symptoms, develop shortness of breath, life threatening emergency, suicidal or homicidal thoughts you must seek medical attention immediately by calling 911 or calling your MD immediately  if symptoms less severe.  You Must read complete instructions/literature along with all the possible adverse reactions/side effects for all the Medicines you take and  that have been prescribed to you. Take any new Medicines after you have completely understood and accpet all the possible adverse reactions/side effects.   Please note  You were cared for by a hospitalist during your hospital stay. If you have any questions about your discharge medications or the care you received while you were in the hospital after you are discharged, you can call the unit and asked to speak with the hospitalist on call if the hospitalist that took care of you is not available. Once you are discharged, your primary care physician will handle any further medical issues. Please note that NO REFILLS for any discharge medications will be authorized once you are discharged, as it is imperative that you return to your primary care physician (or establish a relationship with a primary care physician if you do not have one) for your aftercare needs so that they can reassess your need for medications and monitor your lab values.    On the day of Discharge:  VITAL SIGNS:  Blood pressure 116/72, pulse 70, temperature 98 F (36.7 C), temperature source Oral, resp. rate 16, height 5\' 6"  (1.676 m), weight 145 lb (65.8 kg), SpO2 100 %. PHYSICAL EXAMINATION:  GENERAL:  22 y.o.-year-old patient lying in the bed with no acute distress.  EYES: Pupils equal, round, reactive to light and accommodation. No scleral icterus. Extraocular muscles intact.  HEENT: Head atraumatic, normocephalic. NECK:  Supple, no jugular venous distention. No thyroid enlargement, no tenderness.  LUNGS: Normal breath sounds bilaterally, no wheezing, rales,rhonchi or crepitation. No use of accessory muscles of respiration.  CARDIOVASCULAR: S1, S2 normal. No murmurs, rubs, or gallops.  ABDOMEN: Soft, non-tender, non-distended. Bowel sounds present. No organomegaly or mass.  EXTREMITIES: No pedal edema, cyanosis, or clubbing.  NEUROLOGIC: Unable to exam. PSYCHIATRIC: The patient is awake but noncommunicative, not follow  commands. SKIN: No obvious rash, lesion, or ulcer.  DATA REVIEW:   CBC Recent Labs  Lab 02/13/17 0608  WBC 5.6  HGB 8.9*  HCT 26.1*  PLT 76*    Chemistries  Recent Labs  Lab 02/10/17 1351  02/13/17 0608  NA 126*   < > 137  K 4.0   < > 3.5  CL 92*   < > 104  CO2 22   < > 20*  GLUCOSE 98   < > 82  BUN 22*   < > 18  CREATININE 0.80   < > 0.52*  CALCIUM 9.1   < > 8.8*  AST 17  --   --   ALT 7*  --   --   ALKPHOS 53  --   --   BILITOT 0.5  --   --    < > = values in this interval not displayed.     Microbiology Results  Results for orders placed or performed during the hospital encounter of 02/10/17  Blood Culture (routine x 2)     Status: Abnormal   Collection Time: 02/10/17  1:51 PM  Result Value Ref Range Status   Specimen  Description   Final    BLOOD LEFT EJ Performed at [facility], [address] [place], [place] [address]    Special Requests   Final    BOTTLES DRAWN AEROBIC AND ANAEROBIC Blood Culture adequate volume Performed at [facility], [address] [place], [place] [address]    Culture  Setup Time   Final    GRAM NEGATIVE RODS IN BOTH AEROBIC AND ANAEROBIC BOTTLES CRITICAL RESULT CALLED TO, READ BACK BY AND VERIFIED WITH: DAVID BESANTI AT 0437 ON 02/11/17 [facility].    Culture (A)  Final    CITROBACTER KOSERI SUSCEPTIBILITIES PERFORMED ON PREVIOUS CULTURE WITHIN THE LAST 5 DAYS. Performed at [facility], [address], [place], [place] [address]    Report Status 02/13/2017 FINAL  Final  Blood Culture ID Panel (Reflexed)     Status: Abnormal   Collection Time: 02/10/17  1:51 PM  Result Value Ref Range Status   Enterococcus species NOT DETECTED NOT DETECTED Final   Listeria monocytogenes NOT DETECTED NOT DETECTED Final   Staphylococcus species NOT DETECTED NOT DETECTED Final   Staphylococcus aureus NOT DETECTED NOT DETECTED Final   Streptococcus species NOT DETECTED NOT DETECTED Final   Streptococcus agalactiae NOT  DETECTED NOT DETECTED Final   Streptococcus pneumoniae NOT DETECTED NOT DETECTED Final   Streptococcus pyogenes NOT DETECTED NOT DETECTED Final   Acinetobacter baumannii NOT DETECTED NOT DETECTED Final   Enterobacteriaceae species DETECTED (A) NOT DETECTED Final    Comment: Enterobacteriaceae represent a large family of gram negative bacteria, not a single organism. Refer to culture for further identification. CRITICAL RESULT CALLED TO, READ BACK BY AND VERIFIED WITH: DAVID BESANTI AT 0437 ON 02/11/17 [facility].    Enterobacter cloacae complex NOT DETECTED NOT DETECTED Final   Escherichia coli NOT DETECTED NOT DETECTED Final   Klebsiella oxytoca NOT DETECTED NOT DETECTED Final   Klebsiella pneumoniae NOT DETECTED NOT DETECTED Final   Proteus species NOT DETECTED NOT DETECTED Final   Serratia marcescens NOT DETECTED NOT DETECTED Final   Carbapenem resistance NOT DETECTED NOT DETECTED Final   Haemophilus influenzae NOT DETECTED NOT DETECTED Final   Neisseria meningitidis NOT DETECTED NOT DETECTED Final   Pseudomonas aeruginosa NOT DETECTED NOT DETECTED Final   Candida albicans NOT DETECTED NOT DETECTED Final   Candida glabrata NOT DETECTED NOT DETECTED Final   Candida krusei NOT DETECTED NOT DETECTED Final   Candida parapsilosis NOT DETECTED NOT DETECTED Final   Candida tropicalis NOT DETECTED NOT DETECTED Final    Comment: Performed at [facility], [address] [place], [place] [address]  Blood Culture (routine x 2)     Status: Abnormal   Collection Time: 02/10/17  1:56 PM  Result Value Ref Range Status   Specimen Description   Final    BLOOD LEFT UPPER ARM Performed at [facility], [address] [place], [place] [address]    Special Requests   Final    BOTTLES DRAWN AEROBIC AND ANAEROBIC Blood Culture adequate volume Performed at [facility], [address] [place], [place] [address]    Culture  Setup Time   Final    GRAM NEGATIVE RODS IN BOTH  AEROBIC AND ANAEROBIC BOTTLES CRITICAL VALUE NOTED.  VALUE IS CONSISTENT WITH PREVIOUSLY REPORTED AND CALLED VALUE.    Culture CITROBACTER KOSERI (A)  Final   Report Status 02/13/2017 FINAL  Final   Organism ID, Bacteria CITROBACTER KOSERI  Final      Susceptibility  Citrobacter koseri - MIC*    CEFAZOLIN <=4 SENSITIVE Sensitive     CEFEPIME <=1 SENSITIVE Sensitive     CEFTAZIDIME <=1 SENSITIVE Sensitive     CEFTRIAXONE <=1 SENSITIVE Sensitive     CIPROFLOXACIN <=0.25 SENSITIVE Sensitive     GENTAMICIN <=1 SENSITIVE Sensitive     IMIPENEM <=0.25 SENSITIVE Sensitive     TRIMETH/SULFA <=20 SENSITIVE Sensitive     PIP/TAZO <=4 SENSITIVE Sensitive     * CITROBACTER KOSERI  Urine culture     Status: Abnormal   Collection Time: 02/10/17  2:24 PM  Result Value Ref Range Status   Specimen Description   Final    URINE, RANDOM Performed at [facility], [address], [place], [place] [address]    Special Requests   Final    NONE Performed at [facility], [address] [place], [place] [address]    Culture >=100,000 COLONIES/mL CITROBACTER KOSERI (A)  Final   Report Status 02/13/2017 FINAL  Final   Organism ID, Bacteria CITROBACTER KOSERI (A)  Final      Susceptibility   Citrobacter koseri - MIC*    CEFAZOLIN <=4 SENSITIVE Sensitive     CEFTRIAXONE <=1 SENSITIVE Sensitive     CIPROFLOXACIN <=0.25 SENSITIVE Sensitive     GENTAMICIN <=1 SENSITIVE Sensitive     IMIPENEM <=0.25 SENSITIVE Sensitive     NITROFURANTOIN 32 SENSITIVE Sensitive     TRIMETH/SULFA <=20 SENSITIVE Sensitive     PIP/TAZO <=4 SENSITIVE Sensitive     * >=100,000 COLONIES/mL CITROBACTER KOSERI    RADIOLOGY:  No results found.   Management plans discussed with the patient, family and they are in agreement.  CODE STATUS: Prior   TOTAL TIME TAKING CARE OF THIS PATIENT: 32 minutes.    Shaune Pollack M.D on 02/14/2017 at 2:22 PM  Between 7am to 6pm - Pager - [phone]  After 6pm go to  [email or web address] - Social research officer, government  [employer] [place] [facility]  Office  [phone]  CC: Primary care physician; Malva Limes, MD   Note: This dictation was prepared with Dragon dictation along with smaller phrase technology. Any transcriptional errors that result from this process are unintentional.

## 2017-02-14 NOTE — Telephone Encounter (Signed)
[facility] called and scheduled a 2 week hospital f/u with Dr. Sherrie MustacheFisher on 02/28/17 @ 9 am. Pt is being discharged today and was treated for sepsis. Thanks TNP

## 2017-02-14 NOTE — Progress Notes (Signed)
Discharge instructions, prescriptions, and follow up appointments reviewed with patient's sister. IVs removed without complication. Sister to transport patient home.

## 2017-02-17 ENCOUNTER — Other Ambulatory Visit: Payer: Self-pay | Admitting: *Deleted

## 2017-02-17 NOTE — Patient Outreach (Signed)
[facility](THN) Care Management  02/17/2017  Noah Marshall 09/03/1995 [id number]   Subjective: Telephone call to patient's home number, no answer, left HIPAA compliant voicemail message, and requested call back.    Objective: Per KPN (Knowledge Performance Now, point of care tool) and chart review, patient hospitalized 02/10/17 -02/14/17 for sepsis due to aspiration pneumonia, and UTI.   Patient also has a history of Seizure, Aspiration pneumonia, Epilepsy with status epilepticus, Pes planovalgus, autism, chronic hyponatremia, chronic thrombocytopenia, and Premature ventricular contractions.   [facility] high cost referral on 07/04/15 due to unable to contact.     Assessment: Received UMR Transition of care referral on 02/11/17.   Transition of care follow up pending patient contact.      Plan: RNCM will call patient for 2nd telephone outreach attempt, transition of care follow up, within 10 business days if no return call.      Tanayah Squitieri H. Gardiner Barefootooper RN, BSN, CCM [facility] Care Management [facility] Telephonic CM Phone: [phone] Fax: [phone]

## 2017-02-18 ENCOUNTER — Ambulatory Visit: Payer: Self-pay | Admitting: *Deleted

## 2017-02-18 ENCOUNTER — Other Ambulatory Visit: Payer: Self-pay | Admitting: *Deleted

## 2017-02-18 NOTE — Patient Outreach (Signed)
[facility](THN) Care Management  02/18/2017  Noah LocusLance A Marshall 04/11/1995 [id number]   Subjective: Telephone call to patient's home number, no answer, left HIPAA compliant voicemail message, and requested call back.    Objective: Per KPN (Knowledge Performance Now, point of care tool) and chart review, patient hospitalized 02/10/17 -02/14/17 for sepsis due to aspiration pneumonia, and UTI.   Patient also has a history of Seizure, Aspiration pneumonia, Epilepsy with status epilepticus, Pes planovalgus, autism, chronic hyponatremia, chronic thrombocytopenia, and Premature ventricular contractions.   [facility] high cost referral on 07/04/15 due to unable to contact.     Assessment: Received UMR Transition of care referral on 02/11/17.   Transition of care follow up pending patient contact.      Plan: RNCM will call patient for 3rd telephone outreach attempt, transition of care follow up, within 10 business days if no return call.      Lynette Topete H. Gardiner Barefootooper RN, BSN, CCM [facility] Care [facility] Telephonic CM Phone: [phone] Fax: [phone]

## 2017-02-19 ENCOUNTER — Other Ambulatory Visit: Payer: Self-pay | Admitting: *Deleted

## 2017-02-19 ENCOUNTER — Encounter: Payer: Self-pay | Admitting: *Deleted

## 2017-02-19 ENCOUNTER — Ambulatory Visit: Payer: Self-pay | Admitting: *Deleted

## 2017-02-19 NOTE — Patient Outreach (Signed)
[facility]N) Care Management  02/19/2017  Percell LocusLance A Aiello 07/22/1995 [id number]   Subjective:Telephone call to patient's home number, no answer, left HIPAA compliant voicemail message, and requested call back.    Objective:Per KPN (Knowledge Performance Now, point of care tool) and chart review,patient hospitalized 02/10/17 -02/14/17 for sepsis due to aspiration pneumonia, and UTI. Patient also has a history of Seizure,Aspiration pneumonia,Epilepsy with status epilepticus,Pes planovalgus, autism,chronic hyponatremia,chronic thrombocytopenia, andPremature ventricular contractions. [facility] closed high cost referral on 07/04/15 due to unable to contact.     Assessment: Received UMR Transition of care referral on 02/11/17.Transition of care follow up pending patient contact.     Plan:RNCM will send unsuccessful outreach  letter, [facility] pamphlet, and proceed with case closure, within 10 business days if no return call.     Yamin Swingler H. Gardiner Barefootooper RN, BSN, CCM [facility] Care Management [facility] Telephonic CM Phone: [phone] Fax: [phone]

## 2017-02-28 ENCOUNTER — Inpatient Hospital Stay: Payer: Medicaid Other | Admitting: Family Medicine

## 2017-03-05 ENCOUNTER — Other Ambulatory Visit: Payer: Self-pay | Admitting: *Deleted

## 2017-03-05 NOTE — Patient Outreach (Signed)
[facility]N) Care Management  03/05/2017  Percell LocusLance A Guild 11/14/1995 [id number]   No response from patient outreach attempts will proceed with case closure.    Objective:Per KPN (Knowledge Performance Now, point of care tool) and chart review,patient hospitalized 02/10/17 -02/14/17 for sepsis due to aspiration pneumonia, and UTI. Patient also has a history of Seizure,Aspiration pneumonia,Epilepsy with status epilepticus,Pes planovalgus, autism,chronic hyponatremia,chronic thrombocytopenia, andPremature ventricular contractions. [facility] closed high cost referral on 07/04/15 due to unable to contact.     Assessment: Received UMR Transition of care referral on 02/11/17.Transition of care follow up not completed due to unable to contact patient and will proceed with case closure.     Plan:RNCM will send case closure due to unable to reach request to Iverson AlaminLaura Greeson at [facility] Care [facility].     Cristy Colmenares H. Gardiner Barefootooper RN, BSN, CCM [facility] Care [facility] Telephonic CM Phone: [phone] Fax: [phone]

## 2017-03-06 ENCOUNTER — Encounter: Payer: Self-pay | Admitting: Family Medicine

## 2017-03-06 ENCOUNTER — Ambulatory Visit (INDEPENDENT_AMBULATORY_CARE_PROVIDER_SITE_OTHER): Payer: 59 | Admitting: Family Medicine

## 2017-03-06 VITALS — BP 124/84 | HR 99 | Temp 98.0°F | Resp 16 | Ht 67.0 in | Wt 139.0 lb

## 2017-03-06 DIAGNOSIS — A419 Sepsis, unspecified organism: Secondary | ICD-10-CM

## 2017-03-06 DIAGNOSIS — J189 Pneumonia, unspecified organism: Secondary | ICD-10-CM | POA: Diagnosis not present

## 2017-03-06 NOTE — Progress Notes (Signed)
Patient: Noah MorelLance A Freeney Male    DOB: 02/18/1995   22 y.o.   MRN: [id number] Visit Date: 03/06/2017  Today's Provider: Mila Merryonald Emori Mumme, MD   Chief Complaint  Patient presents with  . Hospitalization Follow-up   Subjective:    HPI   Follow up Hospitalization  [facility] and discharged on 02/14/2017.  He was treated for; Sepsis, due to unspecified organism and  Pneumonia of right lower lobe due to infectious organism. Treatment for this included IV Zosyn and vancomycin. Dr. Sampson GoonFitzgerald ordered a total of 10 days with coverage of the citrobacter which grew in blood and urine cultures with oral ciprofloxacin 500 bid.. Telephone follow up was done on 02/14/2017 He reports good compliance with treatment. He reports this condition is Improved.  ----------------------------------------------------------------    Patient is feeling better and has completed antibiotic that was prescribed at discharge. Has had no cough, fever, change in urination. Seizures are at baseline and well controlled. He is eating well.    Allergies  Allergen Reactions  . Lactose Intolerance (Gi) Diarrhea  . Peanut-Containing Drug Products Hives     Current Outpatient Medications:  .  cetirizine HCl (ZYRTEC) 1 MG/ML solution, Take 15 mg by mouth daily., Disp: , Rfl:  .  cloBAZam (ONFI) 2.5 MG/ML solution, Place 15 mg into feeding tube 2 (two) times daily. , Disp: , Rfl:  .  clonazePAM (KLONOPIN) 0.5 MG tablet, Take 0.5 mg by mouth 3 (three) times daily. , Disp: , Rfl:  .  fluticasone (FLONASE) 50 MCG/ACT nasal spray, Place 2 sprays into both nostrils daily., Disp: , Rfl:  .  guanFACINE (TENEX) 1 MG tablet, Place 1 mg into feeding tube at bedtime. , Disp: , Rfl:  .  levOCARNitine (CARNITOR) 1 GM/10ML solution, Take 500 mg by mouth 2 (two) times daily., Disp: , Rfl:  .  montelukast (SINGULAIR) 10 MG tablet, Take 10 mg by mouth at bedtime., Disp: , Rfl:  .  OXcarbazepine  (TRILEPTAL) 300 MG/5ML suspension, Take 600 mg by mouth 2 (two) times daily. , Disp: , Rfl:  .  topiramate (TOPAMAX) 200 MG tablet, Place 200 mg into feeding tube 2 (two) times daily. , Disp: , Rfl:  .  valproic acid (DEPAKENE) 250 MG/5ML syrup, Take 500 mg by mouth 2 (two) times daily., Disp: , Rfl:  .  Vitamin D, Ergocalciferol, (DRISDOL) 50000 units CAPS capsule, Take 1 capsule (50,000 Units total) by mouth every 7 (seven) days., Disp: 6 capsule, Rfl: 0  Review of Systems  Constitutional: Negative for appetite change, chills and fever.  Respiratory: Negative for chest tightness, shortness of breath and wheezing.   Cardiovascular: Negative for chest pain and palpitations.  Gastrointestinal: Negative for abdominal pain, nausea and vomiting.    Social History   Tobacco Use  . Smoking status: Never Smoker  . Smokeless tobacco: Never Used  Substance Use Topics  . Alcohol use: No    Frequency: Never   Objective:   BP 124/84 (BP Location: Right Arm, Patient Position: Sitting, Cuff Size: Normal)   Pulse 99   Temp 98 F (36.7 C) (Oral)   Resp 16   Ht 5\' 7"  (1.702 m)   Wt 139 lb (63 kg)   SpO2 97%   BMI 21.77 kg/m  Vitals:   03/06/17 0821  BP: 124/84  Pulse: 99  Resp: 16  Temp: 98 F (36.7 C)  TempSrc: Oral  SpO2: 97%  Weight: 139 lb (63  kg)  Height: 5\' 7"  (1.702 m)     Physical Exam    General Appearance:    Alert, cooperative, no distress  Eyes:    PERRL, conjunctiva/corneas clear, EOM's intact       Lungs:     Clear to auscultation bilaterally, respirations unlabored  Heart:    Regular rate and rhythm          Assessment & Plan:     1. Pneumonia due to infectious organism, unspecified laterality, unspecified part of lung   2. Sepsis, due to unspecified organism (HCC)  Completely resolved. Completed all antibiotic. Is back to baseline. Resume chronic care plan.        Mila Merry, MD  [facility]

## 2017-05-13 DIAGNOSIS — R569 Unspecified convulsions: Secondary | ICD-10-CM | POA: Diagnosis not present

## 2017-05-13 DIAGNOSIS — G40311 Generalized idiopathic epilepsy and epileptic syndromes, intractable, with status epilepticus: Secondary | ICD-10-CM | POA: Diagnosis not present

## 2017-05-13 DIAGNOSIS — G40901 Epilepsy, unspecified, not intractable, with status epilepticus: Secondary | ICD-10-CM | POA: Diagnosis not present

## 2017-05-13 DIAGNOSIS — G40909 Epilepsy, unspecified, not intractable, without status epilepticus: Secondary | ICD-10-CM | POA: Diagnosis not present

## 2017-05-29 DIAGNOSIS — J301 Allergic rhinitis due to pollen: Secondary | ICD-10-CM | POA: Diagnosis not present

## 2017-05-29 DIAGNOSIS — J4530 Mild persistent asthma, uncomplicated: Secondary | ICD-10-CM | POA: Diagnosis not present

## 2017-05-29 DIAGNOSIS — J3089 Other allergic rhinitis: Secondary | ICD-10-CM | POA: Diagnosis not present

## 2017-05-29 DIAGNOSIS — Z91010 Allergy to peanuts: Secondary | ICD-10-CM | POA: Diagnosis not present

## 2018-02-17 DIAGNOSIS — Z23 Encounter for immunization: Secondary | ICD-10-CM | POA: Diagnosis not present

## 2018-02-17 DIAGNOSIS — G40311 Generalized idiopathic epilepsy and epileptic syndromes, intractable, with status epilepticus: Secondary | ICD-10-CM | POA: Diagnosis not present

## 2018-02-17 DIAGNOSIS — G40909 Epilepsy, unspecified, not intractable, without status epilepticus: Secondary | ICD-10-CM | POA: Diagnosis not present

## 2018-02-17 DIAGNOSIS — R569 Unspecified convulsions: Secondary | ICD-10-CM | POA: Diagnosis not present

## 2018-05-14 DIAGNOSIS — Z91010 Allergy to peanuts: Secondary | ICD-10-CM | POA: Diagnosis not present

## 2018-05-14 DIAGNOSIS — J4530 Mild persistent asthma, uncomplicated: Secondary | ICD-10-CM | POA: Diagnosis not present

## 2018-05-14 DIAGNOSIS — J301 Allergic rhinitis due to pollen: Secondary | ICD-10-CM | POA: Diagnosis not present

## 2018-05-14 DIAGNOSIS — J3089 Other allergic rhinitis: Secondary | ICD-10-CM | POA: Diagnosis not present

## 2018-12-22 DIAGNOSIS — Z23 Encounter for immunization: Secondary | ICD-10-CM | POA: Diagnosis not present

## 2018-12-22 DIAGNOSIS — R569 Unspecified convulsions: Secondary | ICD-10-CM | POA: Diagnosis not present

## 2018-12-22 DIAGNOSIS — G40909 Epilepsy, unspecified, not intractable, without status epilepticus: Secondary | ICD-10-CM | POA: Diagnosis not present

## 2018-12-22 DIAGNOSIS — G40419 Other generalized epilepsy and epileptic syndromes, intractable, without status epilepticus: Secondary | ICD-10-CM | POA: Diagnosis not present

## 2019-03-11 IMAGING — DX DG CHEST 1V PORT
1 series · 1 of 1 positions shown · non-contrast
Comparison: Single-view of the chest 09/14/2015.

CLINICAL DATA: Seizures.

EXAM:
PORTABLE CHEST 1 VIEW

[chest ap]
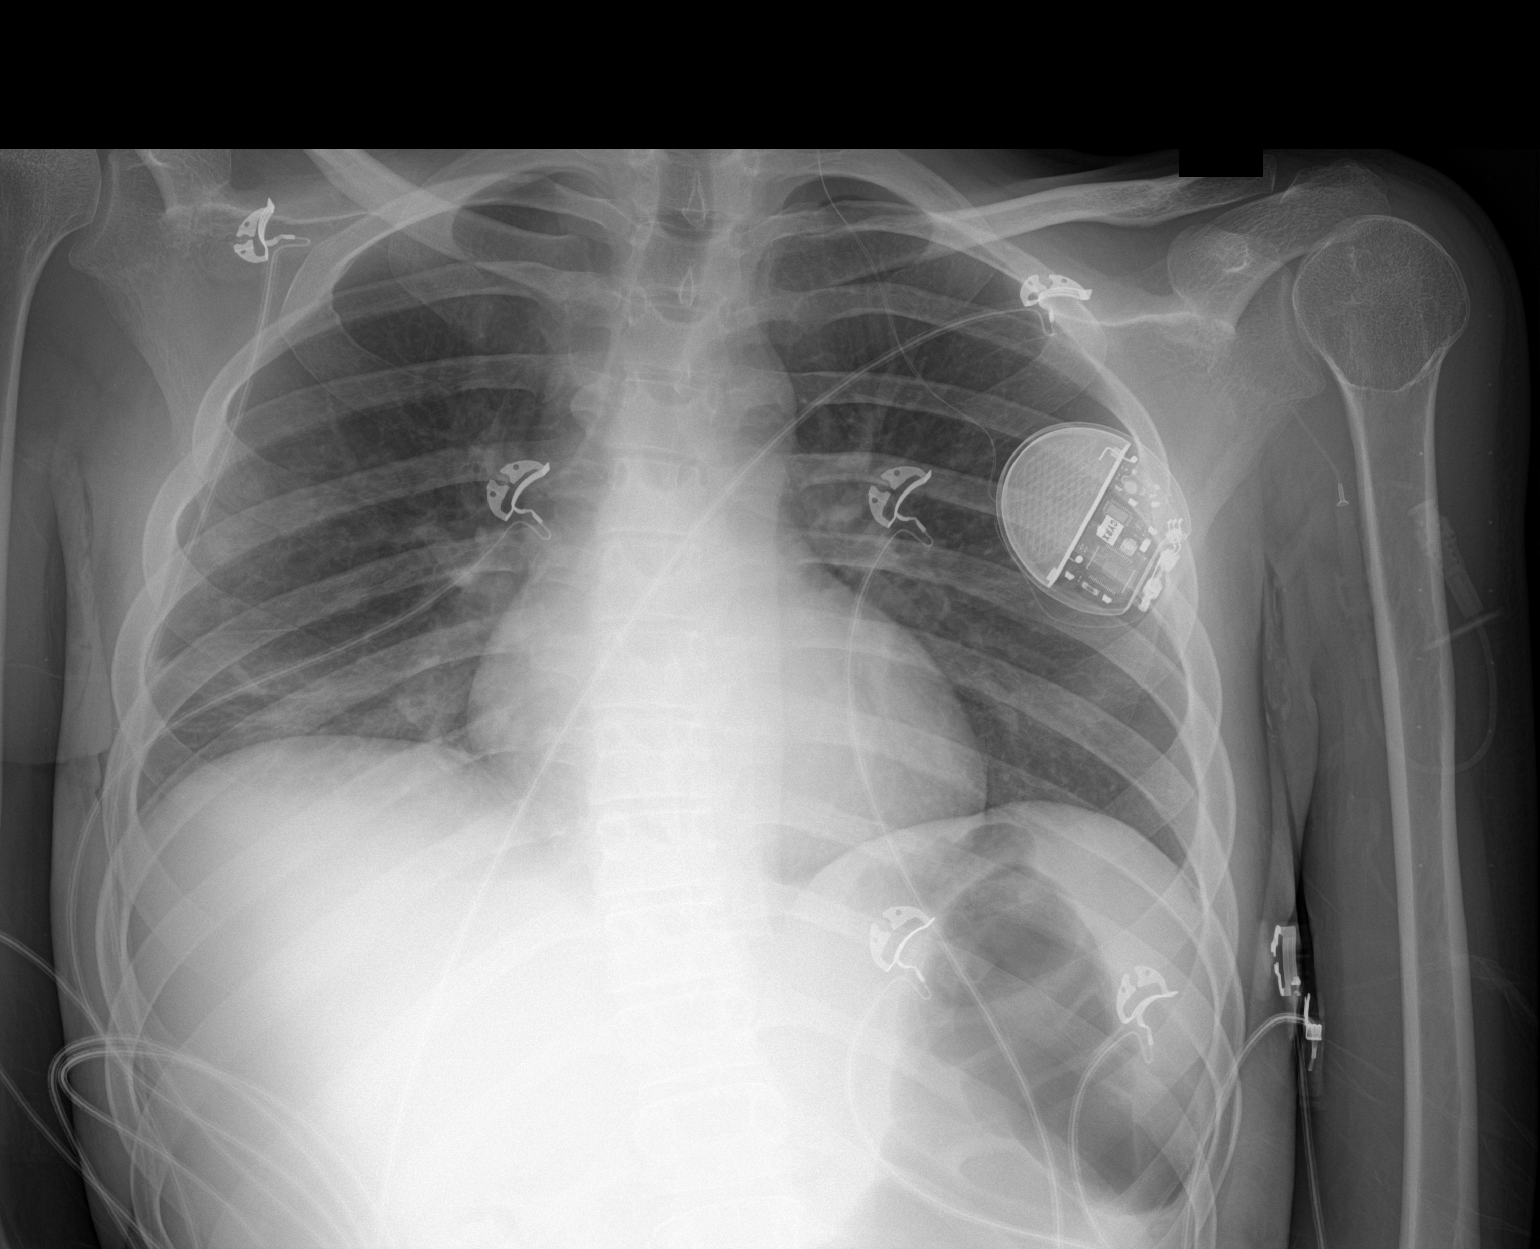

[1 of 1 positions shown; findings below may reference images not displayed]

FINDINGS: Lung volumes are low. A small focus of airspace opacity is seen in
the right base. The left lung is clear. No pneumothorax or pleural
effusion. Heart size is normal. Stimulator device with lead
extending into the neck is unchanged.
IMPRESSION: Small focus of hazy airspace in the right base in this low volume
chest is likely atelectasis but could be due to aspiration or
pneumonia. The exam is otherwise negative.

## 2019-07-13 ENCOUNTER — Other Ambulatory Visit: Payer: Self-pay | Admitting: Neurology

## 2019-09-22 DIAGNOSIS — G40419 Other generalized epilepsy and epileptic syndromes, intractable, without status epilepticus: Secondary | ICD-10-CM | POA: Diagnosis not present

## 2019-10-01 DIAGNOSIS — R6259 Other lack of expected normal physiological development in childhood: Secondary | ICD-10-CM | POA: Diagnosis not present

## 2019-10-01 DIAGNOSIS — F840 Autistic disorder: Secondary | ICD-10-CM | POA: Diagnosis not present

## 2019-10-01 DIAGNOSIS — G40219 Localization-related (focal) (partial) symptomatic epilepsy and epileptic syndromes with complex partial seizures, intractable, without status epilepticus: Secondary | ICD-10-CM | POA: Diagnosis not present

## 2019-10-01 DIAGNOSIS — Z4542 Encounter for adjustment and management of neuropacemaker (brain) (peripheral nerve) (spinal cord): Secondary | ICD-10-CM | POA: Diagnosis not present

## 2019-10-01 DIAGNOSIS — J4520 Mild intermittent asthma, uncomplicated: Secondary | ICD-10-CM | POA: Diagnosis not present

## 2019-10-01 DIAGNOSIS — Z91011 Allergy to milk products: Secondary | ICD-10-CM | POA: Diagnosis not present

## 2019-10-01 DIAGNOSIS — Z91010 Allergy to peanuts: Secondary | ICD-10-CM | POA: Diagnosis not present

## 2019-10-13 DIAGNOSIS — G40419 Other generalized epilepsy and epileptic syndromes, intractable, without status epilepticus: Secondary | ICD-10-CM | POA: Diagnosis not present

## 2019-10-26 DIAGNOSIS — G40419 Other generalized epilepsy and epileptic syndromes, intractable, without status epilepticus: Secondary | ICD-10-CM | POA: Diagnosis not present

## 2019-10-26 DIAGNOSIS — Z23 Encounter for immunization: Secondary | ICD-10-CM | POA: Diagnosis not present

## 2019-10-26 DIAGNOSIS — Z79899 Other long term (current) drug therapy: Secondary | ICD-10-CM | POA: Diagnosis not present

## 2019-10-26 DIAGNOSIS — G40901 Epilepsy, unspecified, not intractable, with status epilepticus: Secondary | ICD-10-CM | POA: Diagnosis not present

## 2019-12-06 ENCOUNTER — Other Ambulatory Visit: Payer: Self-pay | Admitting: Neurology

## 2019-12-29 ENCOUNTER — Other Ambulatory Visit: Payer: Self-pay | Admitting: Physician Assistant

## 2020-01-12 ENCOUNTER — Other Ambulatory Visit: Payer: Self-pay | Admitting: Neurology

## 2020-01-18 ENCOUNTER — Other Ambulatory Visit: Payer: Self-pay | Admitting: Neurology

## 2020-02-15 ENCOUNTER — Other Ambulatory Visit: Payer: Self-pay | Admitting: Neurology

## 2020-03-30 ENCOUNTER — Other Ambulatory Visit (HOSPITAL_BASED_OUTPATIENT_CLINIC_OR_DEPARTMENT_OTHER): Payer: Self-pay

## 2020-04-22 ENCOUNTER — Inpatient Hospital Stay
Admission: EM | Admit: 2020-04-22 | Discharge: 2020-04-24 | DRG: 178 | Disposition: A | Discharge status: Home / Self Care | Payer: 59 | Attending: Internal Medicine | Admitting: Internal Medicine

## 2020-04-22 ENCOUNTER — Emergency Department: Payer: 59

## 2020-04-22 ENCOUNTER — Other Ambulatory Visit: Payer: Self-pay

## 2020-04-22 DIAGNOSIS — G40909 Epilepsy, unspecified, not intractable, without status epilepticus: Secondary | ICD-10-CM | POA: Diagnosis not present

## 2020-04-22 DIAGNOSIS — Z91011 Allergy to milk products: Secondary | ICD-10-CM

## 2020-04-22 DIAGNOSIS — J690 Pneumonitis due to inhalation of food and vomit: Secondary | ICD-10-CM | POA: Diagnosis not present

## 2020-04-22 DIAGNOSIS — A419 Sepsis, unspecified organism: Secondary | ICD-10-CM | POA: Diagnosis not present

## 2020-04-22 DIAGNOSIS — D696 Thrombocytopenia, unspecified: Secondary | ICD-10-CM | POA: Diagnosis present

## 2020-04-22 DIAGNOSIS — F840 Autistic disorder: Secondary | ICD-10-CM | POA: Diagnosis present

## 2020-04-22 DIAGNOSIS — D649 Anemia, unspecified: Secondary | ICD-10-CM | POA: Diagnosis present

## 2020-04-22 DIAGNOSIS — R569 Unspecified convulsions: Secondary | ICD-10-CM

## 2020-04-22 DIAGNOSIS — Z20822 Contact with and (suspected) exposure to covid-19: Secondary | ICD-10-CM | POA: Diagnosis present

## 2020-04-22 DIAGNOSIS — G40011 Localization-related (focal) (partial) idiopathic epilepsy and epileptic syndromes with seizures of localized onset, intractable, with status epilepticus: Secondary | ICD-10-CM | POA: Diagnosis not present

## 2020-04-22 DIAGNOSIS — Z91010 Allergy to peanuts: Secondary | ICD-10-CM | POA: Diagnosis not present

## 2020-04-22 DIAGNOSIS — Z79899 Other long term (current) drug therapy: Secondary | ICD-10-CM

## 2020-04-22 DIAGNOSIS — G40009 Localization-related (focal) (partial) idiopathic epilepsy and epileptic syndromes with seizures of localized onset, not intractable, without status epilepticus: Secondary | ICD-10-CM | POA: Diagnosis not present

## 2020-04-22 DIAGNOSIS — E164 Increased secretion of gastrin: Secondary | ICD-10-CM | POA: Diagnosis not present

## 2020-04-22 DIAGNOSIS — E876 Hypokalemia: Secondary | ICD-10-CM | POA: Diagnosis present

## 2020-04-22 DIAGNOSIS — R404 Transient alteration of awareness: Secondary | ICD-10-CM | POA: Diagnosis not present

## 2020-04-22 DIAGNOSIS — J9811 Atelectasis: Secondary | ICD-10-CM | POA: Diagnosis not present

## 2020-04-22 LAB — CBC WITH DIFFERENTIAL/PLATELET
Abs Immature Granulocytes: 0.04 10*3/uL (ref 0.00–0.07)
Basophils Absolute: 0 10*3/uL (ref 0.0–0.1)
Basophils Relative: 0 %
Eosinophils Absolute: 0.3 10*3/uL (ref 0.0–0.5)
Eosinophils Relative: 3 %
HCT: 37.8 % — ABNORMAL LOW (ref 39.0–52.0)
Hemoglobin: 12.8 g/dL — ABNORMAL LOW (ref 13.0–17.0)
Immature Granulocytes: 0 %
Lymphocytes Relative: 12 %
Lymphs Abs: 1.3 10*3/uL (ref 0.7–4.0)
MCH: 30.5 pg (ref 26.0–34.0)
MCHC: 33.9 g/dL (ref 30.0–36.0)
MCV: 90.2 fL (ref 80.0–100.0)
Monocytes Absolute: 1.2 10*3/uL — ABNORMAL HIGH (ref 0.1–1.0)
Monocytes Relative: 11 %
Neutro Abs: 8 10*3/uL — ABNORMAL HIGH (ref 1.7–7.7)
Neutrophils Relative %: 74 %
Platelets: 148 10*3/uL — ABNORMAL LOW (ref 150–400)
RBC: 4.19 MIL/uL — ABNORMAL LOW (ref 4.22–5.81)
RDW: 13.4 % (ref 11.5–15.5)
WBC: 10.8 10*3/uL — ABNORMAL HIGH (ref 4.0–10.5)
nRBC: 0 % (ref 0.0–0.2)

## 2020-04-22 LAB — COMPREHENSIVE METABOLIC PANEL
ALT: 13 U/L (ref 0–44)
AST: 19 U/L (ref 15–41)
Albumin: 3.8 g/dL (ref 3.5–5.0)
Alkaline Phosphatase: 60 U/L (ref 38–126)
Anion gap: 6 (ref 5–15)
BUN: 22 mg/dL — ABNORMAL HIGH (ref 6–20)
CO2: 24 mmol/L (ref 22–32)
Calcium: 9 mg/dL (ref 8.9–10.3)
Chloride: 110 mmol/L (ref 98–111)
Creatinine, Ser: 0.4 mg/dL — ABNORMAL LOW (ref 0.61–1.24)
GFR, Estimated: >60 mL/min (ref >60)
Glucose, Bld: 84 mg/dL (ref 70–99)
Potassium: 3.6 mmol/L (ref 3.5–5.1)
Sodium: 140 mmol/L (ref 135–145)
Total Bilirubin: 0.6 mg/dL (ref 0.3–1.2)
Total Protein: 6.8 g/dL (ref 6.5–8.1)

## 2020-04-22 LAB — URINALYSIS, ROUTINE W REFLEX MICROSCOPIC
Bilirubin Urine: NEGATIVE
Glucose, UA: NEGATIVE mg/dL
Hgb urine dipstick: NEGATIVE
Ketones, ur: NEGATIVE mg/dL
Leukocytes,Ua: NEGATIVE
Nitrite: NEGATIVE
Protein, ur: NEGATIVE mg/dL
Specific Gravity, Urine: 1.021 (ref 1.005–1.030)
pH: 7 (ref 5.0–8.0)

## 2020-04-22 LAB — LACTIC ACID, PLASMA: Lactic Acid, Venous: 1.6 mmol/L (ref 0.5–1.9)

## 2020-04-22 LAB — RESP PANEL BY RT-PCR (FLU A&B, COVID) ARPGX2
Influenza A by PCR: NEGATIVE
Influenza B by PCR: NEGATIVE
SARS Coronavirus 2 by RT PCR: NEGATIVE

## 2020-04-22 LAB — PROCALCITONIN: Procalcitonin: 0.69 ng/mL

## 2020-04-22 LAB — STREP PNEUMONIAE URINARY ANTIGEN: Strep Pneumo Urinary Antigen: NEGATIVE

## 2020-04-22 LAB — HIV ANTIBODY (ROUTINE TESTING W REFLEX): HIV Screen 4th Generation wRfx: NONREACTIVE

## 2020-04-22 MED ORDER — LORAZEPAM 2 MG/ML IJ SOLN
2.0000 mg | INTRAMUSCULAR | Status: DC | PRN
  Filled 2020-04-22: qty 1

## 2020-04-22 MED ORDER — ALBUTEROL SULFATE HFA 108 (90 BASE) MCG/ACT IN AERS
2.0000 | INHALATION_SPRAY | RESPIRATORY_TRACT | Status: DC | PRN
  Filled 2020-04-22: qty 6.7

## 2020-04-22 MED ORDER — DM-GUAIFENESIN ER 30-600 MG PO TB12: 1.0000 | ORAL_TABLET | Freq: Two times a day (BID) | ORAL | Status: DC | PRN

## 2020-04-22 MED ORDER — VALPROIC ACID 250 MG/5ML PO SOLN
500.0000 mg | Freq: Two times a day (BID) | ORAL | Status: DC
  Administered 2020-04-22 – 2020-04-24 (×4): 500 mg via ORAL
  Filled 2020-04-22 (×5): qty 10

## 2020-04-22 MED ORDER — LEVOCARNITINE 1 GM/10ML PO SOLN
500.0000 mg | Freq: Two times a day (BID) | ORAL | Status: DC
  Administered 2020-04-22 – 2020-04-24 (×4): 500 mg via ORAL
  Filled 2020-04-22 (×7): qty 5

## 2020-04-22 MED ORDER — OXCARBAZEPINE 300 MG/5ML PO SUSP
600.0000 mg | Freq: Two times a day (BID) | ORAL | Status: DC
  Administered 2020-04-22 – 2020-04-24 (×4): 600 mg via ORAL
  Filled 2020-04-22 (×5): qty 10

## 2020-04-22 MED ORDER — VALPROIC ACID 250 MG/5ML PO SOLN
500.0000 mg | Freq: Once | ORAL | Status: AC
  Administered 2020-04-22: 500 mg via ORAL
  Filled 2020-04-22: qty 10

## 2020-04-22 MED ORDER — CLONAZEPAM 0.5 MG PO TABS: 0.5000 mg | ORAL_TABLET | Freq: Three times a day (TID) | ORAL | Status: DC

## 2020-04-22 MED ORDER — LEVETIRACETAM IN NACL 1000 MG/100ML IV SOLN
1000.0000 mg | Freq: Once | INTRAVENOUS | Status: AC
  Administered 2020-04-22: 1000 mg via INTRAVENOUS
  Filled 2020-04-22: qty 100

## 2020-04-22 MED ORDER — FLUTICASONE PROPIONATE 50 MCG/ACT NA SUSP
2.0000 | Freq: Every day | NASAL | Status: DC
  Administered 2020-04-23 – 2020-04-24 (×2): 2 via NASAL
  Filled 2020-04-22 (×2): qty 16

## 2020-04-22 MED ORDER — CLONAZEPAM 0.5 MG PO TABS
0.5000 mg | ORAL_TABLET | Freq: Three times a day (TID) | ORAL | Status: DC
  Administered 2020-04-22 – 2020-04-24 (×6): 0.5 mg via ORAL
  Filled 2020-04-22 (×6): qty 1

## 2020-04-22 MED ORDER — LEVOCARNITINE 1 GM/10ML PO SOLN
500.0000 mg | Freq: Once | ORAL | Status: AC
  Administered 2020-04-22: 500 mg via ORAL
  Filled 2020-04-22: qty 5

## 2020-04-22 MED ORDER — CLOBAZAM 10 MG PO TABS
15.0000 mg | ORAL_TABLET | Freq: Two times a day (BID) | ORAL | Status: DC
  Administered 2020-04-22 – 2020-04-24 (×4): 15 mg via ORAL
  Filled 2020-04-22 (×4): qty 2

## 2020-04-22 MED ORDER — LACTATED RINGERS IV SOLN: INTRAVENOUS | Status: AC

## 2020-04-22 MED ORDER — LACTATED RINGERS IV BOLUS: 1000.0000 mL | Freq: Once | INTRAVENOUS | Status: DC

## 2020-04-22 MED ORDER — LORAZEPAM 2 MG/ML IJ SOLN
2.0000 mg | INTRAMUSCULAR | Status: DC | PRN
  Administered 2020-04-22: 22:00:00 2 mg via INTRAVENOUS

## 2020-04-22 MED ORDER — ACETAMINOPHEN 325 MG PO TABS: 650.0000 mg | ORAL_TABLET | Freq: Four times a day (QID) | ORAL | Status: DC | PRN

## 2020-04-22 MED ORDER — SODIUM CHLORIDE 0.9 % IV SOLN
3.0000 g | Freq: Four times a day (QID) | INTRAVENOUS | Status: DC
  Administered 2020-04-22 – 2020-04-24 (×7): 3 g via INTRAVENOUS
  Filled 2020-04-22 (×6): qty 8
  Filled 2020-04-22: qty 3
  Filled 2020-04-22 (×3): qty 8

## 2020-04-22 MED ORDER — CLOBAZAM 10 MG PO TABS
15.0000 mg | ORAL_TABLET | Freq: Once | ORAL | Status: AC
  Administered 2020-04-22: 15 mg via ORAL
  Filled 2020-04-22: qty 2

## 2020-04-22 MED ORDER — ACETAMINOPHEN 650 MG RE SUPP: 650.0000 mg | Freq: Four times a day (QID) | RECTAL | Status: DC | PRN

## 2020-04-22 MED ORDER — OXCARBAZEPINE 300 MG/5ML PO SUSP
600.0000 mg | Freq: Once | ORAL | Status: AC
  Administered 2020-04-22: 600 mg via ORAL
  Filled 2020-04-22: qty 10

## 2020-04-22 MED ORDER — LACTATED RINGERS IV BOLUS (SEPSIS)
1000.0000 mL | Freq: Once | INTRAVENOUS | Status: AC
  Administered 2020-04-22: 1000 mL via INTRAVENOUS

## 2020-04-22 MED ORDER — MONTELUKAST SODIUM 10 MG PO TABS
10.0000 mg | ORAL_TABLET | Freq: Every day | ORAL | Status: DC
  Administered 2020-04-23: 22:00:00 10 mg via ORAL
  Filled 2020-04-22: qty 1

## 2020-04-22 MED ORDER — ENOXAPARIN SODIUM 40 MG/0.4ML ~~LOC~~ SOLN
40.0000 mg | SUBCUTANEOUS | Status: DC
  Administered 2020-04-22 – 2020-04-23 (×2): 40 mg via SUBCUTANEOUS
  Filled 2020-04-22 (×2): qty 0.4

## 2020-04-22 MED ORDER — ACETAMINOPHEN 650 MG RE SUPP
650.0000 mg | Freq: Once | RECTAL | Status: AC
  Administered 2020-04-22: 650 mg via RECTAL
  Filled 2020-04-22: qty 1

## 2020-04-22 MED ORDER — SODIUM CHLORIDE 0.9 % IV SOLN
3.0000 g | Freq: Once | INTRAVENOUS | Status: AC
  Administered 2020-04-22: 3 g via INTRAVENOUS
  Filled 2020-04-22: qty 8

## 2020-04-22 MED ORDER — CETIRIZINE HCL 5 MG/5ML PO SOLN
15.0000 mg | Freq: Every day | ORAL | Status: DC
  Administered 2020-04-23: 15 mg via ORAL
  Filled 2020-04-22 (×9): qty 15

## 2020-04-22 MED ORDER — LACTATED RINGERS IV BOLUS
1000.0000 mL | Freq: Once | INTRAVENOUS | Status: AC
  Administered 2020-04-22: 1000 mL via INTRAVENOUS

## 2020-04-22 MED ORDER — TOPIRAMATE 25 MG PO TABS
200.0000 mg | ORAL_TABLET | Freq: Once | ORAL | Status: AC
  Administered 2020-04-22: 200 mg via ORAL
  Filled 2020-04-22: qty 8

## 2020-04-22 MED ORDER — CLOBAZAM 10 MG PO TABS: 15.0000 mg | ORAL_TABLET | Freq: Once | ORAL | Status: DC

## 2020-04-22 MED ORDER — ONDANSETRON HCL 4 MG/2ML IJ SOLN: 4.0000 mg | Freq: Three times a day (TID) | INTRAMUSCULAR | Status: DC | PRN

## 2020-04-22 MED ORDER — TOPIRAMATE 100 MG PO TABS
200.0000 mg | ORAL_TABLET | Freq: Two times a day (BID) | ORAL | Status: DC
  Administered 2020-04-22 – 2020-04-24 (×4): 200 mg via ORAL
  Filled 2020-04-22 (×5): qty 2

## 2020-04-22 MED ORDER — LEVETIRACETAM IN NACL 500 MG/100ML IV SOLN
500.0000 mg | Freq: Two times a day (BID) | INTRAVENOUS | Status: DC
  Administered 2020-04-23 – 2020-04-24 (×3): 500 mg via INTRAVENOUS
  Filled 2020-04-22 (×4): qty 100

## 2020-04-22 NOTE — ED Notes (Signed)
Pt incontinent bowel and bladder, pericare provded

## 2020-04-22 NOTE — ED Notes (Signed)
Pt soiled with feces at this time. Pt pad, brief, and gown changed at this time.

## 2020-04-22 NOTE — Progress Notes (Signed)
Cross Cover Patient with frequent seizure activity lasting app 30 seconds according to nurse and mom at bedside.  Mom reports use of keppra IV in past at times of increased seizure occurrences.   Load 1000 mg IV keppra F/b 500 every 12 hours F/u with neurology in am keppra level in am

## 2020-04-22 NOTE — Progress Notes (Signed)
Patient aspirated prior to admission. Speech has seen patient and made adjustments

## 2020-04-22 NOTE — H&P (Signed)
History and Physical    Noah Marshall IZT:[id number] DOB: 06/18/1995 DOA: 04/22/2020  Referring MD/NP/PA:   PCP: Malva Limes, MD   Patient coming from:  The patient is coming from home.  At baseline, pt is dependent for most of ADL.        Chief Complaint: seizure   HPI: Noah Marshall is a 25 y.o. male with medical history significant of autism, nonverbal status, epilepsy, PVC, intracranial arachnoid cyst, aspiration pneumonia, who presents with seizure.  Per his parents at the bedside, pt had a choking episode last night. They were concerned about giving him his nightly medications.  Therefore they did not give him any of his seizure medications last night. Pt is on clobazam, klonopin, topamax, trileptal, depakene for seizure. This morning he had a generalized tonic-clonic seizure which lasted about 30 to 45 seconds.  Per report, patient was initially postictal status, but mental status improved to normal baseline when I saw patient in ED. Pt has some cough, no chest pain, shortness of breath.  Patient has loose stool, no nausea,vomiting or abdominal pain.  No symptoms of UTI. He has fever of 100.1 in ED.  ED Course: pt was found to have WBC 10.8, lactic acid 1.6, negative Covid PCR, electrolytes renal function okay, temperature 100.1, tachycardia with heart rate 118, soft blood pressure, RR 17, oxygen saturation 99% on room air.  Chest x-ray showed mild left lower lobe infiltration concerning for aspiration.  Patient is admitted to MedSurg bed as inpatient  Review of Systems: Could not be reviewed since patient is nonverbal.   Allergy:  Allergies  Allergen Reactions  . Lactose Intolerance (Gi) Diarrhea  . Peanut-Containing Drug Products Hives    Past Medical History:  Diagnosis Date  . Aspiration pneumonia (HCC)   . Autism   . Epilepsy (HCC)   . Epilepsy with status epilepticus (HCC)   . Intracranial arachnoid cysts   . Pes planovalgus   . Pneumonia 09/26/2015  . Premature  ventricular contractions   . Respiratory failure [facility])     Past Surgical History:  Procedure Laterality Date  . FOREIGN BODY REMOVAL N/A 03/08/2015   Procedure: FOREIGN BODY REMOVAL;  Surgeon: Elnita Maxwell, MD;  Location: [facility] ENDOSCOPY;  Service: Endoscopy;  Laterality: N/A;  . IMPLANTATION VAGAL NERVE STIMULATOR  2011  . REPLACE / REVISE VAGAL NERVE STIMULATOR      Social History:  reports that he has never smoked. He has never used smokeless tobacco. He reports that he does not drink alcohol and does not use drugs.  Family History:  Family History  Problem Relation Age of Onset  . Diabetes Mellitus II Father   . Hypertension Father      Prior to Admission medications   Medication Sig Start Date End Date Taking? Authorizing Provider  cetirizine HCl (ZYRTEC) 1 MG/ML solution Take 15 mg by mouth daily.   Yes [provider]  cloBAZam (ONFI) 2.5 MG/ML solution Place 15 mg into feeding tube 2 (two) times daily.    Yes [provider]  clonazePAM (KLONOPIN) 0.5 MG tablet Take 0.5 mg by mouth 3 (three) times daily.   Yes [provider]  fluticasone (FLONASE) 50 MCG/ACT nasal spray Place 2 sprays into both nostrils daily.   Yes [provider]  guanFACINE (TENEX) 1 MG tablet Place 1 mg into feeding tube at bedtime.    Yes [provider]  levOCARNitine (CARNITOR) 1 GM/10ML solution Take 500 mg by mouth 2 (two)  times daily.   Yes [provider]  montelukast (SINGULAIR) 10 MG tablet Take 10 mg by mouth at bedtime.   Yes [provider]  OXcarbazepine (TRILEPTAL) 300 MG/5ML suspension Take 600 mg by mouth 2 (two) times daily.    Yes [provider]  topiramate (TOPAMAX) 200 MG tablet Place 200 mg into feeding tube 2 (two) times daily.    Yes [provider]  valproic acid (DEPAKENE) 250 MG/5ML solution Take 10 mLs by mouth 2 (two) times daily. 03/24/20  Yes [provider]  Vitamin D,  Ergocalciferol, (DRISDOL) 50000 units CAPS capsule Take 1 capsule (50,000 Units total) by mouth every 7 (seven) days. 02/19/17  Yes Shaune Pollack, MD    Physical Exam: Vitals:   04/22/20 0700 04/22/20 0805 04/22/20 0844 04/22/20 0851  BP: (!) 98/57 (!) 85/47  (!) 99/55  Pulse: 100 87  79  Resp: (!) 25 17  19   Temp:   98.4 F (36.9 C)   TempSrc:   Rectal   SpO2: 99% 99%  97%  Weight:      Height:       General: Not in acute distress HEENT:       Eyes: PERRL, EOMI, no scleral icterus.       ENT: No discharge from the ears and nose       Neck: No JVD, no bruit, no mass felt. Heme: No neck lymph node enlargement. Cardiac: S1/S2, RRR, No murmurs, No gallops or rubs. Respiratory: Has decreased air movement bilaterally GI: Soft, nondistended, nontender, no organomegaly, BS present. GU: No hematuria Ext: No pitting leg edema bilaterally. 1+DP/PT pulse bilaterally. Musculoskeletal: No joint deformities, No joint redness or warmth, no limitation of ROM in spin. Skin: No rashes.  Neuro: Alert, nonverbal, cranial nerves II-XII grossly intact, moves all extremities normally.  Psych: Patient is not psychotic, no suicidal or hemocidal ideation.  Labs on Admission: I have personally reviewed following labs and imaging studies  CBC: Recent Labs  Lab 04/22/20 0604  WBC 10.8*  NEUTROABS 8.0*  HGB 12.8*  HCT 37.8*  MCV 90.2  PLT 148*   Basic Metabolic Panel: Recent Labs  Lab 04/22/20 0604  NA 140  K 3.6  CL 110  CO2 24  GLUCOSE 84  BUN 22*  CREATININE 0.40*  CALCIUM 9.0   GFR: Estimated Creatinine Clearance: 126.9 mL/min (A) (by C-G formula based on SCr of 0.4 mg/dL (L)). Liver Function Tests: Recent Labs  Lab 04/22/20 0604  AST 19  ALT 13  ALKPHOS 60  BILITOT 0.6  PROT 6.8  ALBUMIN 3.8   No results for input(s): LIPASE, AMYLASE in the last 168 hours. No results for input(s): AMMONIA in the last 168 hours. Coagulation Profile: No results for input(s): INR, PROTIME in  the last 168 hours. Cardiac Enzymes: No results for input(s): CKTOTAL, CKMB, CKMBINDEX, TROPONINI in the last 168 hours. BNP (last 3 results) No results for input(s): PROBNP in the last 8760 hours. HbA1C: No results for input(s): HGBA1C in the last 72 hours. CBG: No results for input(s): GLUCAP in the last 168 hours. Lipid Profile: No results for input(s): CHOL, HDL, LDLCALC, TRIG, CHOLHDL, LDLDIRECT in the last 72 hours. Thyroid Function Tests: No results for input(s): TSH, T4TOTAL, FREET4, T3FREE, THYROIDAB in the last 72 hours. Anemia Panel: No results for input(s): VITAMINB12, FOLATE, FERRITIN, TIBC, IRON, RETICCTPCT in the last 72 hours. Urine analysis:    Component Value Date/Time   COLORURINE YELLOW (A) 04/22/2020 04/24/2020  APPEARANCEUR HAZY (A) 04/22/2020 0716   APPEARANCEUR Clear 12/27/2012 0012   LABSPEC 1.021 04/22/2020 0716   LABSPEC 1.017 12/27/2012 0012   PHURINE 7.0 04/22/2020 0716   GLUCOSEU NEGATIVE 04/22/2020 0716   GLUCOSEU Negative 12/27/2012 0012   HGBUR NEGATIVE 04/22/2020 0716   BILIRUBINUR NEGATIVE 04/22/2020 0716   BILIRUBINUR Negative 12/27/2012 0012   KETONESUR NEGATIVE 04/22/2020 0716   PROTEINUR NEGATIVE 04/22/2020 0716   NITRITE NEGATIVE 04/22/2020 0716   LEUKOCYTESUR NEGATIVE 04/22/2020 0716   LEUKOCYTESUR Negative 12/27/2012 0012   Sepsis Labs: @LABRCNTIP (procalcitonin:4,lacticidven:4) ) Recent Results (from the past 240 hour(s))  Resp Panel by RT-PCR (Flu A&B, Covid) Nasopharyngeal Swab     Status: None   Collection Time: 04/22/20  7:05 AM   Specimen: Nasopharyngeal Swab; Nasopharyngeal(NP) swabs in vial transport medium  Result Value Ref Range Status   SARS Coronavirus 2 by RT PCR NEGATIVE NEGATIVE Final    Comment: (NOTE) SARS-CoV-2 target nucleic acids are NOT DETECTED.  The SARS-CoV-2 RNA is generally detectable in upper respiratory specimens during the acute phase of infection. The lowest concentration of SARS-CoV-2 viral copies this  assay can detect is 138 copies/mL. A negative result does not preclude SARS-Cov-2 infection and should not be used as the sole basis for treatment or other patient management decisions. A negative result may occur with  improper specimen collection/handling, submission of specimen other than nasopharyngeal swab, presence of viral mutation(s) within the areas targeted by this assay, and inadequate number of viral copies(<138 copies/mL). A negative result must be combined with clinical observations, patient history, and epidemiological information. The expected result is Negative.  Fact Sheet for Patients:  BloggerCourse.comhttps://www.fda.gov/media/152166/download  Fact Sheet for Healthcare Providers:  SeriousBroker.ithttps://www.fda.gov/media/152162/download  This test is no t yet approved or cleared by the [place] FDA and  has been authorized for detection and/or diagnosis of SARS-CoV-2 by FDA under an Emergency Use Authorization (EUA). This EUA will remain  in effect (meaning this test can be used) for the duration of the COVID-19 declaration under Section 564(b)(1) of the Act, 21 U.S.C.section 360bbb-3(b)(1), unless the authorization is terminated  or revoked sooner.       Influenza A by PCR NEGATIVE NEGATIVE Final   Influenza B by PCR NEGATIVE NEGATIVE Final    Comment: (NOTE) The Xpert Xpress SARS-CoV-2/FLU/RSV plus assay is intended as an aid in the diagnosis of influenza from Nasopharyngeal swab specimens and should not be used as a sole basis for treatment. Nasal washings and aspirates are unacceptable for Xpert Xpress SARS-CoV-2/FLU/RSV testing.  Fact Sheet for Patients: BloggerCourse.comhttps://www.fda.gov/media/152166/download  Fact Sheet for Healthcare Providers: SeriousBroker.ithttps://www.fda.gov/media/152162/download  This test is not yet approved or cleared by the [place] FDA and has been authorized for detection and/or diagnosis of SARS-CoV-2 by FDA under an Emergency Use Authorization (EUA). This EUA will  remain in effect (meaning this test can be used) for the duration of the COVID-19 declaration under Section 564(b)(1) of the Act, 21 U.S.C. section 360bbb-3(b)(1), unless the authorization is terminated or revoked.  Performed at [facility], [address] [place], [place] [address]      Radiological Exams on Admission: DG Chest Portable 1 View  Result Date: 04/22/2020 CLINICAL DATA:  Aspiration, seizure EXAM: PORTABLE CHEST 1 VIEW COMPARISON:  02/10/2017 FINDINGS: Mild left lower lobe opacity, possibly reflecting aspiration/pneumonia in this clinical setting, although atelectasis is also possible. Right lung is clear. No pleural effusion or pneumothorax. The heart is normal in size. Stimulator in the left neck. IMPRESSION: Mild left lower  lobe opacity, possibly reflecting aspiration/pneumonia in this clinical setting, although atelectasis is also possible. Electronically Signed   By: Charline Bills M.D.   On: 04/22/2020 06:20     EKG:  Not done in ED, will get one.   Assessment/Plan Principal Problem:   Aspiration pneumonia (HCC) Active Problems:   Epilepsy (HCC)   Aspiration pneumonia (HCC): No oxygen desaturation.  Patient has a mild leukocytosis with WBC 10.8, fever of 100.1, tachycardia with heart rate of 118, does not meet criteria for sepsis.  Lactic acid is normal.  Blood pressures are soft.  Per parents, his blood pressure is normally soft. - Will admit to med-surg bed as inpt - IV Unasyn - Mucinex for cough  - Bronchodilators - Follow up blood culture x2, sputum culture  - will get Procalcitonin and trend lactic acid level per sepsis protocol - IVF: 3L of LR bolus, followed by 75 mL per hour   Epilepsy (HCC): History recurrent seizures most likely due to not taking seizure medications. -Seizure precaution -When necessary Ativan for seizure -Continue Home medications:  clobazam, klonopin, topamax, trileptal, depakene  -pt is on levocarnitine -SLP due  to chocking episode       DVT ppx: SQ Lovenox Code Status: Full code Family Communication:   Yes, patient's  patents  at bed side Disposition Plan:  Anticipate discharge back to previous environment Consults called:  none Admission status and Level of care: Med-Surg:  as inpt     Status is: Inpatient  Remains inpatient appropriate because:Inpatient level of care appropriate due to severity of illness   Dispo: The patient is from: Home              Anticipated d/c is to: Home              Patient currently is not medically stable to d/c.   Difficult to place patient No          Date of Service 04/22/2020    Lorretta Harp [facility]   If 7PM-7AM, please contact night-coverage [email or web address] 04/22/2020, 9:25 AM

## 2020-04-22 NOTE — Evaluation (Signed)
Clinical/Bedside Swallow Evaluation Patient Details  Name: Noah Marshall MRN: [id number] Date of Birth: [birth year]  Today's Date: 04/22/2020 Time: SLP Start Time (ACUTE ONLY): 1210 SLP Stop Time (ACUTE ONLY): 1310 SLP Time Calculation (min) (ACUTE ONLY): 60 min  Past Medical History:  Past Medical History:  Diagnosis Date  . Aspiration pneumonia (HCC)   . Autism   . Epilepsy (HCC)   . Epilepsy with status epilepticus (HCC)   . Intracranial arachnoid cysts   . Pes planovalgus   . Pneumonia 09/26/2015  . Premature ventricular contractions   . Respiratory failure [facility])    Past Surgical History:  Past Surgical History:  Procedure Laterality Date  . FOREIGN BODY REMOVAL N/A 03/08/2015   Procedure: FOREIGN BODY REMOVAL;  Surgeon: Elnita Maxwell, MD;  Location[facility] ENDOSCOPY;  Service: Endoscopy;  Laterality: N/A;  . IMPLANTATION VAGAL NERVE STIMULATOR  2011  . REPLACE / REVISE VAGAL NERVE STIMULATOR     HPI:  [name] is a 25 y.o. male with medical history significant of autism, nonverbal status, epilepsy, PVC, intracranial arachnoid cyst, aspiration pneumonia, who presents with seizure.  Per his parents at the bedside, pt had a choking episode last night. They were concerned about giving him his nightly medications.  Therefore they did not give him any of his seizure medications last night. Pt is on clobazam, klonopin, topamax, trileptal, depakene for seizure. This morning he had a generalized tonic-clonic seizure which lasted about 30 to 45 seconds.  Per report, patient was initially postictal status, but mental status improved to normal baseline when in ED.  CXR: mild left lower lobe opacity, possibly reflecting  aspiration/pneumonia although atelectasis is also possible.   Assessment / Plan / Recommendation Clinical Impression  Pt has Baseline h/o oropharyngeal phase Dysphagia per previous MBSS and Parents present secondary to his Baseline Medical status of Autism; as well as  Epilepsy with status epilepticus, intracranial arachnoid cysts. Pt is nonverbal at Baseline and does not consistently follow instructions/cues. This can impact his overall awareness/engagement and safety during po tasks which increases risk for aspiration, choking. Pt's risk for aspiration is present but can be reduced when following general aspiration precautions, using a modified diet consistency, and when given feeding assistance. He required Mod+ tactile/verbal/ visual cues for orientation to bolus presentation during feeding support. Pt consumed trials of Nectar liquids via TSP and pinched straw then purees w/ No immediate, overt clinical s/s of aspiration noted; no cough, and no decline in respiratory status during/post trials. Trials of Honey consistency liquids appropriate as well. Oral phase transfer and immediate swallows were noted. Oral phase was adequate for bolus management and oral clearing of the boluses given. Actually, oral phase time was Quick -- Parents report he can eat/drink Impulsively at home. This can increase risk for choking. OM Exam was cursory/observation, but No unilateral weakness around the mouth noted.  D/t pt's Baseline functional swallowing status, Dysphagia, and risk for aspiration, recommend continue the dysphagia level 1(puree) w/ Honey consistency liquids as at his Baseline but w/ consideration of Nectar liquids w/ strict monitoring during presentation/sips if they choose. Discussed w/ Parents that the Nectar consistency liquids can aid in maintaining Hydration better for most. As pt appeared to adequately tolerate this consistency w/ trials at bedside, a MBBS would be recommended to objectively assess swallowing and safety next. Parents will consider and let this SLP know if they decide to f/u w/ this. Discussed that swallowing presentation can change throughout the day and if pt  is even distracted -- these issues can impact his focus during oral intake/swallowing. It could  also impact need for a certain consistency of liquid at that moment: Nectar vs Honey consistency liquid for safer swallowing. Discussed Feeding strategy of Spoon feeding w/ Nectar liquids to aid safer swallowing d/t small bolus size d/t pt's Impulsivity w/ oral intake. Parents agreed. Recommend aspiration precautions; reduce Distractions during meals and only give po's when pt is attentive. Check for oral clearing post bites/meals. Pills Crushed in Puree for safer swallowing. Support w/ feeding at meals -- monitor State. NSG updated. Precautions posted in room. ST services can be reconsulted if any new issues noted while admitted. Time spent w/ Parents going over the Aspiration precautions, diet consistency, and ordering of thickened liquids. Handouts and thickened liquids given. SLP Visit Diagnosis: Dysphagia, oropharyngeal phase (R13.12) (baseline)    Aspiration Risk  Moderate aspiration risk;Risk for inadequate nutrition/hydration    Diet Recommendation  Dysphagia level 1 (puree) w/ gravies added to moisten; Honey consistency liquids. Aspiration precautions. Feeding support and monitoring at all meals. Monitoring of State and pt's focus during oral intake. Check for oral clearing during/post meals.  Medication Administration: Crushed with puree (for safer swallowing)    Other  Recommendations Recommended Consults:  (Dietician f/u) Oral Care Recommendations: Oral care BID;Oral care before and after PO;Staff/trained caregiver to provide oral care Other Recommendations: Order thickener from pharmacy;Have oral suction available;Remove water pitcher;Prohibited food (jello, ice cream, thin soups)   Follow up Recommendations None (home w/ parents)      Frequency and Duration  (n/s)   (n/a)       Prognosis Prognosis for Safe Diet Advancement: Good (w/ diet modification, precautions) Barriers to Reach Goals: Cognitive deficits;Time post onset;Severity of deficits      Swallow Study   General  Date of Onset: 04/22/20 HPI: Noah Marshall is a 25 y.o. male with medical history significant of autism, nonverbal status, epilepsy, PVC, intracranial arachnoid cyst, aspiration pneumonia, who presents with seizure.  Per his parents at the bedside, pt had a choking episode last night. They were concerned about giving him his nightly medications.  Therefore they did not give him any of his seizure medications last night. Pt is on clobazam, klonopin, topamax, trileptal, depakene for seizure. This morning he had a generalized tonic-clonic seizure which lasted about 30 to 45 seconds.  Per report, patient was initially postictal status, but mental status improved to normal baseline when in ED.  CXR: mild left lower lobe opacity, possibly reflecting  aspiration/pneumonia although atelectasis is also possible. Type of Study: Bedside Swallow Evaluation Previous Swallow Assessment: mbss ~2+ years ago; PEG placement ~5 years ago Diet Prior to this Study: Dysphagia 1 (puree);Honey-thick liquids Temperature Spikes Noted: No (wbc 10.8) Respiratory Status: Room air History of Recent Intubation: No Behavior/Cognition: Alert;Cooperative;Pleasant mood;Requires cueing;Doesn't follow directions;Distractible (nonverbal) Oral Cavity Assessment: Within Functional Limits Oral Care Completed by SLP: Recent completion by staff Oral Cavity - Dentition: Adequate natural dentition Vision:  (n/a) Self-Feeding Abilities: Total assist Patient Positioning: Upright in bed;Postural control adequate for testing (more on side) Baseline Vocal Quality:  (nonverbal) Volitional Cough: Cognitively unable to elicit Volitional Swallow: Unable to elicit    Oral/Motor/Sensory Function Overall Oral Motor/Sensory Function: Within functional limits (during movements; no unilateral weakness)   Ice Chips Ice chips: Not tested   Thin Liquid Thin Liquid: Not tested    Nectar Thick Nectar Thick Liquid: Within functional limits Presentation:  Spoon;Straw (pinched: 4 ozs)   Honey Thick Honey  Thick Liquid: Within functional limits (per family)   Puree Puree: Within functional limits Presentation: Spoon (fed; ~3 ozs)   Solid     Solid: Not tested        Jerilynn Som, MS, CCC-SLP Speech Language Pathologist Rehab Services [phone] Rebekha Diveley 04/22/2020,2:33 PM

## 2020-04-22 NOTE — Progress Notes (Signed)
CODE SEPSIS - PHARMACY COMMUNICATION  **Broad Spectrum Antibiotics should be administered within 1 hour of Sepsis diagnosis**  Time Code Sepsis Called/Page Received: 0625  Antibiotics Ordered: Unasyn  Time of 1st antibiotic administration:  Additional action taken by pharmacy:  If necessary, Name of Provider/Nurse Contacted:   Code Sepsis passed off to day-shift RPh Otelia Sergeant, PharmD, [facility] 04/22/2020 6:58 AM

## 2020-04-22 NOTE — ED Triage Notes (Addendum)
Pt from home via ems, choked dinner last night, family concern for aspiration, hx same. Did not get night meds. Seizure lasted 1 min On: clobazam, klonopin, tenex, carnitor, topamax, trileptal, depakene

## 2020-04-22 NOTE — ED Notes (Signed)
Notified Dr. Clyde Lundborg, MD with the floor RN's concern for the pt's BP. Rechecked BP in a different location. Per parents at bedside, pt's BP normally runs this low. See new orders.

## 2020-04-22 NOTE — Progress Notes (Signed)
Pharmacy Antibiotic Note  Noah Marshall is a 25 y.o. male admitted on 04/22/2020 with aspiration pneumonia.  Pharmacy has been consulted for Unasyn dosing.  Plan:  Start Unasyn 3 grams IV every 6 hours  Height: 5\' 7"  (170.2 cm) Weight: 63 kg (138 lb 14.2 oz) IBW/kg (Calculated) : 66.1  Temp (24hrs), Avg:100.1 F (37.8 C), Min:100.1 F (37.8 C), Max:100.1 F (37.8 C)  Recent Labs  Lab 04/22/20 0604  WBC 10.8*  CREATININE 0.40*    Estimated Creatinine Clearance: 126.9 mL/min (A) (by C-G formula based on SCr of 0.4 mg/dL (L)).    Allergies  Allergen Reactions  . Lactose Intolerance (Gi) Diarrhea  . Peanut-Containing Drug Products Hives    Antimicrobials this admission: Unasyn 3/26 >>   Microbiology results: 3/26 BCx: pending  3/26 Sputum: pending   Thank you for allowing pharmacy to be a part of this patient's care.  4/26 04/22/2020 7:43 AM

## 2020-04-22 NOTE — ED Provider Notes (Signed)
[facility] Emergency Department Provider Note  ____________________________________________  Time seen: Approximately 5:47 AM  I have reviewed the triage vital signs and the nursing notes.   HISTORY  Chief Complaint Seizures  Level 5 caveat:  Portions of the history and physical were unable to be obtained due to autism and non verbal   HPI Noah Marshall is a 25 y.o. male with a history of autism, epilepsy, aspiration who presents from home for a seizure.  According to EMS, patient had a choking episode last night and family was concerned about giving him his nightly medications.  Therefore they did not give him any of his seizure medications last night.  This morning he had a generalized tonic-clonic seizure lasted about a minute.  When EMS arrived patient was postictal.  He is currently back to baseline.  Patient is nonverbal.  He has had no difficulty breathing or cough. Family not present at this time.   Past Medical History:  Diagnosis Date  . Aspiration pneumonia (HCC)   . Autism   . Epilepsy (HCC)   . Epilepsy with status epilepticus (HCC)   . Intracranial arachnoid cysts   . Pes planovalgus   . Pneumonia 09/26/2015  . Premature ventricular contractions   . Respiratory failure [facility])     Patient Active Problem List   Diagnosis Date Noted  . Hyponatremia 09/26/2015  . Epilepsy (HCC) 09/13/2015    Past Surgical History:  Procedure Laterality Date  . FOREIGN BODY REMOVAL N/A 03/08/2015   Procedure: FOREIGN BODY REMOVAL;  Surgeon: Elnita Maxwell, MD;  Location: [facility] ENDOSCOPY;  Service: Endoscopy;  Laterality: N/A;  . IMPLANTATION VAGAL NERVE STIMULATOR  2011  . REPLACE / REVISE VAGAL NERVE STIMULATOR      Prior to Admission medications   Medication Sig Start Date End Date Taking? Authorizing Provider  cetirizine HCl (ZYRTEC) 1 MG/ML solution Take 15 mg by mouth daily.    [provider]  cloBAZam (ONFI) 2.5 MG/ML solution  Place 15 mg into feeding tube 2 (two) times daily.     [provider]  clonazePAM (KLONOPIN) 0.5 MG tablet Take 0.5 mg by mouth 3 (three) times daily.     [provider]  fluticasone (FLONASE) 50 MCG/ACT nasal spray Place 2 sprays into both nostrils daily.    [provider]  guanFACINE (TENEX) 1 MG tablet Place 1 mg into feeding tube at bedtime.     [provider]  levOCARNitine (CARNITOR) 1 GM/10ML solution Take 500 mg by mouth 2 (two) times daily.    [provider]  montelukast (SINGULAIR) 10 MG tablet Take 10 mg by mouth at bedtime.    [provider]  OXcarbazepine (TRILEPTAL) 300 MG/5ML suspension Take 600 mg by mouth 2 (two) times daily.     [provider]  topiramate (TOPAMAX) 200 MG tablet Place 200 mg into feeding tube 2 (two) times daily.     [provider]  valproic acid (DEPAKENE) 250 MG/5ML syrup Take 500 mg by mouth 2 (two) times daily.    [provider]  Vitamin D, Ergocalciferol, (DRISDOL) 50000 units CAPS capsule Take 1 capsule (50,000 Units total) by mouth every 7 (seven) days. 02/19/17   Shaune Pollack, MD    Allergies Lactose intolerance (gi) and Peanut-containing drug products  Family History  Problem Relation Age of Onset  . Diabetes Mellitus II Father   . Hypertension Father     Social History Social History   Tobacco  Use  . Smoking status: Never Smoker  . Smokeless tobacco: Never Used  Substance Use Topics  . Alcohol use: No  . Drug use: No    Review of Systems  Constitutional: Negative for fever. + seizure Respiratory: Negative for shortness of breath. Gastrointestinal: Negative for vomiting or diarrhea.  ____________________________________________   PHYSICAL EXAM:  VITAL SIGNS: ED Triage Vitals [04/22/20 0539]  Enc Vitals Group     BP      Pulse      Resp      Temp      Temp src      SpO2      Weight 138 lb 14.2 oz (63 kg)     Height 5\' 7"  (1.702 m)      Head Circumference      Peak Flow      Pain Score 0     Pain Loc      Pain Edu?      Excl. in GC?     Constitutional: Awake, non verbal, spatic HEENT:      Head: Normocephalic and atraumatic.         Eyes: Conjunctivae are normal. Sclera is non-icteric.       Mouth/Throat: Mucous membranes are moist.       Neck: Supple with no signs of meningismus. Cardiovascular: Regular rate and rhythm. No murmurs, gallops, or rubs. 2+ symmetrical distal pulses are present in all extremities. No JVD. Respiratory: Normal respiratory effort. Lungs are clear to auscultation bilaterally.  Gastrointestinal: Soft, non tender, and non distended. Musculoskeletal:  No edema, cyanosis, or erythema of extremities. Neurologic: Face is symmetric. Moving all extremities. No gross focal neurologic deficits are appreciated. Skin: Skin is warm, dry and intact. No rash noted. Psychiatric: Mood and affect are normal. Speech and behavior are normal.  ____________________________________________   LABS (all labs ordered are listed, but only abnormal results are displayed)  Labs Reviewed  CBC WITH DIFFERENTIAL/PLATELET - Abnormal; Notable for the following components:      Result Value   WBC 10.8 (*)    RBC 4.19 (*)    Hemoglobin 12.8 (*)    HCT 37.8 (*)    Platelets 148 (*)    Neutro Abs 8.0 (*)    Monocytes Absolute 1.2 (*)    All other components within normal limits  CULTURE, BLOOD (ROUTINE X 2)  CULTURE, BLOOD (ROUTINE X 2)  RESP PANEL BY RT-PCR (FLU A&B, COVID) ARPGX2  COMPREHENSIVE METABOLIC PANEL  LACTIC ACID, PLASMA  LACTIC ACID, PLASMA   ____________________________________________  EKG  none  ____________________________________________  RADIOLOGY  I have personally reviewed the images performed during this visit and I agree with the Radiologist's read.   Interpretation by Radiologist:  DG Chest Portable 1 View  Result Date: 04/22/2020 CLINICAL DATA:  Aspiration, seizure EXAM:  PORTABLE CHEST 1 VIEW COMPARISON:  02/10/2017 FINDINGS: Mild left lower lobe opacity, possibly reflecting aspiration/pneumonia in this clinical setting, although atelectasis is also possible. Right lung is clear. No pleural effusion or pneumothorax. The heart is normal in size. Stimulator in the left neck. IMPRESSION: Mild left lower lobe opacity, possibly reflecting aspiration/pneumonia in this clinical setting, although atelectasis is also possible. Electronically Signed   By: 02/12/2017 M.D.   On: 04/22/2020 06:20      ____________________________________________   PROCEDURES  Procedure(s) performed:yes .1-3 Lead EKG Interpretation Performed by: 04/24/2020, MD Authorized by: Nita Sickle, MD     Interpretation: non-specific     ECG rate assessment:  normal     Rhythm: sinus rhythm     Ectopy: none     Conduction: normal     Critical Care performed: yes  CRITICAL CARE Performed by: Nita Sickle  ?  Total critical care time: 35 min  Critical care time was exclusive of separately billable procedures and treating other patients.  Critical care was necessary to treat or prevent imminent or life-threatening deterioration.  Critical care was time spent personally by me on the following activities: development of treatment plan with patient and/or surrogate as well as nursing, discussions with consultants, evaluation of patient's response to treatment, examination of patient, obtaining history from patient or surrogate, ordering and performing treatments and interventions, ordering and review of laboratory studies, ordering and review of radiographic studies, pulse oximetry and re-evaluation of patient's condition.  ____________________________________________   INITIAL IMPRESSION / ASSESSMENT AND PLAN / ED COURSE  25 y.o. male with a history of autism, epilepsy, aspiration who presents from home for a seizure.  Patient did not receive his evening dose of  antiseizure medication last night due to a choking episode and concern for possible aspiration.  At this time he is back to his baseline, no longer postictal, he has no respiratory distress, normal work of breathing, normal sats, lungs are clear to auscultation.  Patient is supposed to receive all these medications orally in the liquid format.  We will give the morning doses here in the emergency room.  We will check blood work for any electrolyte derangements.  We will do a chest x-ray for any signs of aspiration pneumonia/pneumonitis. Old medical records reviewed. Seizure precautions initiated.  Patient placed on telemetry for close monitoring  _________________________ 6:26 AM on 04/22/2020 -----------------------------------------  Patient with a rectal temperature of 100.44F, tachycardic with a pulse of 109, labs showing leukocytosis and a chest x-ray concerning for aspiration pneumonia.  Patient meets sepsis criteria.  Will cover with Unasyn and initiate sepsis protocol.  Will admit to the hospitalist service     _____________________________________________ Please note:  Patient was evaluated in Emergency Department today for the symptoms described in the history of present illness. Patient was evaluated in the context of the global COVID-19 pandemic, which necessitated consideration that the patient might be at risk for infection with the SARS-CoV-2 virus that causes COVID-19. Institutional protocols and algorithms that pertain to the evaluation of patients at risk for COVID-19 are in a state of rapid change based on information released by regulatory bodies including the CDC and federal and state organizations. These policies and algorithms were followed during the patient's care in the ED.  Some ED evaluations and interventions may be delayed as a result of limited staffing during the pandemic.   [place] Controlled Substance Database was reviewed by  me. ____________________________________________   FINAL CLINICAL IMPRESSION(S) / ED DIAGNOSES   Final diagnoses:  Seizure (HCC)  Sepsis without acute organ dysfunction, due to unspecified organism (HCC)  Aspiration pneumonia due to gastric secretions, unspecified laterality, unspecified part of lung (HCC)      NEW MEDICATIONS STARTED DURING THIS VISIT:  ED Discharge Orders    None       Note:  This document was prepared using Dragon voice recognition software and may include unintentional dictation errors.    Nita Sickle, MD 04/22/20 [phone]

## 2020-04-22 NOTE — Sepsis Progress Note (Signed)
Following for Code Sepsis  

## 2020-04-22 NOTE — Progress Notes (Signed)
Patient is autistic and nonverbal. Mother and/or father is in room with patient always to make decisions for patient.

## 2020-04-22 NOTE — ED Notes (Signed)
Dr Niu at bedside 

## 2020-04-22 NOTE — ED Notes (Signed)
Lab at bedside to recollect blood cultures.

## 2020-04-23 DIAGNOSIS — D696 Thrombocytopenia, unspecified: Secondary | ICD-10-CM

## 2020-04-23 DIAGNOSIS — G40009 Localization-related (focal) (partial) idiopathic epilepsy and epileptic syndromes with seizures of localized onset, not intractable, without status epilepticus: Secondary | ICD-10-CM

## 2020-04-23 LAB — BLOOD CULTURE ID PANEL (REFLEXED) - BCID2

## 2020-04-23 LAB — CBC
HCT: 30.4 % — ABNORMAL LOW (ref 39.0–52.0)
Hemoglobin: 10.2 g/dL — ABNORMAL LOW (ref 13.0–17.0)
MCH: 30.6 pg (ref 26.0–34.0)
MCHC: 33.6 g/dL (ref 30.0–36.0)
MCV: 91.3 fL (ref 80.0–100.0)
Platelets: 141 10*3/uL — ABNORMAL LOW (ref 150–400)
RBC: 3.33 MIL/uL — ABNORMAL LOW (ref 4.22–5.81)
RDW: 13.5 % (ref 11.5–15.5)
WBC: 9.7 10*3/uL (ref 4.0–10.5)
nRBC: 0 % (ref 0.0–0.2)

## 2020-04-23 LAB — BASIC METABOLIC PANEL
Anion gap: 7 (ref 5–15)
BUN: 16 mg/dL (ref 6–20)
CO2: 19 mmol/L — ABNORMAL LOW (ref 22–32)
Calcium: 8.4 mg/dL — ABNORMAL LOW (ref 8.9–10.3)
Chloride: 108 mmol/L (ref 98–111)
Creatinine, Ser: 0.48 mg/dL — ABNORMAL LOW (ref 0.61–1.24)
GFR, Estimated: >60 mL/min (ref >60)
Glucose, Bld: 84 mg/dL (ref 70–99)
Potassium: 3.7 mmol/L (ref 3.5–5.1)
Sodium: 134 mmol/L — ABNORMAL LOW (ref 135–145)

## 2020-04-23 LAB — PROCALCITONIN: Procalcitonin: 0.73 ng/mL

## 2020-04-23 NOTE — Progress Notes (Addendum)
PHARMACY - PHYSICIAN COMMUNICATION CRITICAL VALUE ALERT - BLOOD CULTURE IDENTIFICATION (BCID)  Preliminary BCID results- 1 (aerobic) of 4 bottles with GPC. Pt currently on Unasyn.   Name of physician (or Provider) Contacted: Cliffton Asters  Changes to prescribed antibiotics required: None at this time  Otelia Sergeant, PharmD, [facility] 04/23/2020 3:47 AM  Updated BCID:  1 bottle positive w/ Staph Epi, no resistance. Continue w/ Unasyn at this time, per NP.  Otelia Sergeant, PharmD, [facility] 04/23/2020 5:31 AM

## 2020-04-23 NOTE — Progress Notes (Signed)
PROGRESS NOTE    Noah Marshall  LGX:[id number] DOB: 26-Oct-1995 DOA: 04/22/2020 PCP: Malva Limes, MD   Assessment & Plan:   Principal Problem:   Aspiration pneumonia [facility]) Active Problems:   Epilepsy (HCC)   Aspiration pneumonia:  continue on IV unasyn and bronchodilators. Encourage incentive spirometry. Pro-cal 0.69. Continue on IVFs  Unlikely bacteremia: 1 of 2 blood cx growing staph epidermidis, likely containment   Epilepsy: hx of recurrent seizures most likely due to not taking seizure medications. Continue on home dose of clobazam, klonopin, topamax, trileptal, levocarnitine. IV ativan prn. Neuro consulted   Thrombocytopenia: etiology unclear. Will continue to monitor   Normocytic anemia: no need for a transfusion currently   Autism: non-verbal status. Continue w/ supportive care    DVT prophylaxis: lovenox  Code Status: full  Family Communication: discussed pt's care w/ pt's mother at bedside and answered her questions  Disposition Plan: likely d/c back home  Level of care: Med-Surg   Status is: Inpatient  Remains inpatient appropriate because:IV treatments appropriate due to intensity of illness or inability to take PO and Inpatient level of care appropriate due to severity of illness   Dispo: The patient is from: Home              Anticipated d/c is to: Home              Patient currently is not medically stable to d/c.   Difficult to place patient No    Consultants:   neuro   Procedures:    Antimicrobials:    Subjective: Pt is non-verbal and is unable to verbalize complaints   Objective: Vitals:   04/22/20 1918 04/23/20 0047 04/23/20 0625 04/23/20 0736  BP: 109/68 (!) 112/45 (!) 94/55 (!) 83/59  Pulse: 73 99 79 82  Resp: 16 16 16 16   Temp: 98.9 F (37.2 C) 99.1 F (37.3 C) 99.2 F (37.3 C) 98.5 F (36.9 C)  TempSrc: Oral Oral Oral Oral  SpO2: 99% 98% 99% 99%  Weight:      Height:        Intake/Output Summary (Last 24 hours)  at 04/23/2020 0742 Last data filed at 04/22/2020 1829 Gross per 24 hour  Intake 1180 ml  Output -  Net 1180 ml   Filed Weights   04/22/20 0539 04/22/20 0936  Weight: 63 kg 61.5 kg    Examination:  General exam: Appears calm and comfortable  Respiratory system: diminished breath sounds b/l  Cardiovascular system: S1 & S2+. No rubs, gallops or clicks Gastrointestinal system: Abdomen is nondistended, soft and nontender. Normal bowel sounds heard. Central nervous system: Alert and awake. Moves all 4 extremities  Psychiatry: Judgement and insight appear abnormal. Mood & affect appropriate.     Data Reviewed: I have personally reviewed following labs and imaging studies  CBC: Recent Labs  Lab 04/22/20 0604 04/23/20 0438  WBC 10.8* 9.7  NEUTROABS 8.0*  --   HGB 12.8* 10.2*  HCT 37.8* 30.4*  MCV 90.2 91.3  PLT 148* 141*   Basic Metabolic Panel: Recent Labs  Lab 04/22/20 0604 04/23/20 0438  NA 140 134*  K 3.6 3.7  CL 110 108  CO2 24 19*  GLUCOSE 84 84  BUN 22* 16  CREATININE 0.40* 0.48*  CALCIUM 9.0 8.4*   GFR: Estimated Creatinine Clearance: 123.9 mL/min (A) (by C-G formula based on SCr of 0.48 mg/dL (L)). Liver Function Tests: Recent Labs  Lab 04/22/20 0604  AST 19  ALT 13  ALKPHOS  60  BILITOT 0.6  PROT 6.8  ALBUMIN 3.8   No results for input(s): LIPASE, AMYLASE in the last 168 hours. No results for input(s): AMMONIA in the last 168 hours. Coagulation Profile: No results for input(s): INR, PROTIME in the last 168 hours. Cardiac Enzymes: No results for input(s): CKTOTAL, CKMB, CKMBINDEX, TROPONINI in the last 168 hours. BNP (last 3 results) No results for input(s): PROBNP in the last 8760 hours. HbA1C: No results for input(s): HGBA1C in the last 72 hours. CBG: No results for input(s): GLUCAP in the last 168 hours. Lipid Profile: No results for input(s): CHOL, HDL, LDLCALC, TRIG, CHOLHDL, LDLDIRECT in the last 72 hours. Thyroid Function Tests: No  results for input(s): TSH, T4TOTAL, FREET4, T3FREE, THYROIDAB in the last 72 hours. Anemia Panel: No results for input(s): VITAMINB12, FOLATE, FERRITIN, TIBC, IRON, RETICCTPCT in the last 72 hours. Sepsis Labs: Recent Labs  Lab 04/22/20 0710 04/22/20 0729  PROCALCITON  --  0.69  LATICACIDVEN 1.6  --     Recent Results (from the past 240 hour(s))  Resp Panel by RT-PCR (Flu A&B, Covid) Nasopharyngeal Swab     Status: None   Collection Time: 04/22/20  7:05 AM   Specimen: Nasopharyngeal Swab; Nasopharyngeal(NP) swabs in vial transport medium  Result Value Ref Range Status   SARS Coronavirus 2 by RT PCR NEGATIVE NEGATIVE Final    Comment: (NOTE) SARS-CoV-2 target nucleic acids are NOT DETECTED.  The SARS-CoV-2 RNA is generally detectable in upper respiratory specimens during the acute phase of infection. The lowest concentration of SARS-CoV-2 viral copies this assay can detect is 138 copies/mL. A negative result does not preclude SARS-Cov-2 infection and should not be used as the sole basis for treatment or other patient management decisions. A negative result may occur with  improper specimen collection/handling, submission of specimen other than nasopharyngeal swab, presence of viral mutation(s) within the areas targeted by this assay, and inadequate number of viral copies(<138 copies/mL). A negative result must be combined with clinical observations, patient history, and epidemiological information. The expected result is Negative.  Fact Sheet for Patients:  BloggerCourse.comhttps://www.fda.gov/media/152166/download  Fact Sheet for Healthcare Providers:  SeriousBroker.ithttps://www.fda.gov/media/152162/download  This test is no t yet approved or cleared by the [place] FDA and  has been authorized for detection and/or diagnosis of SARS-CoV-2 by FDA under an Emergency Use Authorization (EUA). This EUA will remain  in effect (meaning this test can be used) for the duration of the COVID-19 declaration  under Section 564(b)(1) of the Act, 21 U.S.C.section 360bbb-3(b)(1), unless the authorization is terminated  or revoked sooner.       Influenza A by PCR NEGATIVE NEGATIVE Final   Influenza B by PCR NEGATIVE NEGATIVE Final    Comment: (NOTE) The Xpert Xpress SARS-CoV-2/FLU/RSV plus assay is intended as an aid in the diagnosis of influenza from Nasopharyngeal swab specimens and should not be used as a sole basis for treatment. Nasal washings and aspirates are unacceptable for Xpert Xpress SARS-CoV-2/FLU/RSV testing.  Fact Sheet for Patients: BloggerCourse.comhttps://www.fda.gov/media/152166/download  Fact Sheet for Healthcare Providers: SeriousBroker.ithttps://www.fda.gov/media/152162/download  This test is not yet approved or cleared by the [place] FDA and has been authorized for detection and/or diagnosis of SARS-CoV-2 by FDA under an Emergency Use Authorization (EUA). This EUA will remain in effect (meaning this test can be used) for the duration of the COVID-19 declaration under Section 564(b)(1) of the Act, 21 U.S.C. section 360bbb-3(b)(1), unless the authorization is terminated or revoked.  Performed at [facility], [address] [place], [place] [address]   Culture, blood (Routine X 2) w Reflex to ID Panel     Status: None (Preliminary result)   Collection Time: 04/22/20  8:18 AM   Specimen: BLOOD  Result Value Ref Range Status   Specimen Description BLOOD LEFT ARM  Final   Special Requests   Final    BOTTLES DRAWN AEROBIC AND ANAEROBIC Blood Culture adequate volume   Culture   Final    NO GROWTH < 24 HOURS Performed at [facility], [address] [place], [place] [address]    Report Status PENDING  Incomplete  Culture, blood (Routine X 2) w Reflex to ID Panel     Status: None (Preliminary result)   Collection Time: 04/22/20  8:18 AM   Specimen: BLOOD  Result Value Ref Range Status   Specimen Description BLOOD LEFT HAND  Final   Special Requests   Final    BOTTLES  DRAWN AEROBIC AND ANAEROBIC Blood Culture adequate volume   Culture  Setup Time   Final    Organism ID to follow GRAM POSITIVE COCCI AEROBIC BOTTLE ONLY CRITICAL RESULT CALLED TO, READ BACK BY AND VERIFIED WITH: Performed at [facility], [address] [place], [place] [address]    Culture GRAM POSITIVE COCCI  Final   Report Status PENDING  Incomplete  Blood Culture ID Panel (Reflexed)     Status: Abnormal   Collection Time: 04/22/20  8:18 AM  Result Value Ref Range Status   Enterococcus faecalis NOT DETECTED NOT DETECTED Final   Enterococcus Faecium NOT DETECTED NOT DETECTED Final   Listeria monocytogenes NOT DETECTED NOT DETECTED Final   Staphylococcus species DETECTED (A) NOT DETECTED Final    Comment: CRITICAL RESULT CALLED TO, READ BACK BY AND VERIFIED WITH: NATHAN BELUE AT 0503 04/23/20 SDR    Staphylococcus aureus (BCID) NOT DETECTED NOT DETECTED Final   Staphylococcus epidermidis DETECTED (A) NOT DETECTED Final    Comment: CRITICAL RESULT CALLED TO, READ BACK BY AND VERIFIED WITH:  NATHAN BELUE AT 0503 04/23/20 SDR    Staphylococcus lugdunensis NOT DETECTED NOT DETECTED Final   Streptococcus species NOT DETECTED NOT DETECTED Final   Streptococcus agalactiae NOT DETECTED NOT DETECTED Final   Streptococcus pneumoniae NOT DETECTED NOT DETECTED Final   Streptococcus pyogenes NOT DETECTED NOT DETECTED Final   A.calcoaceticus-baumannii NOT DETECTED NOT DETECTED Final   Bacteroides fragilis NOT DETECTED NOT DETECTED Final   Enterobacterales NOT DETECTED NOT DETECTED Final   Enterobacter cloacae complex NOT DETECTED NOT DETECTED Final   Escherichia coli NOT DETECTED NOT DETECTED Final   Klebsiella aerogenes NOT DETECTED NOT DETECTED Final   Klebsiella oxytoca NOT DETECTED NOT DETECTED Final   Klebsiella pneumoniae NOT DETECTED NOT DETECTED Final   Proteus species NOT DETECTED NOT DETECTED Final   Salmonella species NOT DETECTED NOT DETECTED Final   Serratia  marcescens NOT DETECTED NOT DETECTED Final   Haemophilus influenzae NOT DETECTED NOT DETECTED Final   Neisseria meningitidis NOT DETECTED NOT DETECTED Final   Pseudomonas aeruginosa NOT DETECTED NOT DETECTED Final   Stenotrophomonas maltophilia NOT DETECTED NOT DETECTED Final   Candida albicans NOT DETECTED NOT DETECTED Final   Candida auris NOT DETECTED NOT DETECTED Final   Candida glabrata NOT DETECTED NOT DETECTED Final   Candida krusei NOT DETECTED NOT DETECTED Final   Candida parapsilosis NOT DETECTED NOT DETECTED Final   Candida tropicalis NOT DETECTED NOT DETECTED Final   Cryptococcus neoformans/gattii NOT DETECTED NOT DETECTED Final  Methicillin resistance mecA/C NOT DETECTED NOT DETECTED Final    Comment: Performed at [facility], [address] [place], [place] [address]         Radiology Studies: DG Chest Portable 1 View  Result Date: 04/22/2020 CLINICAL DATA:  Aspiration, seizure EXAM: PORTABLE CHEST 1 VIEW COMPARISON:  02/10/2017 FINDINGS: Mild left lower lobe opacity, possibly reflecting aspiration/pneumonia in this clinical setting, although atelectasis is also possible. Right lung is clear. No pleural effusion or pneumothorax. The heart is normal in size. Stimulator in the left neck. IMPRESSION: Mild left lower lobe opacity, possibly reflecting aspiration/pneumonia in this clinical setting, although atelectasis is also possible. Electronically Signed   By: Charline Bills M.D.   On: 04/22/2020 06:20        Scheduled Meds: . cetirizine HCl  15 mg Oral Daily  . cloBAZam  15 mg Oral BID  . clonazePAM  0.5 mg Oral TID  . enoxaparin (LOVENOX) injection  40 mg Subcutaneous Q24H  . fluticasone  2 spray Each Nare Daily  . levOCARNitine  500 mg Oral BID  . montelukast  10 mg Oral QHS  . OXcarbazepine  600 mg Oral BID  . topiramate  200 mg Oral BID  . valproic acid  500 mg Oral BID   Continuous Infusions: . ampicillin-sulbactam (UNASYN) IV 3 g (04/23/20  0543)  . levETIRAcetam       LOS: 1 day    Time spent: 31 mins    Charise Killian, MD [facility] Pager [phone]  If 7PM-7AM, please contact night-coverage 04/23/2020, 7:42 AM

## 2020-04-23 NOTE — Consult Note (Signed)
Neurology Consultation Reason for Consult: Seizures Referring Physician: Wilfred Lacy  CC: Seizures  History is obtained from: Mother  HPI: Noah Marshall is a 25 y.o. male with a history of autism, epilepsy who is nonverbal at baseline.  He typically has 2-3 seizures per month.  Unfortunately, two nights ago he had a choking episode, while he was eating, he choked on his food and subsequently his parents were concerned that he might choke when they gave him his medicine and therefore he missed a dose.  He had multiple seizures yesterday as well as yesterday the evening, none since then.  He has been restarted on his home medications.  His mother states that he will not be able to follow commands or answer any questions at baseline.   ROS:  Unable to obtain due to altered mental status.   Past Medical History:  Diagnosis Date  . Aspiration pneumonia (HCC)   . Autism   . Epilepsy (HCC)   . Epilepsy with status epilepticus (HCC)   . Intracranial arachnoid cysts   . Pes planovalgus   . Pneumonia 09/26/2015  . Premature ventricular contractions   . Respiratory failure (HCC)      Family History  Problem Relation Age of Onset  . Diabetes Mellitus II Father   . Hypertension Father      Social History:  reports that he has never smoked. He has never used smokeless tobacco. He reports that he does not drink alcohol and does not use drugs.   Exam: Current vital signs: BP 96/65 (BP Location: Left Arm)   Pulse 88   Temp 97.7 F (36.5 C) (Oral)   Resp 18   Ht 5\' 6"  (1.676 m)   Wt 61.5 kg   SpO2 100%   BMI 21.88 kg/m  Vital signs in last 24 hours: Temp:  [97.7 F (36.5 C)-99.2 F (37.3 C)] 97.7 F (36.5 C) (03/27 1136) Pulse Rate:  [73-99] 88 (03/27 1136) Resp:  [16-18] 18 (03/27 1136) BP: (83-112)/(45-68) 96/65 (03/27 1136) SpO2:  [97 %-100 %] 100 % (03/27 1136)   Physical Exam  Constitutional: No signs of malnourishment Psych: He is smiling throughout my  interview Eyes: No scleral injection HENT: No OP obstruction Cardiovascular: Normal rate and regular rhythm.  Respiratory: Effort normal, non-labored breathing GI: Soft.  No distension. There is no tenderness.  Skin: WDI  Neuro: Mental Status: Patient is awake, alert, he fixates and tracks the examiner, he does not follow any commands. Cranial Nerves: II: He does not reliably blink to threat, but he does track pupils are equal, round, and reactive to light.   III,IV, VI: EOMI without ptosis or diploplia.  V: Facial sensation is symmetric to temperature VII: Facial movement is symmetric.  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   Impression: 24 year old male with a history of autism and epilepsy, nonverbal and unable to follow commands at baseline who presents with increased seizure frequency in the setting of medication noncompliance.  He is back on his home medications, and seems to be doing well.  I would not make any changes  to his regimen at this time.  Recommendations: 1) no further recommendations unless he has further seizures, please call if further seizures occur.   Ritta Slot, MD [facility] [phone]  If 7pm- 7am, please page neurology on call as listed in [facility].

## 2020-04-24 ENCOUNTER — Other Ambulatory Visit: Payer: Self-pay | Admitting: Internal Medicine

## 2020-04-24 LAB — CBC
HCT: 29.9 % — ABNORMAL LOW (ref 39.0–52.0)
Hemoglobin: 10.4 g/dL — ABNORMAL LOW (ref 13.0–17.0)
MCH: 30.4 pg (ref 26.0–34.0)
MCHC: 34.8 g/dL (ref 30.0–36.0)
MCV: 87.4 fL (ref 80.0–100.0)
Platelets: 143 10*3/uL — ABNORMAL LOW (ref 150–400)
RBC: 3.42 MIL/uL — ABNORMAL LOW (ref 4.22–5.81)
RDW: 13.3 % (ref 11.5–15.5)
WBC: 7.4 10*3/uL (ref 4.0–10.5)
nRBC: 0 % (ref 0.0–0.2)

## 2020-04-24 LAB — CULTURE, BLOOD (ROUTINE X 2): Special Requests: ADEQUATE

## 2020-04-24 LAB — BASIC METABOLIC PANEL
Anion gap: 6 (ref 5–15)
BUN: 18 mg/dL (ref 6–20)
CO2: 22 mmol/L (ref 22–32)
Calcium: 8.6 mg/dL — ABNORMAL LOW (ref 8.9–10.3)
Chloride: 108 mmol/L (ref 98–111)
Creatinine, Ser: 0.37 mg/dL — ABNORMAL LOW (ref 0.61–1.24)
GFR, Estimated: >60 mL/min (ref >60)
Glucose, Bld: 86 mg/dL (ref 70–99)
Potassium: 3.4 mmol/L — ABNORMAL LOW (ref 3.5–5.1)
Sodium: 136 mmol/L (ref 135–145)

## 2020-04-24 LAB — LEGIONELLA PNEUMOPHILA SEROGP 1 UR AG: L. pneumophila Serogp 1 Ur Ag: NEGATIVE

## 2020-04-24 LAB — PROCALCITONIN: Procalcitonin: 0.5 ng/mL

## 2020-04-24 MED ORDER — AMOXICILLIN-POT CLAVULANATE 875-125 MG PO TABS: 1.0000 | ORAL_TABLET | Freq: Two times a day (BID) | ORAL | 0 refills | Status: DC

## 2020-04-24 MED ORDER — POTASSIUM CHLORIDE 20 MEQ PO PACK
20.0000 meq | PACK | Freq: Once | ORAL | Status: AC
  Administered 2020-04-24: 10:00:00 20 meq via ORAL
  Filled 2020-04-24: qty 1

## 2020-04-24 NOTE — Discharge Summary (Signed)
Physician Discharge Summary  Noah Marshall ZOX:[id number] DOB: 31-Jan-1995 DOA: 04/22/2020  PCP: Malva Limes, MD  Admit date: 04/22/2020 Discharge date: 04/24/2020  Admitted From: home Disposition: home w/ family care  Recommendations for Outpatient Follow-up:  1. Follow up with PCP in 1-2 weeks  Home Health: no Equipment/Devices:  Discharge Condition: stable  CODE STATUS: full  Diet recommendation: regular  Brief/Interim Summary: HPI was taken from Dr. Clyde Lundborg: Percell Locus Hoes is a 25 y.o. male with medical history significant of autism, nonverbal status, epilepsy, PVC, intracranial arachnoid cyst, aspiration pneumonia, who presents with seizure.  Per his parents at the bedside, pt had a choking episode last night. They were concerned about giving him his nightly medications. Therefore they did not give him any of his seizure medications last night. Pt is on clobazam, klonopin, topamax, trileptal, depakene for seizure.This morning he had a generalized tonic-clonic seizure which lasted about 30 to 45 seconds.  Per report, patient was initially postictal status, but mental status improved to normal baseline when I saw patient in ED. Pt has some cough, no chest pain, shortness of breath.  Patient has loose stool, no nausea,vomiting or abdominal pain.  No symptoms of UTI. He has fever of 100.1 in ED.  ED Course: pt was found to have WBC 10.8, lactic acid 1.6, negative Covid PCR, electrolytes renal function okay, temperature 100.1, tachycardia with heart rate 118, soft blood pressure, RR 17, oxygen saturation 99% on room air.  Chest x-ray showed mild left lower lobe infiltration concerning for aspiration.  Patient is admitted to MedSurg bed as inpatient  Hospital Course from Dr. Mayford Knife 3/27-3/28/22: Pt presented after a seizure at home. Pt had a seizure secondary to not taking seizure medications. Neuro evaluated the pt and did not recommend any change of seizure medications. Of note, pt was  found to have aspiration pneumonia (after aspirating at home) and was started on IV unasyn. Pt was transitioned to po augmentin at d/c to complete the course. For more information, please see the previous progress/consult notes.   Discharge Diagnoses:  Principal Problem:   Aspiration pneumonia (HCC) Active Problems:   Epilepsy (HCC)   Aspiration pneumonia: continue on IV unasyn and bronchodilators. Encourage incentive spirometry. Pro-cal 0.69. Continue on IVFs  Unlikely bacteremia: 1 of 2 blood cx growing staph epidermidis, likely containment   Epilepsy: hx of recurrent seizures most likely due to not taking seizure medications. Continue on home dose of clobazam, klonopin, topamax, trileptal, levocarnitine. IV ativan prn. Neuro consulted   Hypokalemia: KCl repleted. Will continue to monitor   Thrombocytopenia: etiology unclear. Will continue to monitor   Normocytic anemia: no need for a transfusion currently   Autism: non-verbal status. Continue w/ supportive care    Discharge Instructions  Discharge Instructions    Diet general   Complete by: As directed    Discharge instructions   Complete by: As directed    F/u w/ PCP in 1-2 weeks   Increase activity slowly   Complete by: As directed      Allergies as of 04/24/2020      Reactions   Lactose Intolerance (gi) Diarrhea   Peanut-containing Drug Products Hives      Medication List    TAKE these medications   amoxicillin-clavulanate 875-125 MG tablet Commonly known as: Augmentin Take 1 tablet by mouth 2 (two) times daily for 5 days.   cetirizine HCl 1 MG/ML solution Commonly known as: ZYRTEC Take 15 mg by mouth daily.   cloBAZam 2.5  MG/ML solution Commonly known as: ONFI Place 15 mg into feeding tube 2 (two) times daily.   clonazePAM 0.5 MG tablet Commonly known as: KLONOPIN Take 0.5 mg by mouth 3 (three) times daily.   fluticasone 50 MCG/ACT nasal spray Commonly known as: FLONASE Place 2 sprays into  both nostrils daily.   guanFACINE 1 MG tablet Commonly known as: TENEX Place 1 mg into feeding tube at bedtime.   levOCARNitine 1 GM/10ML solution Commonly known as: CARNITOR Take 500 mg by mouth 2 (two) times daily.   montelukast 10 MG tablet Commonly known as: SINGULAIR Take 10 mg by mouth at bedtime.   OXcarbazepine 300 MG/5ML suspension Commonly known as: TRILEPTAL Take 600 mg by mouth 2 (two) times daily.   topiramate 200 MG tablet Commonly known as: TOPAMAX Place 200 mg into feeding tube 2 (two) times daily.   valproic acid 250 MG/5ML solution Commonly known as: DEPAKENE Take 10 mLs by mouth 2 (two) times daily.   Vitamin D (Ergocalciferol) 1.25 MG (50000 UNIT) Caps capsule Commonly known as: DRISDOL Take 1 capsule (50,000 Units total) by mouth every 7 (seven) days.       Allergies  Allergen Reactions  . Lactose Intolerance (Gi) Diarrhea  . Peanut-Containing Drug Products Hives    Consultations:  Neuro, Dr. Amada JupiterKirkpatrick    Procedures/Studies: DG Chest Portable 1 View  Result Date: 04/22/2020 CLINICAL DATA:  Aspiration, seizure EXAM: PORTABLE CHEST 1 VIEW COMPARISON:  02/10/2017 FINDINGS: Mild left lower lobe opacity, possibly reflecting aspiration/pneumonia in this clinical setting, although atelectasis is also possible. Right lung is clear. No pleural effusion or pneumothorax. The heart is normal in size. Stimulator in the left neck. IMPRESSION: Mild left lower lobe opacity, possibly reflecting aspiration/pneumonia in this clinical setting, although atelectasis is also possible. Electronically Signed   By: Charline BillsSriyesh  Krishnan M.D.   On: 04/22/2020 06:20     Subjective: Pt is nonverbal. Pt is back to his baseline as per pt's mother    Discharge Exam: Vitals:   04/24/20 0523 04/24/20 0759  BP: 120/66 (!) 96/57  Pulse: (!) 43 68  Resp: 16 16  Temp: (!) 97.3 F (36.3 C) (!) 97.4 F (36.3 C)  SpO2: 100% (!) 89%   Vitals:   04/23/20 2117 04/24/20 0136  04/24/20 0523 04/24/20 0759  BP: 108/61 (!) 97/51 120/66 (!) 96/57  Pulse: 60 (!) 59 (!) 43 68  Resp: 16 16 16 16   Temp: 98.4 F (36.9 C) 98.4 F (36.9 C) (!) 97.3 F (36.3 C) (!) 97.4 F (36.3 C)  TempSrc: Oral Oral Oral Oral  SpO2: 99% 99% 100% (!) 89%  Weight:      Height:        General: Pt is alert, awake, not in acute distress Cardiovascular: S1/S2 +, no rubs, no gallops Respiratory: decreased breath sounds b/l otherwise clear Abdominal: Soft, NT, ND, bowel sounds + Extremities: no edema, no cyanosis    The results of significant diagnostics from this hospitalization (including imaging, microbiology, ancillary and laboratory) are listed below for reference.     Microbiology: Recent Results (from the past 240 hour(s))  Resp Panel by RT-PCR (Flu A&B, Covid) Nasopharyngeal Swab     Status: None   Collection Time: 04/22/20  7:05 AM   Specimen: Nasopharyngeal Swab; Nasopharyngeal(NP) swabs in vial transport medium  Result Value Ref Range Status   SARS Coronavirus 2 by RT PCR NEGATIVE NEGATIVE Final    Comment: (NOTE) SARS-CoV-2 target nucleic acids are NOT DETECTED.  The  SARS-CoV-2 RNA is generally detectable in upper respiratory specimens during the acute phase of infection. The lowest concentration of SARS-CoV-2 viral copies this assay can detect is 138 copies/mL. A negative result does not preclude SARS-Cov-2 infection and should not be used as the sole basis for treatment or other patient management decisions. A negative result may occur with  improper specimen collection/handling, submission of specimen other than nasopharyngeal swab, presence of viral mutation(s) within the areas targeted by this assay, and inadequate number of viral copies(<138 copies/mL). A negative result must be combined with clinical observations, patient history, and epidemiological information. The expected result is Negative.  Fact Sheet for Patients:   BloggerCourse.com  Fact Sheet for Healthcare Providers:  SeriousBroker.it  This test is no t yet approved or cleared by the [place] FDA and  has been authorized for detection and/or diagnosis of SARS-CoV-2 by FDA under an Emergency Use Authorization (EUA). This EUA will remain  in effect (meaning this test can be used) for the duration of the COVID-19 declaration under Section 564(b)(1) of the Act, 21 U.S.C.section 360bbb-3(b)(1), unless the authorization is terminated  or revoked sooner.       Influenza A by PCR NEGATIVE NEGATIVE Final   Influenza B by PCR NEGATIVE NEGATIVE Final    Comment: (NOTE) The Xpert Xpress SARS-CoV-2/FLU/RSV plus assay is intended as an aid in the diagnosis of influenza from Nasopharyngeal swab specimens and should not be used as a sole basis for treatment. Nasal washings and aspirates are unacceptable for Xpert Xpress SARS-CoV-2/FLU/RSV testing.  Fact Sheet for Patients: BloggerCourse.com  Fact Sheet for Healthcare Providers: SeriousBroker.it  This test is not yet approved or cleared by the [place] FDA and has been authorized for detection and/or diagnosis of SARS-CoV-2 by FDA under an Emergency Use Authorization (EUA). This EUA will remain in effect (meaning this test can be used) for the duration of the COVID-19 declaration under Section 564(b)(1) of the Act, 21 U.S.C. section 360bbb-3(b)(1), unless the authorization is terminated or revoked.  Performed at [facility], [address] [place], [place] [address]   Culture, blood (Routine X 2) w Reflex to ID Panel     Status: None (Preliminary result)   Collection Time: 04/22/20  8:18 AM   Specimen: BLOOD  Result Value Ref Range Status   Specimen Description BLOOD LEFT ARM  Final   Special Requests   Final    BOTTLES DRAWN AEROBIC AND ANAEROBIC Blood Culture adequate  volume   Culture   Final    NO GROWTH 2 DAYS Performed at [facility], [address], [place], [place] [address]    Report Status PENDING  Incomplete  Culture, blood (Routine X 2) w Reflex to ID Panel     Status: Abnormal   Collection Time: 04/22/20  8:18 AM   Specimen: BLOOD  Result Value Ref Range Status   Specimen Description   Final    BLOOD LEFT HAND Performed at [facility], [address], [place], [place] [address]    Special Requests   Final    BOTTLES DRAWN AEROBIC AND ANAEROBIC Blood Culture adequate volume Performed at [facility], [address] [place], [place] [address]    Culture  Setup Time   Final    GRAM POSITIVE COCCI AEROBIC BOTTLE ONLY CRITICAL RESULT CALLED TO, READ BACK BY AND VERIFIED WITH: NATHAN BELUE AT 0503 04/23/20 SDR    Culture (A)  Final    STAPHYLOCOCCUS EPIDERMIDIS THE SIGNIFICANCE OF  ISOLATING THIS ORGANISM FROM A SINGLE SET OF BLOOD CULTURES WHEN MULTIPLE SETS ARE DRAWN IS UNCERTAIN. PLEASE NOTIFY THE MICROBIOLOGY DEPARTMENT WITHIN ONE WEEK IF SPECIATION AND SENSITIVITIES ARE REQUIRED. Performed at [facility], [address] [place], [place] [address]    Report Status 04/24/2020 FINAL  Final  Blood Culture ID Panel (Reflexed)     Status: Abnormal   Collection Time: 04/22/20  8:18 AM  Result Value Ref Range Status   Enterococcus faecalis NOT DETECTED NOT DETECTED Final   Enterococcus Faecium NOT DETECTED NOT DETECTED Final   Listeria monocytogenes NOT DETECTED NOT DETECTED Final   Staphylococcus species DETECTED (A) NOT DETECTED Final    Comment: CRITICAL RESULT CALLED TO, READ BACK BY AND VERIFIED WITH: NATHAN BELUE AT 0503 04/23/20 SDR    Staphylococcus aureus (BCID) NOT DETECTED NOT DETECTED Final   Staphylococcus epidermidis DETECTED (A) NOT DETECTED Final    Comment: CRITICAL RESULT CALLED TO, READ BACK BY AND VERIFIED WITH:  NATHAN BELUE AT 0503 04/23/20 SDR    Staphylococcus lugdunensis NOT  DETECTED NOT DETECTED Final   Streptococcus species NOT DETECTED NOT DETECTED Final   Streptococcus agalactiae NOT DETECTED NOT DETECTED Final   Streptococcus pneumoniae NOT DETECTED NOT DETECTED Final   Streptococcus pyogenes NOT DETECTED NOT DETECTED Final   A.calcoaceticus-baumannii NOT DETECTED NOT DETECTED Final   Bacteroides fragilis NOT DETECTED NOT DETECTED Final   Enterobacterales NOT DETECTED NOT DETECTED Final   Enterobacter cloacae complex NOT DETECTED NOT DETECTED Final   Escherichia coli NOT DETECTED NOT DETECTED Final   Klebsiella aerogenes NOT DETECTED NOT DETECTED Final   Klebsiella oxytoca NOT DETECTED NOT DETECTED Final   Klebsiella pneumoniae NOT DETECTED NOT DETECTED Final   Proteus species NOT DETECTED NOT DETECTED Final   Salmonella species NOT DETECTED NOT DETECTED Final   Serratia marcescens NOT DETECTED NOT DETECTED Final   Haemophilus influenzae NOT DETECTED NOT DETECTED Final   Neisseria meningitidis NOT DETECTED NOT DETECTED Final   Pseudomonas aeruginosa NOT DETECTED NOT DETECTED Final   Stenotrophomonas maltophilia NOT DETECTED NOT DETECTED Final   Candida albicans NOT DETECTED NOT DETECTED Final   Candida auris NOT DETECTED NOT DETECTED Final   Candida glabrata NOT DETECTED NOT DETECTED Final   Candida krusei NOT DETECTED NOT DETECTED Final   Candida parapsilosis NOT DETECTED NOT DETECTED Final   Candida tropicalis NOT DETECTED NOT DETECTED Final   Cryptococcus neoformans/gattii NOT DETECTED NOT DETECTED Final   Methicillin resistance mecA/C NOT DETECTED NOT DETECTED Final    Comment: Performed at [facility], [address] [place], [place] [address]     Labs: BNP (last 3 results) No results for input(s): BNP in the last 8760 hours. Basic Metabolic Panel: Recent Labs  Lab 04/22/20 0604 04/23/20 0438 04/24/20 0407  NA 140 134* 136  K 3.6 3.7 3.4*  CL 110 108 108  CO2 24 19* 22  GLUCOSE 84 84 86  BUN 22* 16 18  CREATININE 0.40*  0.48* 0.37*  CALCIUM 9.0 8.4* 8.6*   Liver Function Tests: Recent Labs  Lab 04/22/20 0604  AST 19  ALT 13  ALKPHOS 60  BILITOT 0.6  PROT 6.8  ALBUMIN 3.8   No results for input(s): LIPASE, AMYLASE in the last 168 hours. No results for input(s): AMMONIA in the last 168 hours. CBC: Recent Labs  Lab 04/22/20 0604 04/23/20 0438 04/24/20 0407  WBC 10.8* 9.7 7.4  NEUTROABS 8.0*  --   --   HGB 12.8* 10.2*  10.4*  HCT 37.8* 30.4* 29.9*  MCV 90.2 91.3 87.4  PLT 148* 141* 143*   Cardiac Enzymes: No results for input(s): CKTOTAL, CKMB, CKMBINDEX, TROPONINI in the last 168 hours. BNP: Invalid input(s): POCBNP CBG: No results for input(s): GLUCAP in the last 168 hours. D-Dimer No results for input(s): DDIMER in the last 72 hours. Hgb A1c No results for input(s): HGBA1C in the last 72 hours. Lipid Profile No results for input(s): CHOL, HDL, LDLCALC, TRIG, CHOLHDL, LDLDIRECT in the last 72 hours. Thyroid function studies No results for input(s): TSH, T4TOTAL, T3FREE, THYROIDAB in the last 72 hours.  Invalid input(s): FREET3 Anemia work up No results for input(s): VITAMINB12, FOLATE, FERRITIN, TIBC, IRON, RETICCTPCT in the last 72 hours. Urinalysis    Component Value Date/Time   COLORURINE YELLOW (A) 04/22/2020 0716   APPEARANCEUR HAZY (A) 04/22/2020 0716   APPEARANCEUR Clear 12/27/2012 0012   LABSPEC 1.021 04/22/2020 0716   LABSPEC 1.017 12/27/2012 0012   PHURINE 7.0 04/22/2020 0716   GLUCOSEU NEGATIVE 04/22/2020 0716   GLUCOSEU Negative 12/27/2012 0012   HGBUR NEGATIVE 04/22/2020 0716   BILIRUBINUR NEGATIVE 04/22/2020 0716   BILIRUBINUR Negative 12/27/2012 0012   KETONESUR NEGATIVE 04/22/2020 0716   PROTEINUR NEGATIVE 04/22/2020 0716   NITRITE NEGATIVE 04/22/2020 0716   LEUKOCYTESUR NEGATIVE 04/22/2020 0716   LEUKOCYTESUR Negative 12/27/2012 0012   Sepsis Labs Invalid input(s): PROCALCITONIN,  WBC,  LACTICIDVEN Microbiology Recent Results (from the past 240  hour(s))  Resp Panel by RT-PCR (Flu A&B, Covid) Nasopharyngeal Swab     Status: None   Collection Time: 04/22/20  7:05 AM   Specimen: Nasopharyngeal Swab; Nasopharyngeal(NP) swabs in vial transport medium  Result Value Ref Range Status   SARS Coronavirus 2 by RT PCR NEGATIVE NEGATIVE Final    Comment: (NOTE) SARS-CoV-2 target nucleic acids are NOT DETECTED.  The SARS-CoV-2 RNA is generally detectable in upper respiratory specimens during the acute phase of infection. The lowest concentration of SARS-CoV-2 viral copies this assay can detect is 138 copies/mL. A negative result does not preclude SARS-Cov-2 infection and should not be used as the sole basis for treatment or other patient management decisions. A negative result may occur with  improper specimen collection/handling, submission of specimen other than nasopharyngeal swab, presence of viral mutation(s) within the areas targeted by this assay, and inadequate number of viral copies(<138 copies/mL). A negative result must be combined with clinical observations, patient history, and epidemiological information. The expected result is Negative.  Fact Sheet for Patients:  BloggerCourse.com  Fact Sheet for Healthcare Providers:  SeriousBroker.it  This test is no t yet approved or cleared by the [place] FDA and  has been authorized for detection and/or diagnosis of SARS-CoV-2 by FDA under an Emergency Use Authorization (EUA). This EUA will remain  in effect (meaning this test can be used) for the duration of the COVID-19 declaration under Section 564(b)(1) of the Act, 21 U.S.C.section 360bbb-3(b)(1), unless the authorization is terminated  or revoked sooner.       Influenza A by PCR NEGATIVE NEGATIVE Final   Influenza B by PCR NEGATIVE NEGATIVE Final    Comment: (NOTE) The Xpert Xpress SARS-CoV-2/FLU/RSV plus assay is intended as an aid in the diagnosis of influenza from  Nasopharyngeal swab specimens and should not be used as a sole basis for treatment. Nasal washings and aspirates are unacceptable for Xpert Xpress SARS-CoV-2/FLU/RSV testing.  Fact Sheet for Patients: BloggerCourse.com  Fact Sheet for Healthcare Providers: SeriousBroker.it  This test is not yet  approved or cleared by the [place] and has been authorized for detection and/or diagnosis of SARS-CoV-2 by FDA under an Emergency Use Authorization (EUA). This EUA will remain in effect (meaning this test can be used) for the duration of the COVID-19 declaration under Section 564(b)(1) of the Act, 21 U.S.C. section 360bbb-3(b)(1), unless the authorization is terminated or revoked.  Performed at [facility], [address] [place], [place] [address]   Culture, blood (Routine X 2) w Reflex to ID Panel     Status: None (Preliminary result)   Collection Time: 04/22/20  8:18 AM   Specimen: BLOOD  Result Value Ref Range Status   Specimen Description BLOOD LEFT ARM  Final   Special Requests   Final    BOTTLES DRAWN AEROBIC AND ANAEROBIC Blood Culture adequate volume   Culture   Final    NO GROWTH 2 DAYS Performed at [facility], [address], [place], [place] [address]    Report Status PENDING  Incomplete  Culture, blood (Routine X 2) w Reflex to ID Panel     Status: Abnormal   Collection Time: 04/22/20  8:18 AM   Specimen: BLOOD  Result Value Ref Range Status   Specimen Description   Final    BLOOD LEFT HAND Performed at [facility], [address], [place], [place] [address]    Special Requests   Final    BOTTLES DRAWN AEROBIC AND ANAEROBIC Blood Culture adequate volume Performed at [facility], [address] [place], [place] [address]    Culture  Setup Time   Final    GRAM POSITIVE COCCI AEROBIC BOTTLE ONLY CRITICAL RESULT CALLED TO, READ BACK BY AND VERIFIED WITH: NATHAN  BELUE AT 0503 04/23/20 SDR    Culture (A)  Final    STAPHYLOCOCCUS EPIDERMIDIS THE SIGNIFICANCE OF ISOLATING THIS ORGANISM FROM A SINGLE SET OF BLOOD CULTURES WHEN MULTIPLE SETS ARE DRAWN IS UNCERTAIN. PLEASE NOTIFY THE MICROBIOLOGY DEPARTMENT WITHIN ONE WEEK IF SPECIATION AND SENSITIVITIES ARE REQUIRED. Performed at [facility], [address], [place], [place] [address]    Report Status 04/24/2020 FINAL  Final  Blood Culture ID Panel (Reflexed)     Status: Abnormal   Collection Time: 04/22/20  8:18 AM  Result Value Ref Range Status   Enterococcus faecalis NOT DETECTED NOT DETECTED Final   Enterococcus Faecium NOT DETECTED NOT DETECTED Final   Listeria monocytogenes NOT DETECTED NOT DETECTED Final   Staphylococcus species DETECTED (A) NOT DETECTED Final    Comment: CRITICAL RESULT CALLED TO, READ BACK BY AND VERIFIED WITH: NATHAN BELUE AT 0503 04/23/20 SDR    Staphylococcus aureus (BCID) NOT DETECTED NOT DETECTED Final   Staphylococcus epidermidis DETECTED (A) NOT DETECTED Final    Comment: CRITICAL RESULT CALLED TO, READ BACK BY AND VERIFIED WITH:  NATHAN BELUE AT 0503 04/23/20 SDR    Staphylococcus lugdunensis NOT DETECTED NOT DETECTED Final   Streptococcus species NOT DETECTED NOT DETECTED Final   Streptococcus agalactiae NOT DETECTED NOT DETECTED Final   Streptococcus pneumoniae NOT DETECTED NOT DETECTED Final   Streptococcus pyogenes NOT DETECTED NOT DETECTED Final   A.calcoaceticus-baumannii NOT DETECTED NOT DETECTED Final   Bacteroides fragilis NOT DETECTED NOT DETECTED Final   Enterobacterales NOT DETECTED NOT DETECTED Final   Enterobacter cloacae complex NOT DETECTED NOT DETECTED Final   Escherichia coli NOT DETECTED NOT DETECTED Final   Klebsiella aerogenes NOT DETECTED NOT DETECTED Final   Klebsiella oxytoca NOT DETECTED NOT DETECTED  Final   Klebsiella pneumoniae NOT DETECTED NOT DETECTED Final   Proteus species NOT DETECTED NOT DETECTED Final   Salmonella species  NOT DETECTED NOT DETECTED Final   Serratia marcescens NOT DETECTED NOT DETECTED Final   Haemophilus influenzae NOT DETECTED NOT DETECTED Final   Neisseria meningitidis NOT DETECTED NOT DETECTED Final   Pseudomonas aeruginosa NOT DETECTED NOT DETECTED Final   Stenotrophomonas maltophilia NOT DETECTED NOT DETECTED Final   Candida albicans NOT DETECTED NOT DETECTED Final   Candida auris NOT DETECTED NOT DETECTED Final   Candida glabrata NOT DETECTED NOT DETECTED Final   Candida krusei NOT DETECTED NOT DETECTED Final   Candida parapsilosis NOT DETECTED NOT DETECTED Final   Candida tropicalis NOT DETECTED NOT DETECTED Final   Cryptococcus neoformans/gattii NOT DETECTED NOT DETECTED Final   Methicillin resistance mecA/C NOT DETECTED NOT DETECTED Final    Comment: Performed at [facility], [address] [place], [place] [address]     Time coordinating discharge: Over 30 minutes  SIGNED:   Charise Killian, MD  [facility] 04/24/2020, 11:06 AM Pager   If 7PM-7AM, please contact night-coverage

## 2020-04-24 NOTE — Plan of Care (Signed)
  Problem: Fluid Volume: Goal: Hemodynamic stability will improve Outcome: Progressing   

## 2020-04-25 ENCOUNTER — Other Ambulatory Visit: Payer: Self-pay | Admitting: *Deleted

## 2020-04-25 NOTE — Patient Outreach (Signed)
[facility]) Care Management  04/25/2020  Noah Marshall March 12, 1995 [id number]   Transition of care telephone call  Referral received:04/24/20 Initial outreach:04/25/20 Insurance: [employer]   Initial unsuccessful telephone call to patient's preferred number that is same as Patient Mother, Noah Marshall, patient listed as having Legal Guardian, left HIPAA compliant voicemail message requesting return call.   Objective: Per the electronic medical record, Mr. Noah Marshall   was hospitalized at [facility] 3/26-3/28/22 for Seizure, Aspiration Pneumonia  . Comorbidities include: Autism, non verbal ,Epilepsy,   He was discharged to home on 04/24/20  without the need for home health services or durable medical equipment per the discharge summary.   Plan: This RNCM will route unsuccessful outreach letter with [facility] Care Management pamphlet and 24 hour Nurse Advice Line Magnet to [employer] Care Management clinical pool to be mailed to patient's home address. This RNCM will attempt another outreach within 4 business days.  Egbert Garibaldi, RN, BSN  [facility] Care Management,Care Management Coordinator  [phone]- Mobile [phone]- Toll Free Main Office

## 2020-04-26 ENCOUNTER — Telehealth: Payer: Self-pay

## 2020-04-26 NOTE — Telephone Encounter (Signed)
I have made the 1st attempt to contact the patient or family member in charge, in order to follow up from recently being discharged from the hospital. I left a message on voicemail requesting a CB. -MM 

## 2020-04-27 LAB — CULTURE, BLOOD (ROUTINE X 2)
Culture: NO GROWTH
Special Requests: ADEQUATE

## 2020-04-27 LAB — LEVETIRACETAM LEVEL: Levetiracetam Lvl: 10.9 ug/mL (ref 10.0–40.0)

## 2020-04-27 NOTE — Telephone Encounter (Signed)
I have made the 2nd attempt to contact the patient or family member in charge, in order to follow up from recently being discharged from the hospital. I left a message on voicemail requesting a CB. -MM  

## 2020-04-28 ENCOUNTER — Other Ambulatory Visit: Payer: Self-pay | Admitting: *Deleted

## 2020-04-28 NOTE — Patient Outreach (Signed)
[facility]) Care Management  04/28/2020  Noah Marshall 1995-06-23 [id number]  Transition of care call Referral received: 04/24/20 Initial outreach attempt: 04/25/20 Insurance: [employer]   Outreach attempt #2 Unsuccessful telephone call to patient's preferred number that is same as Patient Mother, Noah Marshall, patient listed as having Legal Guardian, left HIPAA compliant voicemail message requesting return call.    Objective: Per the electronic medical record, Noah Marshall   was hospitalized at [facility] 3/26-3/28/22 for Seizure, Aspiration Pneumonia  . Comorbidities include: Autism, non verbal ,Epilepsy,   He was discharged to home on 04/24/20  without the need for home health services or durable medical equipment per the discharge summary.    Plan If no return call from patient will attempt 3rd outreach in the next 3 to  4 business days.  Egbert Garibaldi, RN, BSN  [facility] Care Management,Care Management Coordinator  [phone]- Mobile [phone]- Toll Free Main Office

## 2020-04-29 ENCOUNTER — Other Ambulatory Visit: Payer: Self-pay

## 2020-04-29 MED FILL — Valproate Sodium Oral Soln 250 MG/5ML (Base Equiv): ORAL | 30 days supply | Qty: 600 | Fill #0 | Status: AC

## 2020-05-01 NOTE — Telephone Encounter (Signed)
I have tried to contact this pt on two separate occassions and left a VM requesting a CB. Pt has not returned my calls or VM. HFU scheduled for 05/10/20 @ 11:00 AM. Lorain Childes to PCP. -MM

## 2020-05-02 ENCOUNTER — Other Ambulatory Visit: Payer: Self-pay | Admitting: *Deleted

## 2020-05-02 NOTE — Patient Outreach (Signed)
[facility]) Care Management  05/02/2020  Noah Marshall Jan 15, 1996 [id number]   Transition of care call Referral received: 04/24/20 Initial outreach attempt: 04/25/20 Insurance: [employer]  Outreach Attempt #3 Third unsuccessful telephone call to patient's preferred contact number in order to complete post hospital discharge transition of care assessment; no answer, left HIPAA compliant message requesting return call.   Objective: Per the electronic medical record, Noah Marshall hospitalized at [facility] 3/26-3/28/22 for Seizure, Aspiration Pneumonia. Comorbidities include: Autism, non verbal ,Epilepsy,He was discharged to home on 3/28/22without the need for home health services or durable medical equipment per the discharge summary.     Plan: If no return call from patient/Mother  will plan return call in the next 3 weeks.   Egbert Garibaldi, RN, BSN  [facility] Care Management,Care Management Coordinator  [phone]- Mobile [phone]- Toll Free Main Office

## 2020-05-10 ENCOUNTER — Ambulatory Visit (INDEPENDENT_AMBULATORY_CARE_PROVIDER_SITE_OTHER): Payer: 59 | Admitting: Family Medicine

## 2020-05-10 ENCOUNTER — Other Ambulatory Visit: Payer: Self-pay

## 2020-05-10 ENCOUNTER — Encounter: Payer: Self-pay | Admitting: Family Medicine

## 2020-05-10 VITALS — BP 106/65 | HR 56 | Temp 97.5°F | Resp 18 | Wt 138.0 lb

## 2020-05-10 DIAGNOSIS — F840 Autistic disorder: Secondary | ICD-10-CM | POA: Diagnosis not present

## 2020-05-10 DIAGNOSIS — J690 Pneumonitis due to inhalation of food and vomit: Secondary | ICD-10-CM | POA: Diagnosis not present

## 2020-05-10 DIAGNOSIS — G930 Cerebral cysts: Secondary | ICD-10-CM | POA: Diagnosis not present

## 2020-05-10 DIAGNOSIS — G40009 Localization-related (focal) (partial) idiopathic epilepsy and epileptic syndromes with seizures of localized onset, not intractable, without status epilepticus: Secondary | ICD-10-CM | POA: Diagnosis not present

## 2020-05-10 NOTE — Progress Notes (Signed)
Established patient visit   Patient: Noah Marshall   DOB: 23-Aug-1995   25 y.o. Male  MRN: [id number] Visit Date: 05/10/2020  Today's healthcare provider: Mila Merry, MD   Chief Complaint  Patient presents with  . Hospitalization Follow-up   Subjective    HPI  Follow up Hospitalization  Patient was admitted to [facility] on 04/22/2020 and discharged on 04/24/2020. He was treated for seizure, sepsis and aspiration pneumonia. Seizure apparently occurred when patient refused to take his medications. He is not taking them consistently.  Treatment for this included  IV unasyn and bronchodilators. Pt was transitioned to po augmentin at discharge to complete the course. Per discharge summary patient was to follow up with PCP in 1-2 weeks. Telephone follow up was not done; health nurse advisor was unable to reach caregiver by phone. He reports good compliance with treatment. He reports this condition is resolved. Patients last seizure was 2 weeks ago. His mother reports he is back to his baseline with no fevers, coughing, or other respiratory sx since dishcarged.   ----------------------------------------------------------------------------------------- -      Medications: Outpatient Medications Prior to Visit  Medication Sig  . cetirizine HCl (ZYRTEC) 1 MG/ML solution Take 15 mg by mouth daily.  . cloBAZam (ONFI) 2.5 MG/ML solution SHAKE WELL AND GIVE 6 ML BY MOUTH TWICE DAILY  . clonazePAM (KLONOPIN) 0.5 MG tablet TAKE 1 TABLET BY MOUTH 3 TIMES DAILY.  . fluticasone (FLONASE) 50 MCG/ACT nasal spray Place 2 sprays into both nostrils daily.  Marland Kitchen guanFACINE (TENEX) 1 MG tablet TAKE 1 TABLET BY G-TUBE 2 TIMES DAILY  . levOCARNitine (CARNITOR) 1 GM/10ML solution TAKE 5 ML BY MOUTH TWO TIMES DAILY  . montelukast (SINGULAIR) 10 MG tablet Take 10 mg by mouth at bedtime.  . OXcarbazepine (TRILEPTAL) 300 MG/5ML suspension Take 600 mg by mouth 2 (two) times daily.   Marland Kitchen topiramate (TOPAMAX) 200  MG tablet Place 200 mg into feeding tube 2 (two) times daily.   Marland Kitchen topiramate (TOPAMAX) 200 MG tablet TAKE 1 TABLET BY MOUTH TWICE DAILY, CRUSHED. NO MORE G-TUBE (3 YEARS).  Marland Kitchen TRILEPTAL 300 MG/5ML suspension TAKE 10 MLS BY MOUTH 2 TIMES DAILY  . valproic acid (DEPAKENE) 250 MG/5ML solution TAKE 10 MLS BY MOUTH 2 TIMES DAILY  . Vitamin D, Ergocalciferol, (DRISDOL) 50000 units CAPS capsule Take 1 capsule (50,000 Units total) by mouth every 7 (seven) days.  . [DISCONTINUED] clonazePAM (KLONOPIN) 0.5 MG tablet Take 0.5 mg by mouth 3 (three) times daily.  . [DISCONTINUED] guanFACINE (TENEX) 1 MG tablet Place 1 mg into feeding tube at bedtime.   . [DISCONTINUED] levOCARNitine (CARNITOR) 1 GM/10ML solution Take 500 mg by mouth 2 (two) times daily.  . [DISCONTINUED] valproic acid (DEPAKENE) 250 MG/5ML solution Take 10 mLs by mouth 2 (two) times daily.  . [DISCONTINUED] amoxicillin-clavulanate (AUGMENTIN) 875-125 MG tablet TAKE 1 TABLET BY MOUTH 2 (TWO) TIMES DAILY FOR 5 DAYS. (Patient not taking: Reported on 05/10/2020)  . [DISCONTINUED] cloBAZam (ONFI) 2.5 MG/ML solution Place 15 mg into feeding tube 2 (two) times daily.  (Patient not taking: Reported on 05/10/2020)   No facility-administered medications prior to visit.    Review of Systems  Constitutional: Negative for appetite change, chills and fever.  Respiratory: Negative for chest tightness, shortness of breath and wheezing.   Cardiovascular: Negative for chest pain and palpitations.  Gastrointestinal: Negative for abdominal pain, nausea and vomiting.  Neurological: Positive for seizures.       Objective  BP 106/65 (BP Location: Left Arm, Patient Position: Sitting, Cuff Size: Normal)   Pulse (!) 56   Temp (!) 97.5 F (36.4 C) (Temporal)   Resp 18   Wt 138 lb (62.6 kg)   SpO2 97% Comment: room air  BMI 22.27 kg/m     Physical Exam   General: Appearance:    Well developed, well nourished male in no acute distress  Eyes:    PERRL,  conjunctiva/corneas clear, EOM's intact       Lungs:     Clear to auscultation bilaterally, respirations unlabored  Heart:    Bradycardic. Normal rhythm. No murmurs, rubs, or gallops.   MS:   All extremities are intact.   Neurologic:   Awake, alert. Sitting comfortable in wheelchair.         Assessment & Plan     1. Aspiration pneumonia of left lower lobe, unspecified aspiration pneumonia type (HCC) Resolved.   2. Partial idiopathic epilepsy with seizures of localized onset, not intractable, without status epilepticus (HCC) Very well controlled, now complaint with medications. Managed at [facility] neurology.   3. Intracranial arachnoid cysts Stable, continue routine follow up [facility] neurology.   4. Autistic disorder Stable, continue routine follow up [facility] neurology.         The entirety of the information documented in the History of Present Illness, Review of Systems and Physical Exam were personally obtained by me. Portions of this information were initially documented by the CMA and reviewed by me for thoroughness and accuracy.      Mila Merry, MD  [facility] [phone] (phone) [phone] (fax)  [facility]

## 2020-05-17 ENCOUNTER — Other Ambulatory Visit: Payer: Self-pay

## 2020-05-17 MED FILL — Oxcarbazepine Susp 300 MG/5ML (60 MG/ML): ORAL | 25 days supply | Qty: 500 | Fill #0 | Status: AC

## 2020-05-17 MED FILL — Clonazepam Tab 0.5 MG: ORAL | 30 days supply | Qty: 90 | Fill #0 | Status: AC

## 2020-05-17 MED FILL — Levocarnitine Oral Soln 1 GM/10ML (10%): ORAL | 30 days supply | Qty: 300 | Fill #0 | Status: AC

## 2020-05-17 MED FILL — Clobazam Suspension 2.5 MG/ML: ORAL | 30 days supply | Qty: 360 | Fill #0 | Status: AC

## 2020-05-19 ENCOUNTER — Other Ambulatory Visit: Payer: Self-pay

## 2020-05-23 ENCOUNTER — Other Ambulatory Visit: Payer: Self-pay | Admitting: *Deleted

## 2020-05-23 NOTE — Patient Outreach (Signed)
[facility]) Care Management  05/23/2020  Noah Marshall September 24, 1995 [id number]   Transition of care /Case Closure Unsuccessful outreach    Referral received:04/24/20 Initial outreach:04/25/20 Insurance: [employer]   Unable to complete post hospital discharge transition of care assessment. No return call form patient after 3 call attempts and no response to request to contact RN Care Coordinator in unsuccessful outreach letter mailed to home on 04/25/20.  Objective: Per the electronic medical record, Mr. Noah Marshall hospitalized at [facility] 3/26-3/28/22 for Seizure, Aspiration Pneumonia. Comorbidities include: Autism, non verbal ,Epilepsy,He was discharged to home on 3/28/22without the need for home health services or durable medical equipment per the discharge summary.  Plan Case closed to [facility] ,unsuccessful call attempt x 4.   Egbert Garibaldi, RN, BSN  [facility] Care Management,Care Management Coordinator  [phone]- Mobile [phone]- Toll Free Main Office

## 2020-05-26 ENCOUNTER — Other Ambulatory Visit: Payer: Self-pay

## 2020-05-26 MED FILL — Valproate Sodium Oral Soln 250 MG/5ML (Base Equiv): ORAL | 30 days supply | Qty: 600 | Fill #1 | Status: AC

## 2020-05-26 MED FILL — Topiramate Tab 200 MG: ORAL | 30 days supply | Qty: 60 | Fill #0 | Status: AC

## 2020-06-02 ENCOUNTER — Other Ambulatory Visit: Payer: Self-pay

## 2020-06-02 MED FILL — Guanfacine HCl Tab 1 MG: ORAL | 30 days supply | Qty: 60 | Fill #0 | Status: AC

## 2020-06-14 ENCOUNTER — Other Ambulatory Visit: Payer: Self-pay

## 2020-06-14 MED FILL — Oxcarbazepine Susp 300 MG/5ML (60 MG/ML): ORAL | 25 days supply | Qty: 500 | Fill #1 | Status: AC

## 2020-06-16 ENCOUNTER — Other Ambulatory Visit: Payer: Self-pay

## 2020-06-20 MED FILL — Clobazam Suspension 2.5 MG/ML: ORAL | 30 days supply | Qty: 360 | Fill #1 | Status: AC

## 2020-06-21 ENCOUNTER — Other Ambulatory Visit: Payer: Self-pay

## 2020-06-21 MED FILL — Levocarnitine Oral Soln 1 GM/10ML (10%): ORAL | 30 days supply | Qty: 300 | Fill #1 | Status: AC

## 2020-06-21 MED FILL — Valproate Sodium Oral Soln 250 MG/5ML (Base Equiv): ORAL | 30 days supply | Qty: 600 | Fill #2 | Status: AC

## 2020-06-30 ENCOUNTER — Other Ambulatory Visit: Payer: Self-pay

## 2020-06-30 MED FILL — Topiramate Tab 200 MG: ORAL | 30 days supply | Qty: 60 | Fill #1 | Status: AC

## 2020-07-10 MED FILL — Clonazepam Tab 0.5 MG: ORAL | 30 days supply | Qty: 90 | Fill #1 | Status: AC

## 2020-07-10 MED FILL — Oxcarbazepine Susp 300 MG/5ML (60 MG/ML): ORAL | 25 days supply | Qty: 500 | Fill #2 | Status: CN

## 2020-07-10 MED FILL — Guanfacine HCl Tab 1 MG: ORAL | 30 days supply | Qty: 60 | Fill #1 | Status: AC

## 2020-07-11 ENCOUNTER — Other Ambulatory Visit: Payer: Self-pay

## 2020-07-12 ENCOUNTER — Other Ambulatory Visit: Payer: Self-pay

## 2020-07-12 MED FILL — Oxcarbazepine Susp 300 MG/5ML (60 MG/ML): ORAL | 25 days supply | Qty: 500 | Fill #2 | Status: AC

## 2020-07-18 ENCOUNTER — Other Ambulatory Visit: Payer: Self-pay

## 2020-07-18 MED FILL — Valproate Sodium Oral Soln 250 MG/5ML (Base Equiv): ORAL | 30 days supply | Qty: 600 | Fill #3 | Status: AC

## 2020-07-19 ENCOUNTER — Other Ambulatory Visit: Payer: Self-pay

## 2020-07-20 ENCOUNTER — Other Ambulatory Visit: Payer: Self-pay

## 2020-07-20 MED ORDER — CLOBAZAM 2.5 MG/ML PO SUSP
ORAL | 5 refills | Status: DC
  Filled 2020-07-20: qty 360, 30d supply, fill #0
  Filled 2020-08-16: qty 360, 30d supply, fill #1

## 2020-07-26 ENCOUNTER — Other Ambulatory Visit: Payer: Self-pay

## 2020-07-26 MED FILL — Levocarnitine Oral Soln 1 GM/10ML (10%): ORAL | 30 days supply | Qty: 300 | Fill #2 | Status: AC

## 2020-07-26 MED FILL — Topiramate Tab 200 MG: ORAL | 30 days supply | Qty: 60 | Fill #2 | Status: AC

## 2020-08-04 ENCOUNTER — Other Ambulatory Visit: Payer: Self-pay

## 2020-08-04 MED FILL — Oxcarbazepine Susp 300 MG/5ML (60 MG/ML): ORAL | 25 days supply | Qty: 500 | Fill #3 | Status: AC

## 2020-08-16 ENCOUNTER — Other Ambulatory Visit: Payer: Self-pay

## 2020-08-17 ENCOUNTER — Other Ambulatory Visit: Payer: Self-pay

## 2020-08-17 MED ORDER — CLONAZEPAM 0.5 MG PO TABS
0.5000 mg | ORAL_TABLET | Freq: Three times a day (TID) | ORAL | 5 refills | Status: DC
  Filled 2020-08-17: qty 90, 30d supply, fill #0
  Filled 2020-09-19: qty 90, 30d supply, fill #1
  Filled 2020-10-18 – 2020-10-19 (×2): qty 90, 30d supply, fill #2
  Filled 2020-12-12: qty 90, 30d supply, fill #3
  Filled 2021-01-14: qty 90, 30d supply, fill #4

## 2020-08-18 ENCOUNTER — Other Ambulatory Visit: Payer: Self-pay

## 2020-08-21 ENCOUNTER — Other Ambulatory Visit: Payer: Self-pay

## 2020-08-21 MED ORDER — GUANFACINE HCL 1 MG PO TABS
ORAL_TABLET | ORAL | 11 refills | Status: DC
  Filled 2020-08-21: qty 60, 30d supply, fill #0

## 2020-08-24 ENCOUNTER — Other Ambulatory Visit: Payer: Self-pay

## 2020-08-24 MED FILL — Valproate Sodium Oral Soln 250 MG/5ML (Base Equiv): ORAL | 30 days supply | Qty: 600 | Fill #4 | Status: AC

## 2020-08-29 ENCOUNTER — Other Ambulatory Visit: Payer: Self-pay

## 2020-08-29 DIAGNOSIS — R569 Unspecified convulsions: Secondary | ICD-10-CM | POA: Diagnosis not present

## 2020-08-29 DIAGNOSIS — G40901 Epilepsy, unspecified, not intractable, with status epilepticus: Secondary | ICD-10-CM | POA: Diagnosis not present

## 2020-08-29 DIAGNOSIS — G40419 Other generalized epilepsy and epileptic syndromes, intractable, without status epilepticus: Secondary | ICD-10-CM | POA: Diagnosis not present

## 2020-08-29 DIAGNOSIS — G40909 Epilepsy, unspecified, not intractable, without status epilepticus: Secondary | ICD-10-CM | POA: Diagnosis not present

## 2020-08-29 MED ORDER — TOPIRAMATE 200 MG PO TABS
ORAL_TABLET | ORAL | 11 refills | Status: DC
  Filled 2020-08-29: qty 60, 30d supply, fill #0
  Filled 2020-09-26: qty 60, 30d supply, fill #1
  Filled 2020-10-23 – 2020-10-24 (×2): qty 60, 30d supply, fill #2
  Filled 2020-11-22: qty 60, 30d supply, fill #3
  Filled 2020-12-19: qty 60, 30d supply, fill #4
  Filled 2021-01-14: qty 60, 30d supply, fill #5
  Filled 2021-02-26: qty 60, 30d supply, fill #6
  Filled 2021-03-23: qty 60, 30d supply, fill #7
  Filled 2021-04-24: qty 60, 30d supply, fill #8
  Filled 2021-05-27: qty 60, 30d supply, fill #9
  Filled 2021-06-27: qty 60, 30d supply, fill #10
  Filled 2021-07-23: qty 60, 30d supply, fill #11

## 2020-08-29 MED ORDER — VALPROIC ACID 250 MG/5ML PO SOLN
ORAL | 11 refills | Status: DC
  Filled 2020-09-19: qty 600, 30d supply, fill #0
  Filled 2020-10-19: qty 600, 30d supply, fill #1
  Filled 2020-11-17: qty 600, 30d supply, fill #2
  Filled 2020-12-19: qty 600, 30d supply, fill #3
  Filled 2021-01-15: qty 600, 30d supply, fill #4
  Filled 2021-02-14: qty 600, 30d supply, fill #5
  Filled 2021-03-16: qty 600, 30d supply, fill #6
  Filled 2021-04-16: qty 600, 30d supply, fill #7
  Filled 2021-05-10: qty 600, 30d supply, fill #8
  Filled 2021-06-12: qty 600, 30d supply, fill #9
  Filled 2021-07-10: qty 600, 30d supply, fill #10
  Filled 2021-08-08: qty 600, 30d supply, fill #11

## 2020-08-29 MED ORDER — LEVOCARNITINE 1 GM/10ML PO SOLN
ORAL | 11 refills | Status: DC
  Filled 2020-08-29: qty 300, 30d supply, fill #0
  Filled 2020-10-18 – 2020-10-19 (×2): qty 300, 30d supply, fill #1
  Filled 2020-11-17: qty 300, 30d supply, fill #2
  Filled 2021-01-01: qty 300, 30d supply, fill #3
  Filled 2021-02-13: qty 300, 30d supply, fill #4
  Filled 2021-03-23: qty 300, 30d supply, fill #5
  Filled 2021-05-08: qty 300, 30d supply, fill #6
  Filled 2021-06-12: qty 300, 30d supply, fill #7
  Filled 2021-07-25: qty 300, 30d supply, fill #8

## 2020-08-29 MED ORDER — TRILEPTAL 300 MG/5ML PO SUSP
ORAL | 11 refills | Status: DC
  Filled 2020-08-29: qty 600, 30d supply, fill #0

## 2020-08-29 MED ORDER — CLOBAZAM 2.5 MG/ML PO SUSP
ORAL | 5 refills | Status: DC
  Filled 2020-09-20: qty 360, 30d supply, fill #0
  Filled 2020-10-18 – 2020-10-19 (×2): qty 360, 30d supply, fill #1
  Filled 2020-11-15: qty 360, 30d supply, fill #2
  Filled 2020-12-12: qty 360, 30d supply, fill #3
  Filled 2021-01-14: qty 360, 30d supply, fill #4
  Filled 2021-02-13: qty 360, 30d supply, fill #5

## 2020-08-30 ENCOUNTER — Other Ambulatory Visit: Payer: Self-pay

## 2020-08-31 ENCOUNTER — Other Ambulatory Visit: Payer: Self-pay

## 2020-08-31 MED FILL — Oxcarbazepine Susp 300 MG/5ML (60 MG/ML): ORAL | 25 days supply | Qty: 500 | Fill #4 | Status: CN

## 2020-08-31 MED FILL — Topiramate Tab 200 MG: ORAL | 30 days supply | Qty: 60 | Fill #3 | Status: CN

## 2020-08-31 MED FILL — Levocarnitine Oral Soln 1 GM/10ML (10%): ORAL | 30 days supply | Qty: 300 | Fill #3 | Status: CN

## 2020-09-01 ENCOUNTER — Other Ambulatory Visit: Payer: Self-pay

## 2020-09-14 ENCOUNTER — Other Ambulatory Visit: Payer: Self-pay

## 2020-09-14 ENCOUNTER — Emergency Department: Payer: 59

## 2020-09-14 ENCOUNTER — Ambulatory Visit: Admit: 2020-09-14 | Payer: 59 | Admitting: Gastroenterology

## 2020-09-14 ENCOUNTER — Encounter: Payer: Self-pay | Admitting: Radiology

## 2020-09-14 ENCOUNTER — Inpatient Hospital Stay: Payer: 59 | Admitting: Registered Nurse

## 2020-09-14 ENCOUNTER — Inpatient Hospital Stay
Admission: EM | Admit: 2020-09-14 | Discharge: 2020-09-15 | DRG: 394 | Disposition: A | Discharge status: Home / Self Care | Payer: 59 | Attending: Obstetrics and Gynecology | Admitting: Obstetrics and Gynecology

## 2020-09-14 ENCOUNTER — Encounter
Admission: EM | Disposition: A | Discharge status: Home / Self Care | Payer: Self-pay | Source: Home / Self Care | Attending: Internal Medicine

## 2020-09-14 DIAGNOSIS — T18128A Food in esophagus causing other injury, initial encounter: Principal | ICD-10-CM | POA: Diagnosis present

## 2020-09-14 DIAGNOSIS — Z79899 Other long term (current) drug therapy: Secondary | ICD-10-CM | POA: Diagnosis not present

## 2020-09-14 DIAGNOSIS — R1319 Other dysphagia: Secondary | ICD-10-CM | POA: Diagnosis not present

## 2020-09-14 DIAGNOSIS — T426X5A Adverse effect of other antiepileptic and sedative-hypnotic drugs, initial encounter: Secondary | ICD-10-CM | POA: Diagnosis present

## 2020-09-14 DIAGNOSIS — J189 Pneumonia, unspecified organism: Secondary | ICD-10-CM | POA: Diagnosis not present

## 2020-09-14 DIAGNOSIS — E871 Hypo-osmolality and hyponatremia: Secondary | ICD-10-CM | POA: Diagnosis not present

## 2020-09-14 DIAGNOSIS — Z833 Family history of diabetes mellitus: Secondary | ICD-10-CM

## 2020-09-14 DIAGNOSIS — R1310 Dysphagia, unspecified: Secondary | ICD-10-CM

## 2020-09-14 DIAGNOSIS — E739 Lactose intolerance, unspecified: Secondary | ICD-10-CM | POA: Diagnosis present

## 2020-09-14 DIAGNOSIS — F840 Autistic disorder: Secondary | ICD-10-CM | POA: Diagnosis present

## 2020-09-14 DIAGNOSIS — J690 Pneumonitis due to inhalation of food and vomit: Secondary | ICD-10-CM | POA: Diagnosis not present

## 2020-09-14 DIAGNOSIS — R059 Cough, unspecified: Secondary | ICD-10-CM | POA: Diagnosis not present

## 2020-09-14 DIAGNOSIS — K222 Esophageal obstruction: Secondary | ICD-10-CM | POA: Diagnosis not present

## 2020-09-14 DIAGNOSIS — R0602 Shortness of breath: Secondary | ICD-10-CM | POA: Diagnosis not present

## 2020-09-14 DIAGNOSIS — Z20822 Contact with and (suspected) exposure to covid-19: Secondary | ICD-10-CM | POA: Diagnosis not present

## 2020-09-14 DIAGNOSIS — G809 Cerebral palsy, unspecified: Secondary | ICD-10-CM | POA: Diagnosis not present

## 2020-09-14 DIAGNOSIS — R0902 Hypoxemia: Secondary | ICD-10-CM | POA: Diagnosis present

## 2020-09-14 DIAGNOSIS — G40909 Epilepsy, unspecified, not intractable, without status epilepticus: Secondary | ICD-10-CM

## 2020-09-14 DIAGNOSIS — R000 Tachycardia, unspecified: Secondary | ICD-10-CM | POA: Diagnosis not present

## 2020-09-14 DIAGNOSIS — Z8249 Family history of ischemic heart disease and other diseases of the circulatory system: Secondary | ICD-10-CM

## 2020-09-14 DIAGNOSIS — R069 Unspecified abnormalities of breathing: Secondary | ICD-10-CM | POA: Diagnosis not present

## 2020-09-14 DIAGNOSIS — T18108A Unspecified foreign body in esophagus causing other injury, initial encounter: Secondary | ICD-10-CM | POA: Diagnosis not present

## 2020-09-14 DIAGNOSIS — R0689 Other abnormalities of breathing: Secondary | ICD-10-CM | POA: Diagnosis not present

## 2020-09-14 HISTORY — PX: ESOPHAGOGASTRODUODENOSCOPY (EGD) WITH PROPOFOL: SHX5813

## 2020-09-14 LAB — CBC WITH DIFFERENTIAL/PLATELET
Abs Immature Granulocytes: 0.01 10*3/uL (ref 0.00–0.07)
Basophils Absolute: 0 10*3/uL (ref 0.0–0.1)
Basophils Relative: 0 %
Eosinophils Absolute: 0.1 10*3/uL (ref 0.0–0.5)
Eosinophils Relative: 2 %
HCT: 37.4 % — ABNORMAL LOW (ref 39.0–52.0)
Hemoglobin: 13.9 g/dL (ref 13.0–17.0)
Immature Granulocytes: 0 %
Lymphocytes Relative: 7 %
Lymphs Abs: 0.5 10*3/uL — ABNORMAL LOW (ref 0.7–4.0)
MCH: 32.2 pg (ref 26.0–34.0)
MCHC: 37.2 g/dL — ABNORMAL HIGH (ref 30.0–36.0)
MCV: 86.6 fL (ref 80.0–100.0)
Monocytes Absolute: 0.9 10*3/uL (ref 0.1–1.0)
Monocytes Relative: 14 %
Neutro Abs: 4.7 10*3/uL (ref 1.7–7.7)
Neutrophils Relative %: 77 %
Platelets: 128 10*3/uL — ABNORMAL LOW (ref 150–400)
RBC: 4.32 MIL/uL (ref 4.22–5.81)
RDW: 12.1 % (ref 11.5–15.5)
WBC: 6.1 10*3/uL (ref 4.0–10.5)
nRBC: 0 % (ref 0.0–0.2)

## 2020-09-14 LAB — BASIC METABOLIC PANEL
Anion gap: 10 (ref 5–15)
BUN: 7 mg/dL (ref 6–20)
CO2: 26 mmol/L (ref 22–32)
Calcium: 9.5 mg/dL (ref 8.9–10.3)
Chloride: 92 mmol/L — ABNORMAL LOW (ref 98–111)
Creatinine, Ser: 0.51 mg/dL — ABNORMAL LOW (ref 0.61–1.24)
GFR, Estimated: >60 mL/min (ref >60)
Glucose, Bld: 75 mg/dL (ref 70–99)
Potassium: 3.8 mmol/L (ref 3.5–5.1)
Sodium: 128 mmol/L — ABNORMAL LOW (ref 135–145)

## 2020-09-14 LAB — RESP PANEL BY RT-PCR (FLU A&B, COVID) ARPGX2
Influenza A by PCR: NEGATIVE
Influenza B by PCR: NEGATIVE
SARS Coronavirus 2 by RT PCR: NEGATIVE

## 2020-09-14 SURGERY — ESOPHAGOGASTRODUODENOSCOPY (EGD) WITH PROPOFOL
Anesthesia: General

## 2020-09-14 MED ORDER — VALPROIC ACID 250 MG/5ML PO SOLN
500.0000 mg | Freq: Two times a day (BID) | ORAL | Status: DC
  Filled 2020-09-14 (×2): qty 10

## 2020-09-14 MED ORDER — TOPIRAMATE (TOPAMAX) NICU/PEDS ORAL SOLN 20 MG/ML
200.0000 mg | Freq: Two times a day (BID) | ORAL | Status: DC
  Filled 2020-09-14 (×3): qty 10

## 2020-09-14 MED ORDER — CLONAZEPAM 0.5 MG PO TABS: 0.5000 mg | ORAL_TABLET | Freq: Three times a day (TID) | ORAL | Status: DC

## 2020-09-14 MED ORDER — VALPROIC ACID 250 MG/5ML PO SOLN
500.0000 mg | Freq: Two times a day (BID) | ORAL | Status: DC
  Administered 2020-09-14 – 2020-09-15 (×3): 500 mg via ORAL
  Filled 2020-09-14 (×4): qty 10

## 2020-09-14 MED ORDER — GUANFACINE HCL 1 MG PO TABS
1.0000 mg | ORAL_TABLET | Freq: Every day | ORAL | Status: DC
  Administered 2020-09-14: 1 mg via ORAL
  Filled 2020-09-14 (×3): qty 1

## 2020-09-14 MED ORDER — PROPOFOL 10 MG/ML IV BOLUS
INTRAVENOUS | Status: DC | PRN
  Administered 2020-09-14: 150 mg via INTRAVENOUS

## 2020-09-14 MED ORDER — MEPERIDINE HCL 25 MG/ML IJ SOLN: 6.2500 mg | INTRAMUSCULAR | Status: DC | PRN

## 2020-09-14 MED ORDER — LIDOCAINE HCL (CARDIAC) PF 100 MG/5ML IV SOSY
PREFILLED_SYRINGE | INTRAVENOUS | Status: DC | PRN
  Administered 2020-09-14: 80 mg via INTRAVENOUS

## 2020-09-14 MED ORDER — FENTANYL CITRATE (PF) 100 MCG/2ML IJ SOLN
INTRAMUSCULAR | Status: AC
  Filled 2020-09-14: qty 2

## 2020-09-14 MED ORDER — ONDANSETRON HCL 4 MG/2ML IJ SOLN
INTRAMUSCULAR | Status: AC
  Filled 2020-09-14: qty 2

## 2020-09-14 MED ORDER — GLYCOPYRROLATE 0.2 MG/ML IJ SOLN
INTRAMUSCULAR | Status: AC
  Filled 2020-09-14: qty 1

## 2020-09-14 MED ORDER — ONDANSETRON HCL 4 MG/2ML IJ SOLN: 4.0000 mg | Freq: Once | INTRAMUSCULAR | Status: DC | PRN

## 2020-09-14 MED ORDER — SODIUM CHLORIDE 0.9 % IV BOLUS: 1000.0000 mL | Freq: Once | INTRAVENOUS | Status: DC

## 2020-09-14 MED ORDER — PROPOFOL 10 MG/ML IV BOLUS
INTRAVENOUS | Status: AC
  Filled 2020-09-14: qty 20

## 2020-09-14 MED ORDER — DEXAMETHASONE SODIUM PHOSPHATE 10 MG/ML IJ SOLN
INTRAMUSCULAR | Status: DC | PRN
  Administered 2020-09-14: 10 mg via INTRAVENOUS

## 2020-09-14 MED ORDER — PHENYLEPHRINE HCL (PRESSORS) 10 MG/ML IV SOLN
INTRAVENOUS | Status: DC | PRN
  Administered 2020-09-14 (×2): 100 ug via INTRAVENOUS
  Administered 2020-09-14 (×2): 200 ug via INTRAVENOUS

## 2020-09-14 MED ORDER — CLOBAZAM 2.5 MG/ML PO SUSP
15.0000 mg | Freq: Two times a day (BID) | ORAL | Status: DC
  Filled 2020-09-14 (×3): qty 8

## 2020-09-14 MED ORDER — SODIUM CHLORIDE 0.9 % IV SOLN
3.0000 g | Freq: Four times a day (QID) | INTRAVENOUS | Status: DC
  Administered 2020-09-14 – 2020-09-15 (×2): 3 g via INTRAVENOUS
  Filled 2020-09-14 (×3): qty 8
  Filled 2020-09-14: qty 3
  Filled 2020-09-14: qty 8
  Filled 2020-09-14: qty 3

## 2020-09-14 MED ORDER — DEXTROSE-NACL 5-0.9 % IV SOLN: INTRAVENOUS | Status: DC

## 2020-09-14 MED ORDER — LACTATED RINGERS IV SOLN: INTRAVENOUS | Status: DC

## 2020-09-14 MED ORDER — LEVOCARNITINE 1 GM/10ML PO SOLN
500.0000 mg | Freq: Two times a day (BID) | ORAL | Status: DC
  Administered 2020-09-14 – 2020-09-15 (×3): 500 mg via ORAL
  Filled 2020-09-14 (×5): qty 5

## 2020-09-14 MED ORDER — PHENYLEPHRINE HCL (PRESSORS) 10 MG/ML IV SOLN
INTRAVENOUS | Status: AC
  Filled 2020-09-14: qty 1

## 2020-09-14 MED ORDER — CLONAZEPAM 0.5 MG PO TABS
ORAL_TABLET | ORAL | Status: AC
  Administered 2020-09-14: 0.5 mg via ORAL
  Filled 2020-09-14: qty 1

## 2020-09-14 MED ORDER — OXCARBAZEPINE 300 MG/5ML PO SUSP
600.0000 mg | Freq: Two times a day (BID) | ORAL | Status: DC
  Filled 2020-09-14 (×2): qty 10

## 2020-09-14 MED ORDER — TOPIRAMATE 100 MG PO TABS
200.0000 mg | ORAL_TABLET | Freq: Two times a day (BID) | ORAL | Status: DC
  Administered 2020-09-14 – 2020-09-15 (×3): 200 mg via ORAL
  Filled 2020-09-14 (×4): qty 2

## 2020-09-14 MED ORDER — PROPOFOL 500 MG/50ML IV EMUL
INTRAVENOUS | Status: AC
  Filled 2020-09-14: qty 50

## 2020-09-14 MED ORDER — SODIUM CHLORIDE 0.9 % IV SOLN: INTRAVENOUS | Status: DC

## 2020-09-14 MED ORDER — SODIUM CHLORIDE 0.9 % IV SOLN: Freq: Once | INTRAVENOUS | Status: AC

## 2020-09-14 MED ORDER — IOHEXOL 350 MG/ML SOLN
60.0000 mL | Freq: Once | INTRAVENOUS | Status: AC | PRN
  Administered 2020-09-14: 60 mL via INTRAVENOUS

## 2020-09-14 MED ORDER — CLOBAZAM 2.5 MG/ML PO SUSP: 15.0000 mg | Freq: Two times a day (BID) | ORAL | Status: DC

## 2020-09-14 MED ORDER — LEVOCARNITINE 1 GM/10ML PO SOLN
500.0000 mg | Freq: Two times a day (BID) | ORAL | Status: DC
  Filled 2020-09-14 (×3): qty 5

## 2020-09-14 MED ORDER — FENTANYL CITRATE (PF) 100 MCG/2ML IJ SOLN: 25.0000 ug | INTRAMUSCULAR | Status: DC | PRN

## 2020-09-14 MED ORDER — LACTATED RINGERS IV BOLUS
1000.0000 mL | Freq: Once | INTRAVENOUS | Status: AC
  Administered 2020-09-14: 1000 mL via INTRAVENOUS

## 2020-09-14 MED ORDER — GUANFACINE HCL 1 MG PO TABS
1.0000 mg | ORAL_TABLET | Freq: Every day | ORAL | Status: DC
  Filled 2020-09-14: qty 1

## 2020-09-14 MED ORDER — LORAZEPAM 2 MG/ML IJ SOLN: 1.0000 mg | INTRAMUSCULAR | Status: DC | PRN

## 2020-09-14 MED ORDER — SUCCINYLCHOLINE CHLORIDE 200 MG/10ML IV SOSY
PREFILLED_SYRINGE | INTRAVENOUS | Status: DC | PRN
  Administered 2020-09-14: 10 mg via INTRAVENOUS

## 2020-09-14 MED ORDER — SUCCINYLCHOLINE CHLORIDE 200 MG/10ML IV SOSY
PREFILLED_SYRINGE | INTRAVENOUS | Status: AC
  Filled 2020-09-14: qty 10

## 2020-09-14 MED ORDER — CLONAZEPAM 0.5 MG PO TABS
0.5000 mg | ORAL_TABLET | Freq: Three times a day (TID) | ORAL | Status: DC
  Administered 2020-09-14 – 2020-09-15 (×2): 0.5 mg via ORAL
  Filled 2020-09-14 (×2): qty 1

## 2020-09-14 MED ORDER — ENOXAPARIN SODIUM 40 MG/0.4ML IJ SOSY
40.0000 mg | PREFILLED_SYRINGE | INTRAMUSCULAR | Status: DC
  Administered 2020-09-15: 40 mg via SUBCUTANEOUS
  Filled 2020-09-14: qty 0.4

## 2020-09-14 MED ORDER — ONDANSETRON HCL 4 MG/2ML IJ SOLN
INTRAMUSCULAR | Status: DC | PRN
  Administered 2020-09-14: 4 mg via INTRAVENOUS

## 2020-09-14 MED ORDER — PIPERACILLIN-TAZOBACTAM 3.375 G IVPB
3.3750 g | Freq: Once | INTRAVENOUS | Status: AC
  Administered 2020-09-14: 3.375 g via INTRAVENOUS
  Filled 2020-09-14: qty 50

## 2020-09-14 MED ORDER — CLOBAZAM 10 MG PO TABS
15.0000 mg | ORAL_TABLET | Freq: Two times a day (BID) | ORAL | Status: DC
  Administered 2020-09-14 – 2020-09-15 (×3): 15 mg via ORAL
  Filled 2020-09-14 (×5): qty 1.5

## 2020-09-14 MED ORDER — DEXAMETHASONE SODIUM PHOSPHATE 10 MG/ML IJ SOLN
INTRAMUSCULAR | Status: AC
  Filled 2020-09-14: qty 1

## 2020-09-14 MED ORDER — OXCARBAZEPINE 300 MG/5ML PO SUSP
600.0000 mg | Freq: Two times a day (BID) | ORAL | Status: DC
  Administered 2020-09-14 – 2020-09-15 (×3): 600 mg via ORAL
  Filled 2020-09-14 (×5): qty 10

## 2020-09-14 NOTE — Progress Notes (Signed)
Upper endoscopy done and per gastroenterologist he was able to remove the teabag that was impacted in the esophagus but patient also has an esophageal stricture and he recommends dilatation as an outpatient. He recommends to start patient on a soft diet. Will resume patient's pured diet and resume oral medications.

## 2020-09-14 NOTE — Anesthesia Preprocedure Evaluation (Signed)
Anesthesia Evaluation  Patient identified by MRN, date of birth, ID band Patient awake    Reviewed: Allergy & Precautions, H&P , NPO status , Patient's Chart, lab work & pertinent test results, reviewed documented beta blocker date and time   History of Anesthesia Complications Negative for: history of anesthetic complications  Airway Mallampati: IV  TM Distance: >3 FB Neck ROM: full  Mouth opening: Pediatric Airway  Dental  (+) Poor Dentition   Pulmonary neg pulmonary ROS, neg shortness of breath, asthma , neg sleep apnea, pneumonia, resolved, neg recent URI,    Pulmonary exam normal breath sounds clear to auscultation       Cardiovascular Exercise Tolerance: Good Normal cardiovascular exam+ dysrhythmias (PVCs)  Rhythm:regular Rate:Normal     Neuro/Psych Seizures - (Vagus nerve stimulator), Poorly Controlled,  PSYCHIATRIC DISORDERS Autismnegative psych ROS   GI/Hepatic negative GI ROS, Neg liver ROS, Foreign body in esophagus   Endo/Other  negative endocrine ROS  Renal/GU negative Renal ROS  negative genitourinary   Musculoskeletal negative musculoskeletal ROS (+)   Abdominal   Peds negative pediatric ROS (+)  Hematology negative hematology ROS (+)   Anesthesia Other Findings . Aspiration pneumonia (HCC)  . Autism  . Epilepsy (HCC)  . Epilepsy with status epilepticus (HCC)  . Intracranial arachnoid cysts  . Pes planovalgus  . Pneumonia 09/26/2015 . Premature ventricular contractions  . Respiratory failure (HCC)     Reproductive/Obstetrics negative OB ROS                             Anesthesia Physical  Anesthesia Plan  ASA: 3 and emergent  Anesthesia Plan: General ETT, Rapid Sequence and Cricoid Pressure   Post-op Pain Management:    Induction: Intravenous  PONV Risk Score and Plan: 2 and Propofol infusion  Airway Management Planned: Oral ETT and Video  Laryngoscope Planned  Additional Equipment:   Intra-op Plan:   Post-operative Plan: Extubation in OR  Informed Consent: I have reviewed the patients History and Physical, chart, labs and discussed the procedure including the risks, benefits and alternatives for the proposed anesthesia with the patient or authorized representative who has indicated his/her understanding and acceptance.     Dental Advisory Given  Plan Discussed with: Anesthesiologist, CRNA and Surgeon  Anesthesia Plan Comments:         Anesthesia Quick Evaluation

## 2020-09-14 NOTE — ED Provider Notes (Signed)
[facility] Emergency Department Provider Note  ____________________________________________   Event Date/Time   First MD Initiated Contact with Patient 09/14/20 [phone]     (approximate)  I have reviewed the triage vital signs and the nursing notes.   HISTORY  Chief Complaint Cough    HPI Noah Marshall is a 25 y.o. male with history of autism, epilepsy status post vagal nerve stimulator who presents to the emergency department with concerns for aspiration.  History is obtained by patient's mother as patient is nonverbal.  She states around 10:30 PM last night he ate a tea bag.  She states that the tag was removed from it.  She states that he ate it before she can get it from him.  She states the tea bag was cool when he ate it.  He has been coughing since and been drooling.  No other known ingestions.  He has had history of aspiration before requiring mechanical ventilation.  She denies any vomiting.  No known fevers.  She has not been able to get him to take any of his medications by mouth since.  She is worried because he has not been able to take any of his seizure medicines tonight.  She states he has been drooling and spitting up which is how he presents normally when he has aspirated.        Past Medical History:  Diagnosis Date   Aspiration pneumonia (HCC)    Autism    Epilepsy (HCC)    Epilepsy with status epilepticus (HCC)    Intracranial arachnoid cysts    Pes planovalgus    Pneumonia 09/26/2015   Premature ventricular contractions    Respiratory failure [facility])     Patient Active Problem List   Diagnosis Date Noted   Hyponatremia 09/26/2015   Epilepsy (HCC) 09/13/2015   Swallowing problem 12/20/2012   Intracranial arachnoid cysts 09/02/2011   Autistic disorder 09/02/2011    Past Surgical History:  Procedure Laterality Date   FOREIGN BODY REMOVAL N/A 03/08/2015   Procedure: FOREIGN BODY REMOVAL;  Surgeon: Elnita Maxwell, MD;   Location: [facility] ENDOSCOPY;  Service: Endoscopy;  Laterality: N/A;   IMPLANTATION VAGAL NERVE STIMULATOR  2011   REPLACE / REVISE VAGAL NERVE STIMULATOR      Prior to Admission medications   Medication Sig Start Date End Date Taking? Authorizing Provider  cetirizine HCl (ZYRTEC) 1 MG/ML solution Take 15 mg by mouth daily.    [provider]  cloBAZam (ONFI) 2.5 MG/ML solution SHAKE WELL AND GIVE 6 ML BY MOUTH TWICE DAILY 07/19/20     cloBAZam (ONFI) 2.5 MG/ML solution SHAKE WELL AND GIVE 6 ML BY MOUTH TWICE DAILY 08/29/20     clonazePAM (KLONOPIN) 0.5 MG tablet TAKE 1 TABLET BY MOUTH 3 TIMES DAILY. 02/15/20 08/13/20    clonazePAM (KLONOPIN) 0.5 MG tablet TAKE 1 TABLET BY MOUTH 3 TIMES DAILY. 08/17/20     fluticasone (FLONASE) 50 MCG/ACT nasal spray Place 2 sprays into both nostrils daily.    [provider]  guanFACINE (TENEX) 1 MG tablet TAKE 1 TABLET BY G-TUBE 2 TIMES DAILY 07/13/19 07/12/20  Phylis Bougie, MD  guanFACINE (TENEX) 1 MG tablet TAKE 1 TABLET BY G-TUBE 2 TIMES DAILY 08/19/20     levOCARNitine (CARNITOR) 1 GM/10ML solution TAKE 5 ML BY MOUTH TWO TIMES DAILY 12/06/19 12/05/20  Phylis Bougie, MD  levOCARNitine (CARNITOR) 1 GM/10ML solution Take 5 mLs (500 mg total) by mouth 2 (two) times daily  08/29/20     montelukast (SINGULAIR) 10 MG tablet Take 10 mg by mouth at bedtime.    [provider]  OXcarbazepine (TRILEPTAL) 300 MG/5ML suspension Take 600 mg by mouth 2 (two) times daily.     [provider]  topiramate (TOPAMAX) 200 MG tablet Place 200 mg into feeding tube 2 (two) times daily.     [provider]  topiramate (TOPAMAX) 200 MG tablet TAKE 1 TABLET BY MOUTH TWICE DAILY, CRUSHED. NO MORE G-TUBE (3 YEARS). 01/12/20 01/11/21  Phylis Bougie, MD  topiramate (TOPAMAX) 200 MG tablet Take 1 tablet (200 mg total) by mouth 2 (two) times daily Oral, crushed. No more g-tube (3 years). 08/29/20     TRILEPTAL 300 MG/5ML suspension TAKE 10 MLS BY MOUTH  2 TIMES DAILY 01/12/20 01/11/21  Phylis Bougie, MD  TRILEPTAL 300 MG/5ML suspension Take 10 mLs (600 mg total) by mouth 2 (two) times daily 08/29/20     valproic acid (DEPAKENE) 250 MG/5ML solution TAKE 10 MLS BY MOUTH 2 TIMES DAILY 12/29/19 12/28/20  White, Susannah, PA-C  valproic acid (DEPAKENE) 250 MG/5ML solution Take 10 mLs (500 mg total) by mouth 2 (two) times daily 08/29/20     Vitamin D, Ergocalciferol, (DRISDOL) 50000 units CAPS capsule Take 1 capsule (50,000 Units total) by mouth every 7 (seven) days. 02/19/17   Shaune Pollack, MD    Allergies Lactose intolerance (gi) and Peanut-containing drug products  Family History  Problem Relation Age of Onset   Diabetes Mellitus II Father    Hypertension Father     Social History Social History   Tobacco Use   Smoking status: Never   Smokeless tobacco: Never  Substance Use Topics   Alcohol use: No   Drug use: No    Review of Systems Level 5 caveat secondary to patient being nonverbal  ____________________________________________   PHYSICAL EXAM:  VITAL SIGNS: ED Triage Vitals  Enc Vitals Group     BP 09/14/20 0605 106/63     Pulse Rate 09/14/20 0605 (!) 112     Resp 09/14/20 0605 (!) 22     Temp 09/14/20 0605 99.4 F (37.4 C)     Temp Source 09/14/20 0605 Rectal     SpO2 09/14/20 0605 91 %     Weight 09/14/20 0620 136 lb 11 oz (62 kg)     Height 09/14/20 0620 5\' 6"  (1.676 m)     Head Circumference --      Peak Flow --      Pain Score --      Pain Loc --      Pain Edu? --      Excl. in GC? --    CONSTITUTIONAL: Alert and awake.  Does not appear to be in distress. Rectal temp 99.4. HEAD: Normocephalic, atraumatic EYES: Conjunctivae clear, pupils appear equal, EOM appear intact ENT: normal nose; moist mucous membranes, drooling, no stridor, nonverbal, I am unable to see into his posterior oropharynx despite using a tongue depressor, no lesions noted on the palate or tongue NECK: Supple, normal ROM CARD: Regular and  tachycardic; S1 and S2 appreciated; no murmurs, no clicks, no rubs, no gallops RESP: Normal chest excursion without splinting or tachypnea; breath sounds clear and equal bilaterally; no wheezes, no rhonchi, no rales, no hypoxia or respiratory distress, speaking full sentences ABD/GI: Normal bowel sounds; non-distended; soft, non-tender, no rebound, no guarding, no peritoneal signs, no hepatosplenomegaly BACK: The back appears normal EXT: Normal ROM in all joints; no  deformity noted, no edema; no cyanosis SKIN: Normal color for age and race; warm; no rash on exposed skin NEURO: Contracted extremities.  Nonverbal at baseline.   ____________________________________________   LABS (all labs ordered are listed, but only abnormal results are displayed)  Labs Reviewed  CBC WITH DIFFERENTIAL/PLATELET - Abnormal; Notable for the following components:      Result Value   HCT 37.4 (*)    MCHC 37.2 (*)    Platelets 128 (*)    Lymphs Abs 0.5 (*)    All other components within normal limits  BASIC METABOLIC PANEL - Abnormal; Notable for the following components:   Sodium 128 (*)    Chloride 92 (*)    Creatinine, Ser 0.51 (*)    All other components within normal limits  RESP PANEL BY RT-PCR (FLU A&B, COVID) ARPGX2  CULTURE, BLOOD (ROUTINE X 2)  CULTURE, BLOOD (ROUTINE X 2)   ____________________________________________  EKG   EKG Interpretation  Date/Time:  Thursday September 14 2020 06:09:55 EDT Ventricular Rate:  117 PR Interval:    QRS Duration: 106 QT Interval:  310 QTC Calculation: 433 R Axis:   17 Text Interpretation: Sinus tachycardia Multiple ventricular premature complexes Anteroseptal infarct, age indeterminate Artifact in lead(s) I II aVR aVL V1 V2 Confirmed by Rochele RaringWard, Zimri Brennen [phone]([id number]) on 09/14/2020 7:00:16 AM        ____________________________________________  RADIOLOGY Normajean BaxterI, Teo Moede, personally viewed and evaluated these images (plain radiographs) as part of my medical  decision making, as well as reviewing the written report by the radiologist.  ED MD interpretation: Chest x-ray shows right middle and lower lobe pneumonia.  Official radiology report(s): DG Chest 2 View  Result Date: 09/14/2020 CLINICAL DATA:  Aspiration. EXAM: CHEST - 2 VIEW COMPARISON:  Chest x-ray dated April 22, 2020. FINDINGS: Vagal nerve stimulator again noted. The heart size and mediastinal contours are within normal limits. Patchy consolidation in the right middle and lower lobes. No pleural effusion or pneumothorax. No acute osseous abnormality. IMPRESSION: 1. Right middle and lower lobe pneumonia. Electronically Signed   By: Obie DredgeWilliam T Derry M.D.   On: 09/14/2020 07:17    ____________________________________________   PROCEDURES  Procedure(s) performed (including Critical Care):  Procedures    ____________________________________________   INITIAL IMPRESSION / ASSESSMENT AND PLAN / ED COURSE  As part of my medical decision making, I reviewed the following data within the electronic MEDICAL RECORD NUMBER History obtained from family, Nursing notes reviewed and incorporated, Labs reviewed , EKG interpreted , Old EKG reviewed, Old chart reviewed, Patient signed out to Dr. Penne LashIssacs, Radiograph reviewed , and Notes from prior ED visits         Patient here with concerns for aspiration.  Mother reports that he ate a teabag around 10:30 PM last night.  She states that this is happened to him before and they no longer put him in depends because he has eaten a piece of his depends diaper before.  He is aspirated previously requiring mechanical ventilation.  Intermittently hypoxic with EMS but satting well on room air currently and in no respiratory distress.  Will obtain labs, chest x-ray.  EKG is nonischemic.  He is tachycardic but afebrile.  ED PROGRESS  Chest x-ray concerning for right middle and upper lobe pneumonia.  Will give antibiotics.  COVID and flu negative.  Labs  reassuring.  Patient continues to have drooling and is not tolerating anything by mouth.  He seems to be spitting out his secretions and I have not  noticed any further episodes of aspiration.  He is not vomiting.  Mother reports this is what he presents like normally with aspiration.  I am not able to get a good view of his posterior oropharynx given his history of autism.  We will obtain a CT of his neck to rule out other causes of drooling including epiglottitis, PTA, uvulitis, deep space neck infection.  Mother agrees to this plan.  Signed out to Dr. Erma Heritage to follow-up on CT imaging and admit patient.  I reviewed all nursing notes and pertinent previous records as available.  I have reviewed and interpreted any EKGs, lab and urine results, imaging (as available).  ____________________________________________   FINAL CLINICAL IMPRESSION(S) / ED DIAGNOSES  Final diagnoses:  Aspiration pneumonia of right lung, unspecified aspiration pneumonia type, unspecified part of lung [facility])     ED Discharge Orders     None       *Please note:  Brownie A Haffey was evaluated in Emergency Department on 09/14/2020 for the symptoms described in the history of present illness. He was evaluated in the context of the global COVID-19 pandemic, which necessitated consideration that the patient might be at risk for infection with the SARS-CoV-2 virus that causes COVID-19. Institutional protocols and algorithms that pertain to the evaluation of patients at risk for COVID-19 are in a state of rapid change based on information released by regulatory bodies including the CDC and federal and state organizations. These policies and algorithms were followed during the patient's care in the ED.  Some ED evaluations and interventions may be delayed as a result of limited staffing during and the pandemic.*   Note:  This document was prepared using Dragon voice recognition software and may include unintentional dictation  errors.    Rilee Knoll, Layla Maw, DO 09/14/20 [phone]

## 2020-09-14 NOTE — ED Triage Notes (Signed)
From home by EMS; patient's mother reports that patient ate a tea bag, and started drooling and coughing

## 2020-09-14 NOTE — Consult Note (Signed)
PHARMACY -  BRIEF ANTIBIOTIC NOTE   Pharmacy has received consult(s) for Zosyn from an ED provider.  The patient's profile has been reviewed for ht/wt/allergies/indication/available labs.    One time order(s) placed for Zosyn 3.375g IV x 1  Further antibiotics/pharmacy consults should be ordered by admitting physician if indicated.                       Thank you,  Albina Billet, PharmD, BCPS Clinical Pharmacist 09/14/2020 7:26 AM

## 2020-09-14 NOTE — Consult Note (Signed)
Pharmacy Antibiotic Note  Noah Marshall is a 25 y.o. male admitted on 09/14/2020 with pneumonia.  Pharmacy has been consulted for Unasyn dosing. Patient received a 1 time dose of Zosyn in ED.  Plan: Unasyn 3g IV q6 hours  Height: 5\' 6"  (167.6 cm) Weight: 62 kg (136 lb 11 oz) IBW/kg (Calculated) : 63.8  Temp (24hrs), Avg:99.4 F (37.4 C), Min:99.4 F (37.4 C), Max:99.4 F (37.4 C)  Recent Labs  Lab 09/14/20 0629  WBC 6.1  CREATININE 0.51*    Estimated Creatinine Clearance: 123.8 mL/min (A) (by C-G formula based on SCr of 0.51 mg/dL (L)).    Allergies  Allergen Reactions   Lactose Intolerance (Gi) Diarrhea   Peanut-Containing Drug Products Hives    Antimicrobials this admission: Unasyn 8/18 >>  Zosyn 8/17 x 1  Microbiology results: 8/18 BCx: pending   Thank you for allowing pharmacy to be a part of this patient's care.  Tzivia Oneil A Bill Mcvey 09/14/2020 10:44 AM

## 2020-09-14 NOTE — ED Notes (Signed)
Lab contacted to obtain second set of blood cultures

## 2020-09-14 NOTE — ED Notes (Signed)
Patient transported to X-ray 

## 2020-09-14 NOTE — Op Note (Signed)
[facility] Gastroenterology Patient Name: Noah Marshall Procedure Date: 09/14/2020 11:41 AM MRN: [id number] Account #: [id number] Date of Birth: 1995/03/10 Admit Type: Inpatient Age: 25 Room: [facility] ENDO ROOM 3 Gender: Male Note Status: Finalized Procedure:             Upper GI endoscopy Indications:           Foreign body in the esophagus Providers:             Wyline Mood MD, MD Referring MD:          Demetrios Isaacs. Sherrie Mustache, MD (Referring MD) Medicines:             General Anesthesia Complications:         No immediate complications. Procedure:             Pre-Anesthesia Assessment:                        - Prior to the procedure, a History and Physical was                         performed, and patient medications, allergies and                         sensitivities were reviewed. The patient's tolerance                         of previous anesthesia was reviewed.                        - The risks and benefits of the procedure and the                         sedation options and risks were discussed with the                         patient. All questions were answered and informed                         consent was obtained.                        - ASA Grade Assessment: III - A patient with severe                         systemic disease.                        After obtaining informed consent, the endoscope was                         passed under direct vision. Throughout the procedure,                         the patient's blood pressure, pulse, and oxygen                         saturations were monitored continuously. The Endoscope                         was introduced through the  mouth, and advanced to the                         third part of duodenum. The upper GI endoscopy was                         accomplished with ease. The patient tolerated the                         procedure well. Findings:      Food was found in the upper third of the esophagus.  Removal was       accomplished with a Raptor grasping device.      One benign-appearing, intrinsic severe (stenosis; an endoscope cannot       pass) stenosis was found in the upper third of the esophagus. This       stenosis measured 9 mm (inner diameter) x 1 cm (in length). The stenosis       was traversed after downsizing scope.      The stomach was normal.      The cardia and gastric fundus were normal on retroflexion.      The examined duodenum was normal.      1 tea bag extracted out from esophagus Impression:            - Food was found in the esophagus. Removal was                         successful.                        - Benign-appearing esophageal stenosis.                        - Normal stomach.                        - Normal examined duodenum. Recommendation:        - Return patient to hospital ward for ongoing care.                        - commence on clears                        - If has history of dysphagia may benefit from                         dilation of esophageal stricture as an out patient Procedure Code(s):     --- Professional ---                        [phone], Esophagogastroduodenoscopy, flexible,                         transoral; with removal of foreign body(s) Diagnosis Code(s):     --- Professional ---                        E93.810F, Food in esophagus causing other injury,                         initial encounter  K22.2, Esophageal obstruction                        T18.108A, Unspecified foreign body in esophagus                         causing other injury, initial encounter CPT copyright 2019 [employer] Medical Association. All rights reserved. The codes documented in this report are preliminary and upon coder review may  be revised to meet current compliance requirements. Wyline Mood, MD Wyline Mood MD, MD 09/14/2020 12:10:21 PM This report has been signed electronically. Number of Addenda: 0 Note Initiated On: 09/14/2020  11:41 AM Estimated Blood Loss:  Estimated blood loss: none.      [facility]

## 2020-09-14 NOTE — ED Notes (Signed)
Pt desatting on 6L via [place] to 90%. Oxygen requirements discussed with MD Erma Heritage. Pt sats at 92%. Transported to endo at this time.

## 2020-09-14 NOTE — ED Provider Notes (Signed)
Assumed care.  Briefly, patient is a 25 year old male here with aspiration pneumonia in the setting of eating a teabag.  Clinically, patient drooling so concern for possible retained foreign body.  CT scan was obtained.  CT scan reviewed, shows probable food impaction in the cervical esophagus.  I discussed this with GI Dr. Tobi Bastos, will admit to medicine with GI consult for impaction removal.   Shaune Pollack, MD 09/14/20 0930

## 2020-09-14 NOTE — Anesthesia Postprocedure Evaluation (Signed)
Anesthesia Post Note  Patient: Noah Marshall  Procedure(s) Performed: ESOPHAGOGASTRODUODENOSCOPY (EGD) WITH PROPOFOL  Patient location during evaluation: PACU Anesthesia Type: General Level of consciousness: awake and alert, awake and oriented Pain management: pain level controlled Vital Signs Assessment: post-procedure vital signs reviewed and stable Respiratory status: spontaneous breathing, nonlabored ventilation and respiratory function stable Cardiovascular status: blood pressure returned to baseline and stable Postop Assessment: no apparent nausea or vomiting Anesthetic complications: no   No notable events documented.   Last Vitals:  Vitals:   09/14/20 1245 09/14/20 1257  BP: 108/60   Pulse: 92 (!) 31  Resp: 20 17  Temp:    SpO2: 93% 92%    Last Pain:  Vitals:   09/14/20 1245  TempSrc:   PainSc: 0-No pain                 Manfred Arch

## 2020-09-14 NOTE — Plan of Care (Signed)
  Problem: Education: Goal: Knowledge of General Education information will improve Description Including pain rating scale, medication(s)/side effects and non-pharmacologic comfort measures Outcome: Progressing   

## 2020-09-14 NOTE — Progress Notes (Signed)
   09/14/20 2033  Assess: MEWS Score  Temp 98.2 F (36.8 C)  BP 123/75  ECG Heart Rate (!) 118  Resp 20  SpO2 98 %  Assess: MEWS Score  MEWS Temp 0  MEWS Systolic 0  MEWS Pulse 2  MEWS RR 0  MEWS LOC 0  MEWS Score 2  MEWS Score Color Yellow  Assess: if the MEWS score is Yellow or Red  Were vital signs taken at a resting state? Yes  Focused Assessment No change from prior assessment  Does the patient meet 2 or more of the SIRS criteria? No  MEWS guidelines implemented *See Row Information* No, previously yellow, continue vital signs every 4 hours  Treat  MEWS Interventions Administered scheduled meds/treatments  Pain Scale 0-10  Pain Score 0  Escalate  MEWS: Escalate Yellow: discuss with charge nurse/RN and consider discussing with provider and RRT  Notify: Charge Nurse/RN  Name of Charge Nurse/RN Notified Darien Ramus, RN  Date Charge Nurse/RN Notified 09/14/20  Time Charge Nurse/RN Notified 2042  Assess: SIRS CRITERIA  SIRS Temperature  0  SIRS Pulse 1  SIRS Respirations  0  SIRS WBC 0  SIRS Score Sum  1

## 2020-09-14 NOTE — H&P (Signed)
History and Physical    Noah Marshall KNL:[id number] DOB: 06/06/1995 DOA: 09/14/2020  PCP: Malva Limes, MD   Patient coming from: Home  I have personally briefly reviewed patient's old medical records in [facility]  Chief Complaint: Difficulty swallowing  Most of the history was obtained from patient's mom at the bedside since patient is nonverbal. HPI: Noah Marshall is a 25 y.o. male with medical history significant for autism, epilepsy status post vagal nerve stimulator who presents to the emergency room for evaluation with concerns for possible aspiration. Patient's mother states that at around 10:30 PM last night patient ate a tea bag.  She monitored him overnight and noticed that he had been drooling and also had a cough.  She checked his pulse oximetry through the night and it stayed within normal limits greater than 92%.  She states that she had attempted to administer his antiepileptic medications but he spit it out. Due to the persistent drooling, difficulty swallowing and spitting up she was concerned he may have aspirated and so he was brought to the ER for further evaluation. Patient has had a prior episode of aspiration in the past requiring mechanical ventilation. I am unable to do review of systems on this patient due to his mental status. Labs show sodium 128, potassium 3.8, chloride 92, bicarb 26, glucose 75, BUN 7, creatinine 0.51, calcium 9.5, white count 6.1, hemoglobin 13.9, hematocrit 37.4, MCV 86.6, RDW 12.1, platelet count 128 Respiratory viral panel is negative CT scan of the neck with contrast shows probable food impaction in the cervical esophagus. Chest x-ray reviewed by me shows right middle and lower lobe pneumonia Twelve-lead EKG reviewed by me shows sinus tachycardia with multiple PVCs.   ED Course: Patient is a 25 year old male with a history of autism and epilepsy who is nonverbal.  He was brought into the ER for evaluation after he swallowed a  tea bag and has been drooling since then.  He also has a cough and dysphagia with liquids. He is hypoxic and had pulse oximetry of 88% requiring oxygen supplementation at 6 L to maintain pulse oximetry greater than 92% Chest x-ray shows right middle and lower lobe pneumonia and CT scan of the neck shows food impaction in the cervical esophagus. Gastroenterology has been consulted for foreign body disimpaction and patient received a dose of Zosyn in the ER. He will be admitted to the hospital for further evaluation.   Review of Systems: As per HPI otherwise all other systems reviewed and negative.    Past Medical History:  Diagnosis Date   Aspiration pneumonia (HCC)    Autism    Epilepsy (HCC)    Epilepsy with status epilepticus (HCC)    Intracranial arachnoid cysts    Pes planovalgus    Pneumonia 09/26/2015   Premature ventricular contractions    Respiratory failure (HCC)     Past Surgical History:  Procedure Laterality Date   FOREIGN BODY REMOVAL N/A 03/08/2015   Procedure: FOREIGN BODY REMOVAL;  Surgeon: Elnita Maxwell, MD;  Location: [facility] ENDOSCOPY;  Service: Endoscopy;  Laterality: N/A;   IMPLANTATION VAGAL NERVE STIMULATOR  2011   REPLACE / REVISE VAGAL NERVE STIMULATOR       reports that he has never smoked. He has never used smokeless tobacco. He reports that he does not drink alcohol and does not use drugs.  Allergies  Allergen Reactions   Lactose Intolerance (Gi) Diarrhea   Peanut-Containing Drug Products Hives    Family  History  Problem Relation Age of Onset   Diabetes Mellitus II Father    Hypertension Father       Prior to Admission medications   Medication Sig Start Date End Date Taking? Authorizing Provider  cetirizine HCl (ZYRTEC) 1 MG/ML solution Take 15 mg by mouth daily.    [provider]  cloBAZam (ONFI) 2.5 MG/ML solution SHAKE WELL AND GIVE 6 ML BY MOUTH TWICE DAILY 07/19/20     cloBAZam (ONFI) 2.5 MG/ML solution SHAKE WELL AND GIVE 6  ML BY MOUTH TWICE DAILY 08/29/20     clonazePAM (KLONOPIN) 0.5 MG tablet TAKE 1 TABLET BY MOUTH 3 TIMES DAILY. 02/15/20 08/13/20    clonazePAM (KLONOPIN) 0.5 MG tablet TAKE 1 TABLET BY MOUTH 3 TIMES DAILY. 08/17/20     fluticasone (FLONASE) 50 MCG/ACT nasal spray Place 2 sprays into both nostrils daily.    [provider]  guanFACINE (TENEX) 1 MG tablet TAKE 1 TABLET BY G-TUBE 2 TIMES DAILY 07/13/19 07/12/20  Phylis Bougieran, Tung Thanh, MD  guanFACINE (TENEX) 1 MG tablet TAKE 1 TABLET BY G-TUBE 2 TIMES DAILY 08/19/20     levOCARNitine (CARNITOR) 1 GM/10ML solution TAKE 5 ML BY MOUTH TWO TIMES DAILY 12/06/19 12/05/20  Phylis Bougieran, Tung Thanh, MD  levOCARNitine (CARNITOR) 1 GM/10ML solution Take 5 mLs (500 mg total) by mouth 2 (two) times daily 08/29/20     montelukast (SINGULAIR) 10 MG tablet Take 10 mg by mouth at bedtime.    [provider]  OXcarbazepine (TRILEPTAL) 300 MG/5ML suspension Take 600 mg by mouth 2 (two) times daily.     [provider]  topiramate (TOPAMAX) 200 MG tablet Place 200 mg into feeding tube 2 (two) times daily.     [provider]  topiramate (TOPAMAX) 200 MG tablet TAKE 1 TABLET BY MOUTH TWICE DAILY, CRUSHED. NO MORE G-TUBE (3 YEARS). 01/12/20 01/11/21  Phylis Bougieran, Tung Thanh, MD  topiramate (TOPAMAX) 200 MG tablet Take 1 tablet (200 mg total) by mouth 2 (two) times daily Oral, crushed. No more g-tube (3 years). 08/29/20     TRILEPTAL 300 MG/5ML suspension TAKE 10 MLS BY MOUTH 2 TIMES DAILY 01/12/20 01/11/21  Phylis Bougieran, Tung Thanh, MD  TRILEPTAL 300 MG/5ML suspension Take 10 mLs (600 mg total) by mouth 2 (two) times daily 08/29/20     valproic acid (DEPAKENE) 250 MG/5ML solution TAKE 10 MLS BY MOUTH 2 TIMES DAILY 12/29/19 12/28/20  White, Susannah, PA-C  valproic acid (DEPAKENE) 250 MG/5ML solution Take 10 mLs (500 mg total) by mouth 2 (two) times daily 08/29/20     Vitamin D, Ergocalciferol, (DRISDOL) 50000 units CAPS capsule Take 1 capsule (50,000 Units total) by mouth every 7  (seven) days. 02/19/17   Shaune Pollackhen, Qing, MD    Physical Exam: Vitals:   09/14/20 0613 09/14/20 0620 09/14/20 0630 09/14/20 0930  BP: 106/63  109/62 113/70  Pulse: (!) 114   (!) 116  Resp: (!) 23  20 19   Temp:      TempSrc:      SpO2: 94%  97% 92%  Weight:  62 kg    Height:  5\' 6"  (1.676 m)       Vitals:   09/14/20 0613 09/14/20 0620 09/14/20 0630 09/14/20 0930  BP: 106/63  109/62 113/70  Pulse: (!) 114   (!) 116  Resp: (!) 23  20 19   Temp:      TempSrc:      SpO2: 94%  97% 92%  Weight:  62 kg  Height:  5\' 6"  (1.676 m)        Constitutional: Awake and alert.  Drooling.  Not in any apparent distress HEENT:      Head: Normocephalic and atraumatic.         Eyes: PERLA, EOMI, Conjunctivae are normal. Sclera is non-icteric.       Mouth/Throat: Mucous membranes are moist.       Neck: Supple with no signs of meningismus. Cardiovascular: Tachycardic. No murmurs, gallops, or rubs. 2+ symmetrical distal pulses are present . No JVD. No LE edema Respiratory: Respiratory rate within normal limits. rhonchi in the right middle to lower lung fields.  Left lung is clear.  No wheezes or crackles Gastrointestinal: Soft, non tender, and non distended with positive bowel sounds.  Genitourinary: No CVA tenderness. Musculoskeletal: Nontender with normal range of motion in all extremities. No cyanosis, or erythema of extremities. Neurologic:  Face is symmetric. Moving all extremities. No gross focal neurologic deficits . Skin: Skin is warm, dry.  No rash or ulcers Psychiatric: Mood and affect are normal    Labs on Admission: I have personally reviewed following labs and imaging studies  CBC: Recent Labs  Lab 09/14/20 0629  WBC 6.1  NEUTROABS 4.7  HGB 13.9  HCT 37.4*  MCV 86.6  PLT 128*   Basic Metabolic Panel: Recent Labs  Lab 09/14/20 0629  NA 128*  K 3.8  CL 92*  CO2 26  GLUCOSE 75  BUN 7  CREATININE 0.51*  CALCIUM 9.5   GFR: Estimated Creatinine Clearance: 123.8  mL/min (A) (by C-G formula based on SCr of 0.51 mg/dL (L)). Liver Function Tests: No results for input(s): AST, ALT, ALKPHOS, BILITOT, PROT, ALBUMIN in the last 168 hours. No results for input(s): LIPASE, AMYLASE in the last 168 hours. No results for input(s): AMMONIA in the last 168 hours. Coagulation Profile: No results for input(s): INR, PROTIME in the last 168 hours. Cardiac Enzymes: No results for input(s): CKTOTAL, CKMB, CKMBINDEX, TROPONINI in the last 168 hours. BNP (last 3 results) No results for input(s): PROBNP in the last 8760 hours. HbA1C: No results for input(s): HGBA1C in the last 72 hours. CBG: No results for input(s): GLUCAP in the last 168 hours. Lipid Profile: No results for input(s): CHOL, HDL, LDLCALC, TRIG, CHOLHDL, LDLDIRECT in the last 72 hours. Thyroid Function Tests: No results for input(s): TSH, T4TOTAL, FREET4, T3FREE, THYROIDAB in the last 72 hours. Anemia Panel: No results for input(s): VITAMINB12, FOLATE, FERRITIN, TIBC, IRON, RETICCTPCT in the last 72 hours. Urine analysis:    Component Value Date/Time   COLORURINE YELLOW (A) 04/22/2020 0716   APPEARANCEUR HAZY (A) 04/22/2020 0716   APPEARANCEUR Clear 12/27/2012 0012   LABSPEC 1.021 04/22/2020 0716   LABSPEC 1.017 12/27/2012 0012   PHURINE 7.0 04/22/2020 0716   GLUCOSEU NEGATIVE 04/22/2020 0716   GLUCOSEU Negative 12/27/2012 0012   HGBUR NEGATIVE 04/22/2020 0716   BILIRUBINUR NEGATIVE 04/22/2020 0716   BILIRUBINUR Negative 12/27/2012 0012   KETONESUR NEGATIVE 04/22/2020 0716   PROTEINUR NEGATIVE 04/22/2020 0716   NITRITE NEGATIVE 04/22/2020 0716   LEUKOCYTESUR NEGATIVE 04/22/2020 0716   LEUKOCYTESUR Negative 12/27/2012 0012    Radiological Exams on Admission: DG Chest 2 View  Result Date: 09/14/2020 CLINICAL DATA:  Aspiration. EXAM: CHEST - 2 VIEW COMPARISON:  Chest x-ray dated April 22, 2020. FINDINGS: Vagal nerve stimulator again noted. The heart size and mediastinal contours are within  normal limits. Patchy consolidation in the right middle and lower lobes. No pleural effusion or  pneumothorax. No acute osseous abnormality. IMPRESSION: 1. Right middle and lower lobe pneumonia. Electronically Signed   By: Obie Dredge M.D.   On: 09/14/2020 07:17   CT Soft Tissue Neck W Contrast  Result Date: 09/14/2020 CLINICAL DATA:  Drooling and coughing after ingesting AT back. EXAM: CT NECK WITH CONTRAST TECHNIQUE: Multidetector CT imaging of the neck was performed using the standard protocol following the bolus administration of intravenous contrast. CONTRAST:  7mL OMNIPAQUE IOHEXOL 350 MG/ML SOLN COMPARISON:  03/08/2015 neck radiograph FINDINGS: Pharynx and larynx: Pharynx and larynx are negative for swelling, visible injury, or visible inflammation. In the upper cervical esophagus is a elongated area of solid appearing material that contains internal gas. This structure measures 5 cm in length by 1.2 Cm in thickness. Superiorly there is an associated tiny high-density area that is not typical of a staple. No underlying esophageal wall thickening. Salivary glands: No inflammation, mass, or stone. Thyroid: Normal. Lymph nodes: None enlarged or abnormal density. Vascular: Vagal nerve stimulator on the left. Limited intracranial: Negative Visualized orbits: Negative Mastoids and visualized paranasal sinuses: Clear Skeleton: Negative Upper chest: Negative IMPRESSION: Probable food impaction in the cervical esophagus. Electronically Signed   By: Marnee Spring M.D.   On: 09/14/2020 08:50     Assessment/Plan Principal Problem:   Aspiration pneumonia (HCC) Active Problems:   Epilepsy (HCC)   Hyponatremia   Dysphagia   Autistic disorder    Aspiration pneumonia with acute respiratory failure  Following foreign body ingestion Patient has right middle and lower lobe pneumonia.  He was hypoxic with room air pulse oximetry of 88%. Keep head of bed elevated Place patient on Unasyn Gastroenterology  consult for disimpaction of foreign body Continue oxygen supplementation at 6 L to maintain pulse oximetry greater than 92%    Epilepsy Place patient on seizure precautions Hold all oral medications for now IV Ativan as needed for seizure    Hyponatremia Most likely secondary to poor oral intake Gentle IV fluid hydration     DVT prophylaxis: Lovenox  Code Status: full code  Family Communication: Greater than 50% of time was spent discussing patient's condition and plan of care with his mother at the bedside.  All questions and concerns have been addressed.  She verbalizes understanding and agrees with the plan. Disposition Plan: Back to previous home environment Consults called: Gastroenterology Status: At the time of admission, it appears that the appropriate admission status for this patient is inpatient. This is judged to be reasonable and necessary in order to provide the required intensity of service to ensure the patient's safety given the presenting symptoms, physical exam findings, and initial radiographic and laboratory data in the context of their comorbid conditions. Patient requires inpatient status due to high intensity of service, high risk for further deterioration and high frequency of surveillance required.    Lucile Shutters MD [facility]     09/14/2020, 10:07 AM

## 2020-09-14 NOTE — ED Notes (Signed)
Pt placed on 6L via [place] due to O2 sats dropping to 88% on room air. MD Isaacs notified.

## 2020-09-14 NOTE — Transfer of Care (Signed)
Immediate Anesthesia Transfer of Care Note  Patient: Noah Marshall  Procedure(s) Performed: ESOPHAGOGASTRODUODENOSCOPY (EGD) WITH PROPOFOL  Patient Location: PACU  Anesthesia Type:General  Level of Consciousness: drowsy  Airway & Oxygen Therapy: Patient Spontanous Breathing and Patient connected to face mask oxygen  Post-op Assessment: Report given to RN and Post -op Vital signs reviewed and stable  Post vital signs: Reviewed and stable  Last Vitals:  Vitals Value Taken Time  BP 105/67 09/14/20 1220  Temp    Pulse 77 09/14/20 1223  Resp 18 09/14/20 1223  SpO2 98 % 09/14/20 1223  Vitals shown include unvalidated device data.  Last Pain:  Vitals:   09/14/20 1126  TempSrc: Temporal  PainSc: 0-No pain         Complications: No notable events documented.

## 2020-09-14 NOTE — H&P (Signed)
Wyline Mood, MD [address], [address], [place], [place], [address] [address], [place], [place], [address] Phone: [phone]  Fax: [phone]  Primary Care Physician:  Malva Limes, MD   Pre-Procedure History & Physical: HPI:  Noah Marshall is Noah 25 y.o. male is here for an endoscopy    Past Medical History:  Diagnosis Date   Aspiration pneumonia (HCC)    Autism    Epilepsy (HCC)    Epilepsy with status epilepticus (HCC)    Intracranial arachnoid cysts    Pes planovalgus    Pneumonia 09/26/2015   Premature ventricular contractions    Respiratory failure (HCC)     Past Surgical History:  Procedure Laterality Date   FOREIGN BODY REMOVAL N/Noah 03/08/2015   Procedure: FOREIGN BODY REMOVAL;  Surgeon: Elnita Maxwell, MD;  Location: [facility] ENDOSCOPY;  Service: Endoscopy;  Laterality: N/Noah;   IMPLANTATION VAGAL NERVE STIMULATOR  2011   REPLACE / REVISE VAGAL NERVE STIMULATOR      Prior to Admission medications   Medication Sig Start Date End Date Taking? Authorizing Provider  cetirizine HCl (ZYRTEC) 1 MG/ML solution Take 15 mg by mouth daily.    [provider]  cloBAZam (ONFI) 2.5 MG/ML solution SHAKE WELL AND GIVE 6 ML BY MOUTH TWICE DAILY 07/19/20     cloBAZam (ONFI) 2.5 MG/ML solution SHAKE WELL AND GIVE 6 ML BY MOUTH TWICE DAILY 08/29/20     clonazePAM (KLONOPIN) 0.5 MG tablet TAKE 1 TABLET BY MOUTH 3 TIMES DAILY. 02/15/20 08/13/20    clonazePAM (KLONOPIN) 0.5 MG tablet TAKE 1 TABLET BY MOUTH 3 TIMES DAILY. 08/17/20     fluticasone (FLONASE) 50 MCG/ACT nasal spray Place 2 sprays into both nostrils daily.    [provider]  guanFACINE (TENEX) 1 MG tablet TAKE 1 TABLET BY G-TUBE 2 TIMES DAILY 07/13/19 07/12/20  Phylis Bougie, MD  guanFACINE (TENEX) 1 MG tablet TAKE 1 TABLET BY G-TUBE 2 TIMES DAILY 08/19/20     levOCARNitine (CARNITOR) 1 GM/10ML solution TAKE 5 ML BY MOUTH TWO TIMES DAILY 12/06/19 12/05/20  Phylis Bougie, MD  levOCARNitine  (CARNITOR) 1 GM/10ML solution Take 5 mLs (500 mg total) by mouth 2 (two) times daily 08/29/20     montelukast (SINGULAIR) 10 MG tablet Take 10 mg by mouth at bedtime.    [provider]  OXcarbazepine (TRILEPTAL) 300 MG/5ML suspension Take 600 mg by mouth 2 (two) times daily.     [provider]  topiramate (TOPAMAX) 200 MG tablet Place 200 mg into feeding tube 2 (two) times daily.     [provider]  topiramate (TOPAMAX) 200 MG tablet TAKE 1 TABLET BY MOUTH TWICE DAILY, CRUSHED. NO MORE G-TUBE (3 YEARS). 01/12/20 01/11/21  Phylis Bougie, MD  topiramate (TOPAMAX) 200 MG tablet Take 1 tablet (200 mg total) by mouth 2 (two) times daily Oral, crushed. No more g-tube (3 years). 08/29/20     TRILEPTAL 300 MG/5ML suspension TAKE 10 MLS BY MOUTH 2 TIMES DAILY 01/12/20 01/11/21  Phylis Bougie, MD  TRILEPTAL 300 MG/5ML suspension Take 10 mLs (600 mg total) by mouth 2 (two) times daily 08/29/20     valproic acid (DEPAKENE) 250 MG/5ML solution TAKE 10 MLS BY MOUTH 2 TIMES DAILY 12/29/19 12/28/20  White, Susannah, PA-C  valproic acid (DEPAKENE) 250 MG/5ML solution Take 10 mLs (500 mg total) by mouth 2 (two) times daily 08/29/20     Vitamin D, Ergocalciferol, (DRISDOL) 50000  units CAPS capsule Take 1 capsule (50,000 Units total) by mouth every 7 (seven) days. 02/19/17   Shaune Pollack, MD    Allergies as of 09/14/2020 - Review Complete 09/14/2020  Allergen Reaction Noted   Lactose intolerance (gi) Diarrhea 03/08/2015   Peanut-containing drug products Hives 03/08/2015    Family History  Problem Relation Age of Onset   Diabetes Mellitus II Father    Hypertension Father     Social History   Socioeconomic History   Marital status: Single    Spouse name: Not on file   Number of children: Not on file   Years of education: Not on file   Highest education level: Not on file  Occupational History   Not on file  Tobacco Use   Smoking status: Never   Smokeless tobacco: Never   Substance and Sexual Activity   Alcohol use: No   Drug use: No   Sexual activity: Never  Other Topics Concern   Not on file  Social History Narrative   Not on file   Social Determinants of Health   Financial Resource Strain: Not on file  Food Insecurity: Not on file  Transportation Needs: Not on file  Physical Activity: Not on file  Stress: Not on file  Social Connections: Not on file  Intimate Partner Violence: Not on file    Review of Systems: See HPI, otherwise negative ROS  Physical Exam: BP 115/81   Pulse (!) 106   Temp (!) 97 F (36.1 C) (Temporal)   Resp (!) 32   Ht 5\' 6"  (1.676 m)   Wt 62 kg   SpO2 95%   BMI 22.06 kg/m  General:   Awake and alert Head:  Normocephalic and atraumatic. Neck:  Supple; no masses or thyromegaly. Lungs: Decreased air entry right base Heart:  +S1, +S2, Regular rate and rhythm, No edema. Abdomen:  Soft, nontender and nondistended. Normal bowel sounds, without guarding, and without rebound.   Neurologic:  Alert and  oriented xo he is at his baseline  Impression/Plan: Noah Marshall is here for an endoscopy  to be performed for  evaluation of food impaction    Risks, benefits, limitations, and alternatives regarding endoscopy have been reviewed with the patient.  Questions have been answered.  All parties agreeable.   Micah Noel, MD  09/14/2020, 11:32 AM

## 2020-09-14 NOTE — Anesthesia Procedure Notes (Signed)
Procedure Name: Intubation Date/Time: 09/14/2020 11:46 AM Performed by: Doreen Salvage, CRNA Pre-anesthesia Checklist: Patient identified, Emergency Drugs available, Suction available and Patient being monitored Patient Re-evaluated:Patient Re-evaluated prior to induction Oxygen Delivery Method: Circle system utilized Preoxygenation: Pre-oxygenation with 100% oxygen Induction Type: IV induction, Cricoid Pressure applied and Rapid sequence Ventilation: Mask ventilation without difficulty Laryngoscope Size: Mac and 3 Grade View: Grade II Tube type: Oral Tube size: 7.0 mm Number of attempts: 1 Airway Equipment and Method: Stylet Placement Confirmation: ETT inserted through vocal cords under direct vision, positive ETCO2 and breath sounds checked- equal and bilateral Secured at: 22 cm Tube secured with: Tape Dental Injury: Teeth and Oropharynx as per pre-operative assessment

## 2020-09-14 NOTE — Consult Note (Signed)
Wyline Mood , MD [address], [place], [place], [address] [address], [place], [place], [address] Phone: [phone]  Fax: [phone]  Consultation  Referring Provider:     Dr Erma Heritage Primary Care Physician:  Malva Limes, MD Primary Gastroenterologist: None         Reason for Consultation:     Food impaction  Date of Admission:  09/14/2020 Date of Consultation:  09/14/2020         HPI:   Noah Marshall is a 25 y.o. male with a history of cerebral palsy, autism and epilepsy came into the ER with concerns for aspiration with cough.  Patient is nonverbal.  I discussed with Dr. Ileana Roup in the ER he states that there was concern that he may have swallowed a teabag and had been coughing and drooling since then and has now developed an aspiration pneumonitis/pneumonia.  This is around 10 PM last night.  He underwent a CT scan of the chest: In the upper cervical esophagus appears to be a foreign body appearing material.  Hemoglobin 13.9 g.  Platelet count 128 BMP shows creatinine of 0.51 chest x-ray shows right middle and lower lobe pneumonia.  He has been commenced on antibiotics.  The patient is unable to give any history.  Mother states that she gave him a cup of tea with CT back yesterday and later noticed to be missing.  He is unable to maintain his secretions.  Does not appear to be in significant distress at this point of time.  Past Medical History:  Diagnosis Date   Aspiration pneumonia (HCC)    Autism    Epilepsy (HCC)    Epilepsy with status epilepticus (HCC)    Intracranial arachnoid cysts    Pes planovalgus    Pneumonia 09/26/2015   Premature ventricular contractions    Respiratory failure (HCC)     Past Surgical History:  Procedure Laterality Date   FOREIGN BODY REMOVAL N/A 03/08/2015   Procedure: FOREIGN BODY REMOVAL;  Surgeon: Elnita Maxwell, MD;  Location[facility] END[facility];  Service: Endoscopy;  Laterality: N/A;   IMPLANTATION VAGAL NERVE  STIMULATOR  2011   REPLACE / REVISE VAGAL NERVE STIMULATOR      Prior to Admission medications   Medication Sig Start Date End Date Taking? Authorizing Provider  cetirizine HCl (ZYRTEC) 1 MG/ML solution Take 15 mg by mouth daily.    [provider]  cloBAZam (ONFI) 2.5 MG/ML solution SHAKE WELL AND GIVE 6 ML BY MOUTH TWICE DAILY 07/19/20     cloBAZam (ONFI) 2.5 MG/ML solution SHAKE WELL AND GIVE 6 ML BY MOUTH TWICE DAILY 08/29/20     clonazePAM (KLONOPIN) 0.5 MG tablet TAKE 1 TABLET BY MOUTH 3 TIMES DAILY. 02/15/20 08/13/20    clonazePAM (KLONOPIN) 0.5 MG tablet TAKE 1 TABLET BY MOUTH 3 TIMES DAILY. 08/17/20     fluticasone (FLONASE) 50 MCG/ACT nasal spray Place 2 sprays into both nostrils daily.    [provider]  guanFACINE (TENEX) 1 MG tablet TAKE 1 TABLET BY G-TUBE 2 TIMES DAILY 07/13/19 07/12/20  Phylis Bougie, MD  guanFACINE (TENEX) 1 MG tablet TAKE 1 TABLET BY G-TUBE 2 TIMES DAILY 08/19/20     levOCARNitine (CARNITOR) 1 GM/10ML solution TAKE 5 ML BY MOUTH TWO TIMES DAILY 12/06/19 12/05/20  Phylis Bougie, MD  levOCARNitine (CARNITOR) 1 GM/10ML solution Take 5 mLs (500 mg total) by mouth 2 (two) times daily 08/29/20     montelukast (  SINGULAIR) 10 MG tablet Take 10 mg by mouth at bedtime.    [provider]  OXcarbazepine (TRILEPTAL) 300 MG/5ML suspension Take 600 mg by mouth 2 (two) times daily.     [provider]  topiramate (TOPAMAX) 200 MG tablet Place 200 mg into feeding tube 2 (two) times daily.     [provider]  topiramate (TOPAMAX) 200 MG tablet TAKE 1 TABLET BY MOUTH TWICE DAILY, CRUSHED. NO MORE G-TUBE (3 YEARS). 01/12/20 01/11/21  Phylis Bougie, MD  topiramate (TOPAMAX) 200 MG tablet Take 1 tablet (200 mg total) by mouth 2 (two) times daily Oral, crushed. No more g-tube (3 years). 08/29/20     TRILEPTAL 300 MG/5ML suspension TAKE 10 MLS BY MOUTH 2 TIMES DAILY 01/12/20 01/11/21  Phylis Bougie, MD  TRILEPTAL 300 MG/5ML suspension Take  10 mLs (600 mg total) by mouth 2 (two) times daily 08/29/20     valproic acid (DEPAKENE) 250 MG/5ML solution TAKE 10 MLS BY MOUTH 2 TIMES DAILY 12/29/19 12/28/20  White, Susannah, PA-C  valproic acid (DEPAKENE) 250 MG/5ML solution Take 10 mLs (500 mg total) by mouth 2 (two) times daily 08/29/20     Vitamin D, Ergocalciferol, (DRISDOL) 50000 units CAPS capsule Take 1 capsule (50,000 Units total) by mouth every 7 (seven) days. 02/19/17   Shaune Pollack, MD    Family History  Problem Relation Age of Onset   Diabetes Mellitus II Father    Hypertension Father      Social History   Tobacco Use   Smoking status: Never   Smokeless tobacco: Never  Substance Use Topics   Alcohol use: No   Drug use: No    Allergies as of 09/14/2020 - Review Complete 09/14/2020  Allergen Reaction Noted   Lactose intolerance (gi) Diarrhea 03/08/2015   Peanut-containing drug products Hives 03/08/2015    Review of Systems:    Patient unable to provide review of systems   Physical Exam:  Vital signs in last 24 hours: Temp:  [99.4 F (37.4 C)] 99.4 F (37.4 C) (08/18 0605) Pulse Rate:  [112-114] 114 (08/18 0613) Resp:  [20-23] 20 (08/18 0630) BP: (106-109)/(62-63) 109/62 (08/18 0630) SpO2:  [91 %-97 %] 97 % (08/18 0630) Weight:  [62 kg] 62 kg (08/18 0620)   General:   Appears comfortable Head:  Normocephalic and atraumatic. Eyes:   No icterus.   Conjunctiva pink. PERRLA. Ears: Unable to clearly assess Neck:  Supple; no masses or thyroidomegaly Lungs: Decreased air entry right base Heart:  Regular rate and rhythm;  Without murmur, clicks, rubs or gallops Abdomen:  Soft, nondistended, nontender. Normal bowel sounds. No appreciable masses or hepatomegaly.  No rebound or guarding.  Neurologic:  Alert and oriented x0;Baseline Skin:  Intact without significant lesions or rashes. Cervical Nodes:  No significant cervical adenopathy. Psych: Awake  LAB RESULTS: Recent Labs    09/14/20 0629  WBC 6.1  HGB 13.9   HCT 37.4*  PLT 128*   BMET Recent Labs    09/14/20 0629  NA 128*  K 3.8  CL 92*  CO2 26  GLUCOSE 75  BUN 7  CREATININE 0.51*  CALCIUM 9.5   LFT No results for input(s): PROT, ALBUMIN, AST, ALT, ALKPHOS, BILITOT, BILIDIR, IBILI in the last 72 hours. PT/INR No results for input(s): LABPROT, INR in the last 72 hours.  STUDIES: DG Chest 2 View  Result Date: 09/14/2020 CLINICAL DATA:  Aspiration. EXAM: CHEST - 2 VIEW COMPARISON:  Chest x-ray dated April 22, 2020. FINDINGS: Vagal nerve stimulator again noted. The heart size and mediastinal contours are within normal limits. Patchy consolidation in the right middle and lower lobes. No pleural effusion or pneumothorax. No acute osseous abnormality. IMPRESSION: 1. Right middle and lower lobe pneumonia. Electronically Signed   By: Obie Dredge M.D.   On: 09/14/2020 07:17   CT Soft Tissue Neck W Contrast  Result Date: 09/14/2020 CLINICAL DATA:  Drooling and coughing after ingesting AT back. EXAM: CT NECK WITH CONTRAST TECHNIQUE: Multidetector CT imaging of the neck was performed using the standard protocol following the bolus administration of intravenous contrast. CONTRAST:  63mL OMNIPAQUE IOHEXOL 350 MG/ML SOLN COMPARISON:  03/08/2015 neck radiograph FINDINGS: Pharynx and larynx: Pharynx and larynx are negative for swelling, visible injury, or visible inflammation. In the upper cervical esophagus is a elongated area of solid appearing material that contains internal gas. This structure measures 5 cm in length by 1.2 Cm in thickness. Superiorly there is an associated tiny high-density area that is not typical of a staple. No underlying esophageal wall thickening. Salivary glands: No inflammation, mass, or stone. Thyroid: Normal. Lymph nodes: None enlarged or abnormal density. Vascular: Vagal nerve stimulator on the left. Limited intracranial: Negative Visualized orbits: Negative Mastoids and visualized paranasal sinuses: Clear Skeleton:  Negative Upper chest: Negative IMPRESSION: Probable food impaction in the cervical esophagus. Electronically Signed   By: Marnee Spring M.D.   On: 09/14/2020 08:50      Impression / Plan:   Noah Marshall is a 25 y.o. y/o male with history of autism providing limited history.  I was called by Dr. As in the ER and they are concerned that the patient might have swallowed a teabag after having his dinner last night.  CT scan of the neck shows possible foreign body in the upper cervical esophagus.  I discussed with his mother and the plan will be to perform an EGD  I have discussed alternative options, risks & benefits,  which include, but are not limited to, bleeding, infection, perforation,respiratory complication & drug reaction.  The patient agrees with this plan & written consent will be obtained.       Thank you for involving me in the care of this patient.      LOS: 0 days   Wyline Mood, MD  09/14/2020, 9:17 AM

## 2020-09-15 ENCOUNTER — Encounter: Payer: Self-pay | Admitting: Gastroenterology

## 2020-09-15 ENCOUNTER — Other Ambulatory Visit: Payer: Self-pay

## 2020-09-15 DIAGNOSIS — J690 Pneumonitis due to inhalation of food and vomit: Secondary | ICD-10-CM | POA: Diagnosis not present

## 2020-09-15 DIAGNOSIS — J45909 Unspecified asthma, uncomplicated: Secondary | ICD-10-CM | POA: Insufficient documentation

## 2020-09-15 LAB — BASIC METABOLIC PANEL
Anion gap: 11 (ref 5–15)
BUN: 14 mg/dL (ref 6–20)
CO2: 21 mmol/L — ABNORMAL LOW (ref 22–32)
Calcium: 8.9 mg/dL (ref 8.9–10.3)
Chloride: 102 mmol/L (ref 98–111)
Creatinine, Ser: 0.54 mg/dL — ABNORMAL LOW (ref 0.61–1.24)
GFR, Estimated: >60 mL/min (ref >60)
Glucose, Bld: 96 mg/dL (ref 70–99)
Potassium: 3.3 mmol/L — ABNORMAL LOW (ref 3.5–5.1)
Sodium: 134 mmol/L — ABNORMAL LOW (ref 135–145)

## 2020-09-15 LAB — HIV ANTIBODY (ROUTINE TESTING W REFLEX): HIV Screen 4th Generation wRfx: NONREACTIVE

## 2020-09-15 LAB — HEPATITIS C ANTIBODY: HCV Ab: NONREACTIVE

## 2020-09-15 LAB — OSMOLALITY: Osmolality: 283 mOsm/kg (ref 275–295)

## 2020-09-15 LAB — PATHOLOGIST SMEAR REVIEW

## 2020-09-15 LAB — SAVE SMEAR(SSMR), FOR PROVIDER SLIDE REVIEW

## 2020-09-15 LAB — PROCALCITONIN: Procalcitonin: 0.41 ng/mL

## 2020-09-15 MED ORDER — GUANFACINE HCL 1 MG PO TABS
ORAL_TABLET | ORAL | 11 refills | Status: DC
  Filled 2020-09-15: qty 60, 30d supply, fill #0
  Filled 2020-10-24: qty 60, 30d supply, fill #1
  Filled 2020-11-22: qty 60, 30d supply, fill #2
  Filled 2020-12-19: qty 60, 30d supply, fill #3
  Filled 2021-01-14: qty 60, 30d supply, fill #4
  Filled 2021-02-26: qty 60, 30d supply, fill #5
  Filled 2021-03-28 – 2021-05-10 (×2): qty 60, 30d supply, fill #6
  Filled 2021-06-12: qty 60, 30d supply, fill #7
  Filled 2021-07-10: qty 60, 30d supply, fill #8

## 2020-09-15 NOTE — Discharge Summary (Signed)
Noah Marshall GNF:[id number] DOB: Sep 16, 1995 DOA: 09/14/2020  PCP: Malva Limes, MD  Admit date: 09/14/2020 Discharge date: 09/15/2020  Time spent: 40 minutes  Recommendations for Outpatient Follow-up:  Close GI f/u for further mgmt of esophageal stricture Neurology f/u, close monitoring of sodium     Discharge Diagnoses:  Principal Problem:   Aspiration pneumonia (HCC) Active Problems:   Epilepsy (HCC)   Hyponatremia   Dysphagia   Autistic disorder   Discharge Condition: stable  Diet recommendation: regular, small bites  Filed Weights   09/14/20 0620 09/14/20 1126 09/14/20 2240  Weight: 62 kg 62 kg 58.1 kg    History of present illness:  Noah Marshall is a 25 y.o. male with medical history significant for autism, epilepsy status post vagal nerve stimulator who presents to the emergency room for evaluation with concerns for possible aspiration. Patient's mother states that at around 10:30 PM last night patient ate a tea bag.  She monitored him overnight and noticed that he had been drooling and also had a cough.  She checked his pulse oximetry through the night and it stayed within normal limits greater than 92%.  She states that she had attempted to administer his antiepileptic medications but he spit it out. Due to the persistent drooling, difficulty swallowing and spitting up she was concerned he may have aspirated and so he was brought to the ER for further evaluation.  Hospital Course:  Patient admitted w/ dysphagia. Concern for retained tea bag. Endoscopy did not show a tea bag but did show esophageal stricture with food impaction, which was disimpacted. Patient tolerated the procedure well. CXR with signs possible aspiration pneumonia but clinically no signs/symptoms pneumonia so antibiotics were deferred. Initial sodium 128, repeat was 134, likely 2/2 antiepileptics. Discussed w/ neurology, advised no med changes, but instead outpatient monitoring of sodium levels.  Will f/u with GI to consider repeat endoscopy with dilation or other treatment of esophageal stricture. This problem may contribute to his history of aspiration events.   Procedures: Upper endoscopy   Consultations: GI  Discharge Exam: Vitals:   09/15/20 1002 09/15/20 1111  BP: (!) 104/50 (!) 100/47  Pulse: 99 (!) 110  Resp: 18 18  Temp:  98 F (36.7 C)  SpO2: 97% 97%    General: NAD, chronically ill appearing Cardiovascular: RRR Respiratory: Rales at bases, otherwise clear  Discharge Instructions   Discharge Instructions     Diet general   Complete by: As directed    Increase activity slowly   Complete by: As directed       Allergies as of 09/15/2020       Reactions   Lactose Intolerance (gi) Diarrhea   Peanut-containing Drug Products Hives        Medication List     TAKE these medications    cetirizine HCl 1 MG/ML solution Commonly known as: ZYRTEC Take 15 mg by mouth daily.   cloBAZam 2.5 MG/ML solution Commonly known as: ONFI SHAKE WELL AND GIVE 6 ML BY MOUTH TWICE DAILY What changed: Another medication with the same name was removed. Continue taking this medication, and follow the directions you see here.   clonazePAM 0.5 MG tablet Commonly known as: KLONOPIN TAKE 1 TABLET BY MOUTH 3 TIMES DAILY.   fluticasone 50 MCG/ACT nasal spray Commonly known as: FLONASE Place 2 sprays into both nostrils daily.   guanFACINE 1 MG tablet Commonly known as: TENEX TAKE 1 TABLET BY G-TUBE 2 TIMES DAILY What changed: Another medication with the  same name was added. Make sure you understand how and when to take each.   guanFACINE 1 MG tablet Commonly known as: TENEX TAKE 1 TABLET BY G-TUBE 2 TIMES DAILY What changed: You were already taking a medication with the same name, and this prescription was added. Make sure you understand how and when to take each.   levOCARNitine 1 GM/10ML solution Commonly known as: CARNITOR Take 5 mLs (500 mg total) by mouth 2  (two) times daily What changed: Another medication with the same name was removed. Continue taking this medication, and follow the directions you see here.   montelukast 10 MG tablet Commonly known as: SINGULAIR Take 10 mg by mouth at bedtime.   OXcarbazepine 300 MG/5ML suspension Commonly known as: TRILEPTAL Take 600 mg by mouth 2 (two) times daily. What changed: Another medication with the same name was removed. Continue taking this medication, and follow the directions you see here.   Trileptal 300 MG/5ML suspension Generic drug: OXcarbazepine TAKE 10 MLS BY MOUTH 2 TIMES DAILY What changed: Another medication with the same name was removed. Continue taking this medication, and follow the directions you see here.   topiramate 200 MG tablet Commonly known as: TOPAMAX Take 1 tablet (200 mg total) by mouth 2 (two) times daily Oral, crushed. No more g-tube (3 years). What changed: Another medication with the same name was removed. Continue taking this medication, and follow the directions you see here.   valproic acid 250 MG/5ML solution Commonly known as: DEPAKENE Take 10 mLs (500 mg total) by mouth 2 (two) times daily What changed: Another medication with the same name was removed. Continue taking this medication, and follow the directions you see here.   Vitamin D (Ergocalciferol) 1.25 MG (50000 UNIT) Caps capsule Commonly known as: DRISDOL Take 1 capsule (50,000 Units total) by mouth every 7 (seven) days.       Allergies  Allergen Reactions   Lactose Intolerance (Gi) Diarrhea   Peanut-Containing Drug Products Hives    Follow-up Information     Wyline Mood, MD. Schedule an appointment as soon as possible for a visit.   Specialty: Gastroenterology Contact information: [address] [place] [address] [phone]                  The results of significant diagnostics from this hospitalization (including imaging, microbiology, ancillary and  laboratory) are listed below for reference.    Significant Diagnostic Studies: DG Chest 2 View  Result Date: 09/14/2020 CLINICAL DATA:  Aspiration. EXAM: CHEST - 2 VIEW COMPARISON:  Chest x-ray dated April 22, 2020. FINDINGS: Vagal nerve stimulator again noted. The heart size and mediastinal contours are within normal limits. Patchy consolidation in the right middle and lower lobes. No pleural effusion or pneumothorax. No acute osseous abnormality. IMPRESSION: 1. Right middle and lower lobe pneumonia. Electronically Signed   By: Obie Dredge M.D.   On: 09/14/2020 07:17   CT Soft Tissue Neck W Contrast  Result Date: 09/14/2020 CLINICAL DATA:  Drooling and coughing after ingesting AT back. EXAM: CT NECK WITH CONTRAST TECHNIQUE: Multidetector CT imaging of the neck was performed using the standard protocol following the bolus administration of intravenous contrast. CONTRAST:  63mL OMNIPAQUE IOHEXOL 350 MG/ML SOLN COMPARISON:  03/08/2015 neck radiograph FINDINGS: Pharynx and larynx: Pharynx and larynx are negative for swelling, visible injury, or visible inflammation. In the upper cervical esophagus is a elongated area of solid appearing material that contains internal gas. This structure measures 5  cm in length by 1.2 Cm in thickness. Superiorly there is an associated tiny high-density area that is not typical of a staple. No underlying esophageal wall thickening. Salivary glands: No inflammation, mass, or stone. Thyroid: Normal. Lymph nodes: None enlarged or abnormal density. Vascular: Vagal nerve stimulator on the left. Limited intracranial: Negative Visualized orbits: Negative Mastoids and visualized paranasal sinuses: Clear Skeleton: Negative Upper chest: Negative IMPRESSION: Probable food impaction in the cervical esophagus. Electronically Signed   By: Marnee Spring M.D.   On: 09/14/2020 08:50    Microbiology: Recent Results (from the past 240 hour(s))  Resp Panel by RT-PCR (Flu A&B, Covid)  Nasopharyngeal Swab     Status: None   Collection Time: 09/14/20  6:29 AM   Specimen: Nasopharyngeal Swab; Nasopharyngeal(NP) swabs in vial transport medium  Result Value Ref Range Status   SARS Coronavirus 2 by RT PCR NEGATIVE NEGATIVE Final    Comment: (NOTE) SARS-CoV-2 target nucleic acids are NOT DETECTED.  The SARS-CoV-2 RNA is generally detectable in upper respiratory specimens during the acute phase of infection. The lowest concentration of SARS-CoV-2 viral copies this assay can detect is 138 copies/mL. A negative result does not preclude SARS-Cov-2 infection and should not be used as the sole basis for treatment or other patient management decisions. A negative result may occur with  improper specimen collection/handling, submission of specimen other than nasopharyngeal swab, presence of viral mutation(s) within the areas targeted by this assay, and inadequate number of viral copies(<138 copies/mL). A negative result must be combined with clinical observations, patient history, and epidemiological information. The expected result is Negative.  Fact Sheet for Patients:  BloggerCourse.com  Fact Sheet for Healthcare Providers:  SeriousBroker.it  This test is no t yet approved or cleared by the [place] FDA and  has been authorized for detection and/or diagnosis of SARS-CoV-2 by FDA under an Emergency Use Authorization (EUA). This EUA will remain  in effect (meaning this test can be used) for the duration of the COVID-19 declaration under Section 564(b)(1) of the Act, 21 U.S.C.section 360bbb-3(b)(1), unless the authorization is terminated  or revoked sooner.       Influenza A by PCR NEGATIVE NEGATIVE Final   Influenza B by PCR NEGATIVE NEGATIVE Final    Comment: (NOTE) The Xpert Xpress SARS-CoV-2/FLU/RSV plus assay is intended as an aid in the diagnosis of influenza from Nasopharyngeal swab specimens and should not be  used as a sole basis for treatment. Nasal washings and aspirates are unacceptable for Xpert Xpress SARS-CoV-2/FLU/RSV testing.  Fact Sheet for Patients: BloggerCourse.com  Fact Sheet for Healthcare Providers: SeriousBroker.it  This test is not yet approved or cleared by the [place] FDA and has been authorized for detection and/or diagnosis of SARS-CoV-2 by FDA under an Emergency Use Authorization (EUA). This EUA will remain in effect (meaning this test can be used) for the duration of the COVID-19 declaration under Section 564(b)(1) of the Act, 21 U.S.C. section 360bbb-3(b)(1), unless the authorization is terminated or revoked.  Performed at [facility], [address] [place], [place] [address]   Blood culture (routine x 2)     Status: None (Preliminary result)   Collection Time: 09/14/20  7:50 AM   Specimen: BLOOD  Result Value Ref Range Status   Specimen Description BLOOD LEFT FA  Final   Special Requests   Final    BOTTLES DRAWN AEROBIC AND ANAEROBIC Blood Culture results may not be optimal due to an inadequate volume of blood received in culture  bottles   Culture   Final    NO GROWTH < 24 HOURS Performed at [facility], [address] [place], [place] [address]    Report Status PENDING  Incomplete  Blood culture (routine x 2)     Status: None (Preliminary result)   Collection Time: 09/14/20  3:02 PM   Specimen: BLOOD  Result Value Ref Range Status   Specimen Description BLOOD BLOOD RIGHT HAND  Final   Special Requests   Final    BOTTLES DRAWN AEROBIC ONLY Blood Culture results may not be optimal due to an inadequate volume of blood received in culture bottles   Culture   Final    NO GROWTH < 24 HOURS Performed at [facility], [address] [place], [place] [address]    Report Status PENDING  Incomplete     Labs: Basic Metabolic Panel: Recent Labs  Lab 09/14/20 0629  09/15/20 1100  NA 128* 134*  K 3.8 3.3*  CL 92* 102  CO2 26 21*  GLUCOSE 75 96  BUN 7 14  CREATININE 0.51* 0.54*  CALCIUM 9.5 8.9   Liver Function Tests: No results for input(s): AST, ALT, ALKPHOS, BILITOT, PROT, ALBUMIN in the last 168 hours. No results for input(s): LIPASE, AMYLASE in the last 168 hours. No results for input(s): AMMONIA in the last 168 hours. CBC: Recent Labs  Lab 09/14/20 0629  WBC 6.1  NEUTROABS 4.7  HGB 13.9  HCT 37.4*  MCV 86.6  PLT 128*   Cardiac Enzymes: No results for input(s): CKTOTAL, CKMB, CKMBINDEX, TROPONINI in the last 168 hours. BNP: BNP (last 3 results) No results for input(s): BNP in the last 8760 hours.  ProBNP (last 3 results) No results for input(s): PROBNP in the last 8760 hours.  CBG: No results for input(s): GLUCAP in the last 168 hours.     Signed:  Silvano BilisNoah B Rosalie Buenaventura MD.  [facility] 09/15/2020, 12:16 PM

## 2020-09-15 NOTE — Plan of Care (Signed)
  Problem: Health Behavior/Discharge Planning: Goal: Ability to manage health-related needs will improve Outcome: Progressing   Problem: Clinical Measurements: Goal: Will remain free from infection Outcome: Progressing   Problem: Clinical Measurements: Goal: Respiratory complications will improve Outcome: Progressing   Problem: Clinical Measurements: Goal: Cardiovascular complication will be avoided Outcome: Progressing   Problem: Safety: Goal: Ability to remain free from injury will improve Outcome: Progressing   

## 2020-09-15 NOTE — Plan of Care (Signed)

## 2020-09-15 NOTE — Progress Notes (Signed)
Patient discharged to home via private vehicle, accompanied by family. Discharge teaching completed and included: new medication regimen, follow-up appointment info - Mother wants to make appointment, S&S tp report/return to hospital with immediately, and diet restrictions. Mother verbalizes understanding of all discharge instructions and asks appropriate questions.

## 2020-09-18 ENCOUNTER — Other Ambulatory Visit: Payer: Self-pay | Admitting: *Deleted

## 2020-09-18 NOTE — Patient Outreach (Signed)
[facility]) Care Management  09/18/2020  Tywon Niday Riga 1995/11/27 [id number]   Transition of care telephone call  Referral received:09/15/20 Initial outreach:09/18/20 Insurance: [facility]   Initial unsuccessful telephone call to patient's preferred number his Mother Krisitin Kittleson, legal guardian  in order to complete transition of care assessment; no answer, left HIPAA compliant voicemail message requesting return call.   Objective: Per the electronic medical record, Slyvester Latona was hospitalized at [facility] 8/18-8/19  Aspiration Pneumonia,endoscopy shows esophageal stricture with food impaction.  Marland KitchenPMHx include:  Autism, Epilepsy status post vagal nerve stimulator,  He was discharged to home on 09/15/20 without the need for home health services or durable medical equipment per the discharge summary.   Plan: This RNCM will route unsuccessful outreach letter with [facility] Care Management pamphlet and 24 hour Nurse Advice Line Magnet to [employer] Care Management clinical pool to be mailed to patient's home address. This RNCM will attempt another outreach within 4 business days.   Egbert Garibaldi, RN, BSN  [facility] Care Management,Care Management Coordinator  [phone]- Mobile [phone]- Toll Free Main Office

## 2020-09-19 ENCOUNTER — Other Ambulatory Visit: Payer: Self-pay

## 2020-09-19 LAB — CULTURE, BLOOD (ROUTINE X 2)
Culture: NO GROWTH
Culture: NO GROWTH

## 2020-09-20 ENCOUNTER — Other Ambulatory Visit: Payer: Self-pay

## 2020-09-21 ENCOUNTER — Telehealth: Payer: Self-pay | Admitting: Gastroenterology

## 2020-09-21 ENCOUNTER — Other Ambulatory Visit: Payer: Self-pay | Admitting: *Deleted

## 2020-09-21 DIAGNOSIS — R1310 Dysphagia, unspecified: Secondary | ICD-10-CM

## 2020-09-21 NOTE — Patient Outreach (Signed)
[facility]) Care Management  09/21/2020  Noah Marshall 1995/02/09 [id number]   Transition of care call Referral received: 09/15/20 Initial outreach attempt: 09/18/20 Insurance: [employer]   2nd unsuccessful telephone call to patient's preferred contact number in order to complete post hospital discharge transition of care assessment , no answer left HIPAA compliant message requesting return call.    Objective: Per the electronic medical record, Noah Marshall was hospitalized at [facility] 8/18-8/19  Aspiration Pneumonia,endoscopy shows esophageal stricture with food impaction.  Marland KitchenPMHx include:  Autism, Epilepsy status post vagal nerve stimulator,  He was discharged to home on 09/15/20 without the need for home health services or durable medical equipment per the discharge summary.      Plan If no return call from patient will attempt 3rd outreach in the next 4 business days.   Egbert Garibaldi, RN, BSN  [facility] [place] [facility] Care Management,Care Management Coordinator  [phone]- Mobile [phone]- Toll Free Main Office

## 2020-09-21 NOTE — Telephone Encounter (Signed)
Patient's mother-Kristin (POA) wanted to know if Dr. Tobi Bastos was able to stretch patient's esophagus since he was having dysphagia and it was something that was recommended when he had an EGD on 09/14/2020. Can you please advise if he needs an office visit first or if I vcould go ahead and schedule an EGD with dilation.

## 2020-09-21 NOTE — Telephone Encounter (Signed)
Mother lvm to please call her to schedule another egd for esophagus to be stretched again.

## 2020-09-22 ENCOUNTER — Other Ambulatory Visit (HOSPITAL_COMMUNITY): Payer: Self-pay

## 2020-09-22 ENCOUNTER — Other Ambulatory Visit: Payer: Self-pay

## 2020-09-22 DIAGNOSIS — R1310 Dysphagia, unspecified: Secondary | ICD-10-CM

## 2020-09-22 MED ORDER — OMEPRAZOLE 40 MG PO CPDR
40.0000 mg | DELAYED_RELEASE_CAPSULE | Freq: Every day | ORAL | 0 refills | Status: DC
  Filled 2020-09-22: qty 30, 30d supply, fill #0

## 2020-09-22 NOTE — Telephone Encounter (Signed)
Called Mrs. Northrop to let her know what Dr. Tobi Bastos is recommending and she agreed. Therefore, I will send her son the prescription and schedule an EGD.

## 2020-09-25 ENCOUNTER — Other Ambulatory Visit: Payer: Self-pay | Admitting: *Deleted

## 2020-09-25 NOTE — Patient Outreach (Signed)
[facility]) Care Management  09/25/2020  Noah Marshall 11-24-1995 [id number]   Transition of care call Referral received: 09/15/20 Initial outreach attempt: 09/18/20 Insurance: [employer]  Third unsuccessful telephone call to patient's preferred contact number in order to complete post hospital discharge transition of care assessment; no answer, left HIPAA compliant message requesting return call.   Objective: Per the electronic medical record, Noah Marshall was hospitalized at [facility] 8/18-8/19  Aspiration Pneumonia,endoscopy shows esophageal stricture with food impaction.  Marland KitchenPMHx include:  Autism, Epilepsy status post vagal nerve stimulator,  He was discharged to home on 09/15/20 without the need for home health services or durable medical equipment per the discharge summary.     Plan: If no return call from patient, will plan return call in the next 3 weeks.    Egbert Garibaldi, RN, BSN  [facility] Care Management,Care Management Coordinator  [phone]- Mobile [phone]- Toll Free Main Office

## 2020-09-26 ENCOUNTER — Other Ambulatory Visit: Payer: Self-pay

## 2020-09-26 ENCOUNTER — Encounter: Payer: Self-pay | Admitting: *Deleted

## 2020-09-26 MED FILL — Oxcarbazepine Susp 300 MG/5ML (60 MG/ML): ORAL | 25 days supply | Qty: 500 | Fill #4 | Status: AC

## 2020-10-11 ENCOUNTER — Encounter
Admission: RE | Disposition: A | Discharge status: Home / Self Care | Payer: Self-pay | Source: Home / Self Care | Attending: Gastroenterology

## 2020-10-11 ENCOUNTER — Encounter: Payer: Self-pay | Admitting: Gastroenterology

## 2020-10-11 ENCOUNTER — Ambulatory Visit: Payer: 59 | Admitting: Anesthesiology

## 2020-10-11 ENCOUNTER — Ambulatory Visit
Admission: RE | Admit: 2020-10-11 | Discharge: 2020-10-11 | Disposition: A | Discharge status: Home / Self Care | Payer: 59 | Attending: Gastroenterology | Admitting: Gastroenterology

## 2020-10-11 ENCOUNTER — Other Ambulatory Visit: Payer: Self-pay

## 2020-10-11 DIAGNOSIS — K222 Esophageal obstruction: Secondary | ICD-10-CM | POA: Insufficient documentation

## 2020-10-11 DIAGNOSIS — K200 Eosinophilic esophagitis: Secondary | ICD-10-CM | POA: Insufficient documentation

## 2020-10-11 DIAGNOSIS — Z79899 Other long term (current) drug therapy: Secondary | ICD-10-CM | POA: Diagnosis not present

## 2020-10-11 DIAGNOSIS — R1310 Dysphagia, unspecified: Secondary | ICD-10-CM | POA: Diagnosis not present

## 2020-10-11 DIAGNOSIS — G40909 Epilepsy, unspecified, not intractable, without status epilepticus: Secondary | ICD-10-CM | POA: Diagnosis not present

## 2020-10-11 DIAGNOSIS — Z91010 Allergy to peanuts: Secondary | ICD-10-CM | POA: Insufficient documentation

## 2020-10-11 DIAGNOSIS — Z91011 Allergy to milk products: Secondary | ICD-10-CM | POA: Insufficient documentation

## 2020-10-11 HISTORY — DX: Esophageal obstruction: K22.2

## 2020-10-11 HISTORY — PX: ESOPHAGOGASTRODUODENOSCOPY (EGD) WITH PROPOFOL: SHX5813

## 2020-10-11 SURGERY — ESOPHAGOGASTRODUODENOSCOPY (EGD) WITH PROPOFOL
Anesthesia: General

## 2020-10-11 MED ORDER — PROPOFOL 10 MG/ML IV BOLUS
INTRAVENOUS | Status: DC | PRN
  Administered 2020-10-11: 50 mg via INTRAVENOUS

## 2020-10-11 MED ORDER — PROPOFOL 500 MG/50ML IV EMUL
INTRAVENOUS | Status: DC | PRN
  Administered 2020-10-11: 140 ug/kg/min via INTRAVENOUS

## 2020-10-11 MED ORDER — SODIUM CHLORIDE 0.9 % IV SOLN: INTRAVENOUS | Status: DC

## 2020-10-11 MED ORDER — LIDOCAINE HCL (CARDIAC) PF 100 MG/5ML IV SOSY
PREFILLED_SYRINGE | INTRAVENOUS | Status: DC | PRN
  Administered 2020-10-11: 80 mg via INTRAVENOUS

## 2020-10-11 NOTE — Anesthesia Preprocedure Evaluation (Signed)
Anesthesia Evaluation  Patient identified by MRN, date of birth, ID band Patient awake    Reviewed: Allergy & Precautions, H&P , NPO status , Patient's Chart, lab work & pertinent test results, reviewed documented beta blocker date and time   History of Anesthesia Complications Negative for: history of anesthetic complications  Airway Mallampati: IV  TM Distance: >3 FB Neck ROM: full  Mouth opening: Pediatric Airway  Dental  (+) Poor Dentition   Pulmonary neg shortness of breath, asthma , neg sleep apnea, pneumonia, resolved, neg recent URI,    Pulmonary exam normal        Cardiovascular Exercise Tolerance: Good Normal cardiovascular exam+ dysrhythmias (PVCs)      Neuro/Psych Seizures - (Vagus nerve stimulator), Poorly Controlled,  PSYCHIATRIC DISORDERS Autismnegative psych ROS   GI/Hepatic negative GI ROS, Neg liver ROS, Foreign body in esophagus   Endo/Other  negative endocrine ROS  Renal/GU negative Renal ROS  negative genitourinary   Musculoskeletal negative musculoskeletal ROS (+)   Abdominal   Peds negative pediatric ROS (+)  Hematology negative hematology ROS (+)   Anesthesia Other Findings . Aspiration pneumonia (HCC)  . Autism  . Epilepsy (HCC)  . Epilepsy with status epilepticus (HCC)  . Intracranial arachnoid cysts  . Pes planovalgus  . Pneumonia 09/26/2015 . Premature ventricular contractions  . Respiratory failure (HCC)     Reproductive/Obstetrics negative OB ROS                             Anesthesia Physical  Anesthesia Plan  ASA: 3  Anesthesia Plan: General   Post-op Pain Management:    Induction: Intravenous  PONV Risk Score and Plan: Propofol infusion and TIVA  Airway Management Planned: Natural Airway and Nasal Cannula  Additional Equipment:   Intra-op Plan:   Post-operative Plan:   Informed Consent: I have reviewed the patients History  and Physical, chart, labs and discussed the procedure including the risks, benefits and alternatives for the proposed anesthesia with the patient or authorized representative who has indicated his/her understanding and acceptance.     Dental Advisory Given  Plan Discussed with: Anesthesiologist, CRNA and Surgeon  Anesthesia Plan Comments: (Mother consented for risks of anesthesia including but not limited to:  - adverse reactions to medications - risk of airway placement if required - damage to eyes, teeth, lips or other oral mucosa - nerve damage due to positioning  - sore throat or hoarseness - Damage to heart, brain, nerves, lungs, other parts of body or loss of life  She voiced understanding.)        Anesthesia Quick Evaluation

## 2020-10-11 NOTE — Transfer of Care (Signed)
Immediate Anesthesia Transfer of Care Note  Patient: Noah Marshall  Procedure(s) Performed: ESOPHAGOGASTRODUODENOSCOPY (EGD) WITH PROPOFOL  Patient Location: PACU and Endoscopy Unit  Anesthesia Type:General  Level of Consciousness: drowsy  Airway & Oxygen Therapy: Patient Spontanous Breathing  Post-op Assessment: Report given to RN and Post -op Vital signs reviewed and stable  Post vital signs: Reviewed and stable  Last Vitals:  Vitals Value Taken Time  BP 108/47 10/11/20 1307  Temp 35.6 C 10/11/20 1307  Pulse 67 10/11/20 1308  Resp 13 10/11/20 1308  SpO2 99 % 10/11/20 1308  Vitals shown include unvalidated device data.  Last Pain:  Vitals:   10/11/20 1307  TempSrc: Temporal  PainSc:          Complications: No notable events documented.

## 2020-10-11 NOTE — Op Note (Signed)
[facility] Gastroenterology Patient Name: Noah Marshall Procedure Date: 10/11/2020 11:18 AM MRN: [id number] Account #: [id number] Date of Birth: Feb 01, 1995 Admit Type: Outpatient Age: 25 Room: [facility] ENDO ROOM 3 Gender: Male Note Status: Finalized Instrument Name: Upper Endoscope [id number] Procedure:             Upper GI endoscopy Indications:           Follow-up of esophageal stenosis Providers:             Wyline Mood MD, MD Referring MD:          Demetrios Isaacs. Sherrie Mustache, MD (Referring MD) Medicines:             Monitored Anesthesia Care Complications:         No immediate complications. Procedure:             Pre-Anesthesia Assessment:                        - Prior to the procedure, a History and Physical was                         performed, and patient medications, allergies and                         sensitivities were reviewed. The patient's tolerance                         of previous anesthesia was reviewed.                        - The risks and benefits of the procedure and the                         sedation options and risks were discussed with the                         patient. All questions were answered and informed                         consent was obtained.                        - ASA Grade Assessment: II - A patient with mild                         systemic disease.                        After obtaining informed consent, the endoscope was                         passed under direct vision. Throughout the procedure,                         the patient's blood pressure, pulse, and oxygen                         saturations were monitored continuously. The Endoscope  was introduced through the mouth, and advanced to the                         third part of duodenum. The upper GI endoscopy was                         accomplished with ease. The patient tolerated the                         procedure well. Findings:      One  benign-appearing, intrinsic severe (stenosis; an endoscope cannot       pass) stenosis was found in the upper third of the esophagus. This       stenosis measured 1 cm (inner diameter) x 1 cm (in length). The stenosis       was traversed after dilation. A TTS dilator was passed through the       scope. Dilation with an 09-05-08 mm balloon and a 11-08-10 mm balloon       dilator was performed to 12 mm. The dilation site was examined and       showed moderate mucosal disruption. Biopsies were taken with a cold       forceps for histology.      The stomach was normal.      The examined duodenum was normal.      The cardia and gastric fundus were normal on retroflexion. Impression:            - Benign-appearing esophageal stenosis. Dilated.                         Biopsied.                        - Normal stomach.                        - Normal examined duodenum. Recommendation:        - Discharge patient to home (with escort).                        - Resume previous diet.                        - Continue present medications.                        - Await pathology results.                        - Repeat upper endoscopy in 2 weeks for retreatment. Procedure Code(s):     --- Professional ---                        [phone], Esophagogastroduodenoscopy, flexible,                         transoral; with transendoscopic balloon dilation of                         esophagus (less than 30 mm diameter)  12751, 59, Esophagogastroduodenoscopy, flexible,                         transoral; with biopsy, single or multiple Diagnosis Code(s):     --- Professional ---                        K22.2, Esophageal obstruction CPT copyright 2019 American Medical Association. All rights reserved. The codes documented in this report are preliminary and upon coder review may  be revised to meet current compliance requirements. Wyline Mood, MD Wyline Mood MD, MD 10/11/2020 1:05:14 PM This  report has been signed electronically. Number of Addenda: 0 Note Initiated On: 10/11/2020 11:18 AM Estimated Blood Loss:  Estimated blood loss: none.      [facility]

## 2020-10-11 NOTE — H&P (Signed)
Wyline Mood, MD [address], [address], [place], [place], [address] [address], [address], [place], [place], [address] Phone: [phone]  Fax: [phone]  Primary Care Physician:  Malva Limes, MD   Pre-Procedure History & Physical: HPI:  Noah Marshall is a 25 y.o. male is here for an endoscopy    Past Medical History:  Diagnosis Date   Aspiration pneumonia (HCC)    Autism    Epilepsy (HCC)    Epilepsy with status epilepticus (HCC)    Esophageal stenosis    Intracranial arachnoid cysts    Pes planovalgus    Pneumonia 09/26/2015   Premature ventricular contractions    Respiratory failure (HCC)     Past Surgical History:  Procedure Laterality Date   ESOPHAGOGASTRODUODENOSCOPY (EGD) WITH PROPOFOL N/A 09/14/2020   Procedure: ESOPHAGOGASTRODUODENOSCOPY (EGD) WITH PROPOFOL;  Surgeon: Wyline Mood, MD;  Location: [facility] ENDOSCOPY;  Service: Gastroenterology;  Laterality: N/A;   FOREIGN BODY REMOVAL N/A 03/08/2015   Procedure: FOREIGN BODY REMOVAL;  Surgeon: Elnita Maxwell, MD;  Location: [facility] ENDOSCOPY;  Service: Endoscopy;  Laterality: N/A;   IMPLANTATION VAGAL NERVE STIMULATOR  2011   REPLACE / REVISE VAGAL NERVE STIMULATOR      Prior to Admission medications   Medication Sig Start Date End Date Taking? Authorizing Provider  cetirizine HCl (ZYRTEC) 1 MG/ML solution Take 15 mg by mouth daily.   Yes [provider]  cloBAZam (ONFI) 2.5 MG/ML solution SHAKE WELL AND GIVE 6 ML BY MOUTH TWICE DAILY 08/29/20  Yes   clonazePAM (KLONOPIN) 0.5 MG tablet TAKE 1 TABLET BY MOUTH 3 TIMES DAILY. 08/17/20  Yes   fluticasone (FLONASE) 50 MCG/ACT nasal spray Place 2 sprays into both nostrils daily.   Yes [provider]  guanFACINE (TENEX) 1 MG tablet TAKE 1 TABLET BY G-TUBE 2 TIMES DAILY 08/19/20  Yes   guanFACINE (TENEX) 1 MG tablet TAKE 1 TABLET BY G-TUBE 2 TIMES DAILY 09/15/20 09/15/21 Yes Wouk, Wilfred Curtis, MD  levOCARNitine (CARNITOR) 1 GM/10ML solution Take  5 mLs (500 mg total) by mouth 2 (two) times daily 08/29/20  Yes   montelukast (SINGULAIR) 10 MG tablet Take 10 mg by mouth at bedtime.   Yes [provider]  omeprazole (PRILOSEC) 40 MG capsule Take 1 capsule (40 mg total) by mouth daily. 09/22/20  Yes Wyline Mood, MD  OXcarbazepine (TRILEPTAL) 300 MG/5ML suspension Take 600 mg by mouth 2 (two) times daily.    Yes [provider]  topiramate (TOPAMAX) 200 MG tablet Take 1 tablet (200 mg total) by mouth 2 (two) times daily Oral, crushed. No more g-tube (3 years). 08/29/20  Yes   TRILEPTAL 300 MG/5ML suspension TAKE 10 MLS BY MOUTH 2 TIMES DAILY 01/12/20 01/11/21 Yes Phylis Bougie, MD  valproic acid (DEPAKENE) 250 MG/5ML solution Take 10 mLs (500 mg total) by mouth 2 (two) times daily 08/29/20  Yes   Vitamin D, Ergocalciferol, (DRISDOL) 50000 units CAPS capsule Take 1 capsule (50,000 Units total) by mouth every 7 (seven) days. 02/19/17  Yes Shaune Pollack, MD    Allergies as of 09/22/2020 - Review Complete 09/14/2020  Allergen Reaction Noted   Lactose intolerance (gi) Diarrhea 03/08/2015   Peanut-containing drug products Hives 03/08/2015    Family History  Problem Relation Age of Onset   Diabetes Mellitus II Father    Hypertension Father     Social History   Socioeconomic History   Marital status: Single    Spouse name: Not on  file   Number of children: Not on file   Years of education: Not on file   Highest education level: Not on file  Occupational History   Not on file  Tobacco Use   Smoking status: Never   Smokeless tobacco: Never  Substance and Sexual Activity   Alcohol use: No   Drug use: No   Sexual activity: Never  Other Topics Concern   Not on file  Social History Narrative   Not on file   Social Determinants of Health   Financial Resource Strain: Not on file  Food Insecurity: Not on file  Transportation Needs: Not on file  Physical Activity: Not on file  Stress: Not on file  Social Connections: Not  on file  Intimate Partner Violence: Not on file    Review of Systems: Cannot assess  Physical Exam: BP (!) 93/57   Pulse 74   Temp (!) 96.6 F (35.9 C) (Temporal)   Resp 16   Ht 5\' 6"  (1.676 m)   Wt 61.2 kg   BMI 21.79 kg/m  General:   Alert,  pleasant and cooperative in NAD Head:  Normocephalic and atraumatic. Neck:  Supple; no masses or thyromegaly. Lungs:  Clear throughout to auscultation, normal respiratory effort.    Heart:  +S1, +S2, Regular rate and rhythm, No edema. Abdomen:  Soft, nontender and nondistended. Normal bowel sounds, without guarding, and without rebound.   Neurologic:  Alert and  oriented x0;  gr  Impression/Plan: Noah Marshall is here for an endoscopy  to be performed for  evaluation of dysphagia    Risks, benefits, limitations, and alternatives regarding endoscopy have been reviewed with the patients mother .  Questions have been answered.  All parties agreeable.   , MD  10/11/2020, 12:45 PM

## 2020-10-11 NOTE — Anesthesia Postprocedure Evaluation (Signed)
Anesthesia Post Note  Patient: Noah Marshall  Procedure(s) Performed: ESOPHAGOGASTRODUODENOSCOPY (EGD) WITH PROPOFOL  Patient location during evaluation: PACU Anesthesia Type: General Level of consciousness: awake and alert Pain management: pain level controlled Vital Signs Assessment: post-procedure vital signs reviewed and stable Respiratory status: spontaneous breathing, nonlabored ventilation, respiratory function stable and patient connected to nasal cannula oxygen Cardiovascular status: blood pressure returned to baseline and stable Postop Assessment: no apparent nausea or vomiting Anesthetic complications: no   No notable events documented.   Last Vitals:  Vitals:   10/11/20 1307 10/11/20 1330  BP:  97/60  Pulse:    Resp:    Temp: (!) 35.6 C     Last Pain:  Vitals:   10/11/20 1330  TempSrc:   PainSc: 0-No pain                 [name] Michae Kava

## 2020-10-12 ENCOUNTER — Other Ambulatory Visit: Payer: Self-pay

## 2020-10-12 ENCOUNTER — Telehealth: Payer: Self-pay | Admitting: Gastroenterology

## 2020-10-12 ENCOUNTER — Encounter: Payer: Self-pay | Admitting: Gastroenterology

## 2020-10-12 DIAGNOSIS — R1310 Dysphagia, unspecified: Secondary | ICD-10-CM

## 2020-10-12 DIAGNOSIS — K222 Esophageal obstruction: Secondary | ICD-10-CM

## 2020-10-12 NOTE — Telephone Encounter (Signed)
Called patient but had to talk to his mom-Noah Marshall since she is his POA. His procedure is scheduled to be on 11/06/2020. Instructions were verbally given and I also informed Mrs. Belenda Cruise that I would be sending it through MyChart as well. She had no further questions.

## 2020-10-12 NOTE — Telephone Encounter (Signed)
Pt. Calling to schedule 2 week procedure

## 2020-10-13 LAB — SURGICAL PATHOLOGY

## 2020-10-18 ENCOUNTER — Other Ambulatory Visit: Payer: Self-pay | Admitting: *Deleted

## 2020-10-18 MED FILL — Oxcarbazepine Susp 300 MG/5ML (60 MG/ML): ORAL | 25 days supply | Qty: 500 | Fill #5 | Status: CN

## 2020-10-18 NOTE — Patient Outreach (Signed)
[facility]) Care Management  10/18/2020  Cj Edgell Forrer 15-Jul-1995 [id number]   Transition of care /Case Closure Unsuccessful outreach    Referral received:09/15/20 Initial outreach:09/18/20 Insurance: [employer]  #4 Call attempt  Unable to complete post hospital discharge transition of care assessment. No return call form patient after 3 call attempts and no response to request to contact RN Care Coordinator in unsuccessful outreach letter mailed to home on 09/18/20. Unsuccessful outreach call on today, no answer, no voice pick up.   Objective: Per the electronic medical record, Izaiyah Kleinman was hospitalized at [facility] 8/18-8/19/22  Aspiration Pneumonia,endoscopy shows esophageal stricture with food impaction.  Marland KitchenPMHx include:  Dysphagia, Autism, Epilepsy status post vagal nerve stimulator,  He was discharged to home on 09/15/20 without the need for home health services or durable medical equipment per the discharge summary.  Plan Case closed to [facility] care management services unsuccessful outreach call attempts x 4 have been unsuccessful .  Egbert Garibaldi, RN, BSN  [facility] Care Management,Care Management Coordinator  [phone]- Mobile [phone]- Toll Free Main Office

## 2020-10-19 ENCOUNTER — Other Ambulatory Visit: Payer: Self-pay

## 2020-10-19 MED FILL — Oxcarbazepine Susp 300 MG/5ML (60 MG/ML): ORAL | 25 days supply | Qty: 500 | Fill #5 | Status: AC

## 2020-10-20 ENCOUNTER — Telehealth: Payer: Self-pay

## 2020-10-20 ENCOUNTER — Other Ambulatory Visit: Payer: Self-pay

## 2020-10-20 NOTE — Telephone Encounter (Signed)
Called patient's mother-Kristin and had to leave her a voicemail letting her know that she needs to call us back so we could schedule an appointment for her son.

## 2020-10-20 NOTE — Telephone Encounter (Signed)
-----   Message from Wyline Mood, MD sent at 10/16/2020 12:44 PM EDT ----- Noah Marshall   Inform patient that he has eosinophilic esophagitis.  It is a condition that affects the esophagus due to food allergy.  Given the option of starting treatment right away or coming to the office discussing with me and then starting treatment.  If he agrees to start treatment right away commence on fluticasone inhaler for 8 weeks.  Informed him technique of inhalation into the esophagus.  Also obtain food allergy blood test at our office.  Postpone endoscopy which has been scheduled for 2 weeks since his last well.  We will plan to perform EGD and dilation after he completes 8 weeks of therapy for his eosinophilic esophagitis.  If the patient wants to discuss with me please set up office appointment  Dr Wyline Mood MD,MRCP [facility]) Gastroenterology/Hepatology Pager: [phone] '

## 2020-10-23 ENCOUNTER — Telehealth: Payer: Self-pay | Admitting: Gastroenterology

## 2020-10-23 NOTE — Telephone Encounter (Signed)
Pt.  Mother Requesting call back to see if she needs to bring her son in for another visit or is he going to go ahead with the upper endo.

## 2020-10-24 ENCOUNTER — Other Ambulatory Visit: Payer: Self-pay

## 2020-10-24 NOTE — Telephone Encounter (Signed)
Called and left mother a detail message informing her that the EGD is already schedule for 11/06/20. Asked her to call back If she has any questions

## 2020-11-06 ENCOUNTER — Encounter
Admission: RE | Disposition: A | Discharge status: Home / Self Care | Payer: Self-pay | Source: Home / Self Care | Attending: Gastroenterology

## 2020-11-06 ENCOUNTER — Ambulatory Visit
Admission: RE | Admit: 2020-11-06 | Discharge: 2020-11-06 | Disposition: A | Discharge status: Home / Self Care | Payer: 59 | Attending: Gastroenterology | Admitting: Gastroenterology

## 2020-11-06 ENCOUNTER — Ambulatory Visit: Payer: 59 | Admitting: Certified Registered Nurse Anesthetist

## 2020-11-06 ENCOUNTER — Encounter: Payer: Self-pay | Admitting: Gastroenterology

## 2020-11-06 DIAGNOSIS — K222 Esophageal obstruction: Secondary | ICD-10-CM | POA: Diagnosis not present

## 2020-11-06 DIAGNOSIS — R1310 Dysphagia, unspecified: Secondary | ICD-10-CM

## 2020-11-06 DIAGNOSIS — G40909 Epilepsy, unspecified, not intractable, without status epilepticus: Secondary | ICD-10-CM | POA: Diagnosis not present

## 2020-11-06 HISTORY — PX: ESOPHAGOGASTRODUODENOSCOPY (EGD) WITH PROPOFOL: SHX5813

## 2020-11-06 SURGERY — ESOPHAGOGASTRODUODENOSCOPY (EGD) WITH PROPOFOL
Anesthesia: Monitor Anesthesia Care

## 2020-11-06 MED ORDER — GLYCOPYRROLATE 0.2 MG/ML IJ SOLN
INTRAMUSCULAR | Status: DC | PRN
  Administered 2020-11-06: .2 mg via INTRAVENOUS

## 2020-11-06 MED ORDER — PROPOFOL 10 MG/ML IV BOLUS
INTRAVENOUS | Status: DC | PRN
  Administered 2020-11-06: 40 mg via INTRAVENOUS

## 2020-11-06 MED ORDER — PROPOFOL 500 MG/50ML IV EMUL
INTRAVENOUS | Status: DC | PRN
  Administered 2020-11-06: 140 ug/kg/min via INTRAVENOUS

## 2020-11-06 MED ORDER — LIDOCAINE HCL (CARDIAC) PF 100 MG/5ML IV SOSY
PREFILLED_SYRINGE | INTRAVENOUS | Status: DC | PRN
  Administered 2020-11-06: 100 mg via INTRAVENOUS

## 2020-11-06 MED ORDER — LIDOCAINE HCL (PF) 1 % IJ SOLN
INTRAMUSCULAR | Status: AC
  Filled 2020-11-06: qty 2

## 2020-11-06 MED ORDER — SODIUM CHLORIDE 0.9 % IV SOLN
INTRAVENOUS | Status: DC
  Administered 2020-11-06: 1000 mL via INTRAVENOUS

## 2020-11-06 NOTE — Transfer of Care (Signed)
Immediate Anesthesia Transfer of Care Note  Patient: Cem A Tumlin  Procedure(s) Performed: ESOPHAGOGASTRODUODENOSCOPY (EGD) WITH PROPOFOL  Patient Location: Endoscopy Unit  Anesthesia Type:General  Level of Consciousness: drowsy  Airway & Oxygen Therapy: Patient Spontanous Breathing  Post-op Assessment: Report given to RN and Post -op Vital signs reviewed and stable  Post vital signs: Reviewed and stable  Last Vitals:  Vitals Value Taken Time  BP 90/45 11/06/20 0939  Temp    Pulse 50 11/06/20 0939  Resp 12 11/06/20 0939  SpO2 100 % 11/06/20 0939    Last Pain:  Vitals:   11/06/20 0937  TempSrc:   PainSc: 0-No pain         Complications: No notable events documented.

## 2020-11-06 NOTE — Anesthesia Postprocedure Evaluation (Signed)
Anesthesia Post Note  Patient: Noah Marshall  Procedure(s) Performed: ESOPHAGOGASTRODUODENOSCOPY (EGD) WITH PROPOFOL  Patient location during evaluation: Endoscopy Anesthesia Type: MAC Level of consciousness: awake and alert Pain management: pain level controlled Vital Signs Assessment: post-procedure vital signs reviewed and stable Respiratory status: spontaneous breathing, nonlabored ventilation, respiratory function stable and patient connected to nasal cannula oxygen Cardiovascular status: stable and blood pressure returned to baseline Postop Assessment: no apparent nausea or vomiting Anesthetic complications: no   No notable events documented.   Last Vitals:  Vitals:   11/06/20 0957 11/06/20 1005  BP: (!) 89/51 96/62  Pulse: (!) 52 65  Resp: (!) 9 14  Temp:    SpO2: 100% 100%    Last Pain:  Vitals:   11/06/20 1005  TempSrc:   PainSc: 0-No pain                 Tonny Bollman

## 2020-11-06 NOTE — Anesthesia Procedure Notes (Signed)
Date/Time: 11/06/2020 9:34 AM Performed by: Joanette Gula, Neco Kling, CRNA Pre-anesthesia Checklist: Patient identified and Emergency Drugs available Patient Re-evaluated:Patient Re-evaluated prior to induction Oxygen Delivery Method: Simple face mask Induction Type: IV induction

## 2020-11-06 NOTE — H&P (Signed)
Wyline Mood, MD [address], [address], [place], [place], [address] [address], [address], [place], [place], [address] Phone: [phone]  Fax: [phone]  Primary Care Physician:  Malva Limes, MD   Pre-Procedure History & Physical: HPI:  Noah Marshall is a 25 y.o. male is here for an endoscopy    Past Medical History:  Diagnosis Date   Aspiration pneumonia (HCC)    Autism    Epilepsy (HCC)    Epilepsy with status epilepticus (HCC)    Esophageal stenosis    Intracranial arachnoid cysts    Pes planovalgus    Pneumonia 09/26/2015   Premature ventricular contractions    Respiratory failure (HCC)     Past Surgical History:  Procedure Laterality Date   ESOPHAGOGASTRODUODENOSCOPY (EGD) WITH PROPOFOL N/A 09/14/2020   Procedure: ESOPHAGOGASTRODUODENOSCOPY (EGD) WITH PROPOFOL;  Surgeon: Wyline Mood, MD;  [facility] ENDOSCOPY;  Service: Gastroenterology;  Laterality: N/A;   ESOPHAGOGASTRODUODENOSCOPY (EGD) WITH PROPOFOL N/A 10/11/2020   Procedure: ESOPHAGOGASTRODUODENOSCOPY (EGD) WITH PROPOFOL;  Surgeon: Wyline Mood, MD;  Location: [facility] ENDOSCOPY;  Service: Gastroenterology;  Laterality: N/A;  with dilation per Dr. Tobi Bastos   FOREIGN BODY REMOVAL N/A 03/08/2015   Procedure: FOREIGN BODY REMOVAL;  Surgeon: Elnita Maxwell, MD;  Location: [facility] ENDOSCOPY;  Service: Endoscopy;  Laterality: N/A;   IMPLANTATION VAGAL NERVE STIMULATOR  2011   REPLACE / REVISE VAGAL NERVE STIMULATOR      Prior to Admission medications   Medication Sig Start Date End Date Taking? Authorizing Provider  cetirizine HCl (ZYRTEC) 1 MG/ML solution Take 15 mg by mouth daily.   Yes [provider]  cloBAZam (ONFI) 2.5 MG/ML solution SHAKE WELL AND GIVE 6 ML BY MOUTH TWICE DAILY 08/29/20  Yes   clonazePAM (KLONOPIN) 0.5 MG tablet TAKE 1 TABLET BY MOUTH 3 TIMES DAILY. 08/17/20  Yes   guanFACINE (TENEX) 1 MG tablet TAKE 1 TABLET BY G-TUBE 2 TIMES DAILY 08/19/20  Yes   guanFACINE (TENEX) 1 MG  tablet TAKE 1 TABLET BY G-TUBE 2 TIMES DAILY 09/15/20 09/15/21 Yes Wouk, Wilfred Curtis, MD  levOCARNitine (CARNITOR) 1 GM/10ML solution Take 5 mLs (500 mg total) by mouth 2 (two) times daily 08/29/20  Yes   montelukast (SINGULAIR) 10 MG tablet Take 10 mg by mouth at bedtime.   Yes [provider]  omeprazole (PRILOSEC) 40 MG capsule Take 1 capsule (40 mg total) by mouth daily. 09/22/20  Yes Wyline Mood, MD  OXcarbazepine (TRILEPTAL) 300 MG/5ML suspension Take 600 mg by mouth 2 (two) times daily.    Yes [provider]  topiramate (TOPAMAX) 200 MG tablet Take 1 tablet (200 mg total) by mouth 2 (two) times daily Oral, crushed. No more g-tube (3 years). 08/29/20  Yes   TRILEPTAL 300 MG/5ML suspension TAKE 10 MLS BY MOUTH 2 TIMES DAILY 01/12/20 01/11/21 Yes Phylis Bougie, MD  valproic acid (DEPAKENE) 250 MG/5ML solution Take 10 mLs (500 mg total) by mouth 2 (two) times daily 08/29/20  Yes   Vitamin D, Ergocalciferol, (DRISDOL) 50000 units CAPS capsule Take 1 capsule (50,000 Units total) by mouth every 7 (seven) days. 02/19/17  Yes Shaune Pollack, MD  fluticasone [facility]) 50 MCG/ACT nasal spray Place 2 sprays into both nostrils daily.    [provider]    Allergies as of 10/12/2020 - Review Complete 10/11/2020  Allergen Reaction Noted   Lactose intolerance (gi) Diarrhea 03/08/2015   Peanut-containing drug products Hives 03/08/2015    Family History  Problem Relation  Age of Onset   Diabetes Mellitus II Father    Hypertension Father     Social History   Socioeconomic History   Marital status: Single    Spouse name: Not on file   Number of children: Not on file   Years of education: Not on file   Highest education level: Not on file  Occupational History   Not on file  Tobacco Use   Smoking status: Never   Smokeless tobacco: Never  Vaping Use   Vaping Use: Never used  Substance and Sexual Activity   Alcohol use: No   Drug use: No   Sexual activity: Never  Other  Topics Concern   Not on file  Social History Narrative   Not on file   Social Determinants of Health   Financial Resource Strain: Not on file  Food Insecurity: Not on file  Transportation Needs: Not on file  Physical Activity: Not on file  Stress: Not on file  Social Connections: Not on file  Intimate Partner Violence: Not on file    Review of Systems: See HPI, otherwise negative ROS  Physical Exam: BP 111/75   Pulse 70   Temp (!) 96 F (35.6 C) (Temporal)   Resp 16   Ht 5\' 6"  (1.676 m)   Wt 62.7 kg   SpO2 100%   BMI 22.32 kg/m  General:   Alert,  pleasant and cooperative in NAD Head:  Normocephalic and atraumatic. Neck:  Supple; no masses or thyromegaly. Lungs:  Clear throughout to auscultation, normal respiratory effort.    Heart:  +S1, +S2, Regular rate and rhythm, No edema. Abdomen:  Soft, nontender and nondistended. Normal bowel sounds, without guarding, and without rebound.   Neurologic:  Alert and  oriented x4;  grossly normal neurologically.  Impression/Plan: Noah Marshall is here for an endoscopy  to be performed for  evaluation of dysphagia    Risks, benefits, limitations, and alternatives regarding endoscopy have been reviewed with the patients guardian.  Questions have been answered.  All parties agreeable.   Micah Noel, MD  11/06/2020, 9:02 AM

## 2020-11-06 NOTE — Op Note (Signed)
[facility] Gastroenterology Patient Name: Noah Marshall Procedure Date: 11/06/2020 9:21 AM MRN: [id number] Account #: [id number] Date of Birth: [birth year] Admit Type: Outpatient Age: 25 Room: [facility] ENDO ROOM 4 Gender: Male Note Status: Finalized Instrument Name: Upper Endoscope [id number] Procedure:             Upper GI endoscopy Indications:           Dysphagia Providers:             Wyline Mood MD, MD Referring MD:          Demetrios Isaacs. Sherrie Mustache, MD (Referring MD) Medicines:             Monitored Anesthesia Care Complications:         No immediate complications. Procedure:             Pre-Anesthesia Assessment:                        - Prior to the procedure, a History and Physical was                         performed, and patient medications, allergies and                         sensitivities were reviewed. The patient's tolerance                         of previous anesthesia was reviewed.                        - The risks and benefits of the procedure and the                         sedation options and risks were discussed with the                         patient. All questions were answered and informed                         consent was obtained.                        - ASA Grade Assessment: II - A patient with mild                         systemic disease.                        After obtaining informed consent, the endoscope was                         passed under direct vision. Throughout the procedure,                         the patient's blood pressure, pulse, and oxygen                         saturations were monitored continuously. The                         Endosonoscope was introduced  through the mouth, and                         advanced to the third part of duodenum. The upper GI                         endoscopy was accomplished with ease. The patient                         tolerated the procedure well. Findings:      One benign-appearing,  intrinsic moderate (circumferential scarring or       stenosis; an endoscope may pass) stenosis was found 26 cm from the       incisors. This stenosis measured 1.2 cm (inner diameter) x 1 cm (in       length). The stenosis was traversed. A TTS dilator was passed through       the scope. Dilation with a 12-13.5-15 mm balloon dilator was performed       to 15 mm. The dilation site was examined and showed complete resolution       of luminal narrowing.      The stomach was normal.      The examined duodenum was normal.      The cardia and gastric fundus were normal on retroflexion. Impression:            - Benign-appearing esophageal stenosis. Dilated.                        - Normal stomach.                        - Normal examined duodenum.                        - No specimens collected. Recommendation:        - Discharge patient to home (with escort).                        - Resume previous diet.                        - Continue present medications.                        - Return to my office PRN. Procedure Code(s):     --- Professional ---                        [phone], Esophagogastroduodenoscopy, flexible,                         transoral; with transendoscopic balloon dilation of                         esophagus (less than 30 mm diameter) Diagnosis Code(s):     --- Professional ---                        K22.2, Esophageal obstruction                        R13.10, Dysphagia, unspecified CPT copyright 2019 American Medical Association. All rights  reserved. The codes documented in this report are preliminary and upon coder review may  be revised to meet current compliance requirements. Wyline Mood, MD Wyline Mood MD, MD 11/06/2020 9:34:06 AM This report has been signed electronically. Number of Addenda: 0 Note Initiated On: 11/06/2020 9:21 AM Estimated Blood Loss:  Estimated blood loss: none.      [facility]

## 2020-11-06 NOTE — Anesthesia Preprocedure Evaluation (Addendum)
Anesthesia Evaluation  Patient identified by MRN, date of birth, ID band Patient awake    Reviewed: Allergy & Precautions, NPO status , Patient's Chart, lab work & pertinent test results  History of Anesthesia Complications (+) DIFFICULT IV STICK / SPECIAL LINENegative for: history of anesthetic complications  Airway Mallampati: II  TM Distance: >3 FB Neck ROM: Full    Dental no notable dental hx.  Unabl;e to examine d/t pt cooperation;  Mothers attests no loose teeth:   Pulmonary asthma , pneumonia, resolved,    Pulmonary exam normal breath sounds clear to auscultation       Cardiovascular Exercise Tolerance: Good METS: 3 - Mets negative cardio ROS Normal cardiovascular exam Rhythm:Regular Rate:Normal     Neuro/Psych Seizures - (Once per week), Poorly Controlled,  PSYCHIATRIC DISORDERS (Autism)    GI/Hepatic negative GI ROS, Neg liver ROS,   Endo/Other  negative endocrine ROS  Renal/GU negative Renal ROS  negative genitourinary   Musculoskeletal negative musculoskeletal ROS (+)   Abdominal   Peds  Hematology negative hematology ROS (+)   Anesthesia Other Findings Vagal N stimulator  Reproductive/Obstetrics negative OB ROS                            Anesthesia Physical Anesthesia Plan  ASA: 3  Anesthesia Plan: MAC   Post-op Pain Management:    Induction: Intravenous  PONV Risk Score and Plan:   Airway Management Planned: Natural Airway and Nasal Cannula  Additional Equipment:   Intra-op Plan:   Post-operative Plan:   Informed Consent: I have reviewed the patients History and Physical, chart, labs and discussed the procedure including the risks, benefits and alternatives for the proposed anesthesia with the patient or authorized representative who has indicated his/her understanding and acceptance.     Dental Advisory Given  Plan Discussed with: Anesthesiologist, CRNA  and Surgeon  Anesthesia Plan Comments: (Patient consented for risks of anesthesia including but not limited to:  - adverse reactions to medications - damage to eyes, teeth, lips or other oral mucosa - nerve damage due to positioning  - sore throat or hoarseness - Damage to heart, brain, nerves, lungs, other parts of body or loss of life  Patient voiced understanding.)        Anesthesia Quick Evaluation

## 2020-11-07 ENCOUNTER — Encounter: Payer: Self-pay | Admitting: Gastroenterology

## 2020-11-15 MED FILL — Oxcarbazepine Susp 300 MG/5ML (60 MG/ML): ORAL | 25 days supply | Qty: 500 | Fill #6 | Status: AC

## 2020-11-16 ENCOUNTER — Other Ambulatory Visit: Payer: Self-pay

## 2020-11-17 ENCOUNTER — Other Ambulatory Visit: Payer: Self-pay

## 2020-11-23 ENCOUNTER — Other Ambulatory Visit: Payer: Self-pay

## 2020-11-29 ENCOUNTER — Other Ambulatory Visit: Payer: Self-pay

## 2020-11-29 ENCOUNTER — Other Ambulatory Visit: Payer: Self-pay | Admitting: Gastroenterology

## 2020-11-30 ENCOUNTER — Other Ambulatory Visit: Payer: Self-pay

## 2020-11-30 MED ORDER — OMEPRAZOLE 40 MG PO CPDR
40.0000 mg | DELAYED_RELEASE_CAPSULE | Freq: Every day | ORAL | 0 refills | Status: DC
  Filled 2020-11-30: qty 30, 30d supply, fill #0

## 2020-12-11 ENCOUNTER — Other Ambulatory Visit: Payer: Self-pay

## 2020-12-13 ENCOUNTER — Other Ambulatory Visit: Payer: Self-pay

## 2020-12-14 ENCOUNTER — Other Ambulatory Visit: Payer: Self-pay

## 2020-12-14 MED ORDER — TRILEPTAL 300 MG/5ML PO SUSP
ORAL | 11 refills | Status: DC
  Filled 2020-12-14: qty 500, 25d supply, fill #0
  Filled 2021-01-08: qty 500, 25d supply, fill #1
  Filled 2021-01-30: qty 500, 25d supply, fill #2
  Filled 2021-02-26: qty 500, 25d supply, fill #3
  Filled 2021-03-22: qty 500, 25d supply, fill #4
  Filled 2021-04-16: qty 500, 25d supply, fill #5
  Filled 2021-05-10: qty 500, 25d supply, fill #6
  Filled 2021-06-06: qty 500, 25d supply, fill #7
  Filled 2021-06-28: qty 500, 25d supply, fill #8
  Filled 2021-07-25: qty 500, 25d supply, fill #9

## 2020-12-19 ENCOUNTER — Other Ambulatory Visit: Payer: Self-pay

## 2021-01-01 ENCOUNTER — Other Ambulatory Visit: Payer: Self-pay

## 2021-01-03 ENCOUNTER — Other Ambulatory Visit: Payer: Self-pay

## 2021-01-08 ENCOUNTER — Other Ambulatory Visit: Payer: Self-pay

## 2021-01-09 ENCOUNTER — Other Ambulatory Visit: Payer: Self-pay

## 2021-01-14 ENCOUNTER — Other Ambulatory Visit: Payer: Self-pay

## 2021-01-15 ENCOUNTER — Other Ambulatory Visit: Payer: Self-pay

## 2021-01-26 ENCOUNTER — Other Ambulatory Visit: Payer: Self-pay

## 2021-01-30 ENCOUNTER — Other Ambulatory Visit: Payer: Self-pay

## 2021-02-14 ENCOUNTER — Other Ambulatory Visit: Payer: Self-pay

## 2021-02-26 ENCOUNTER — Other Ambulatory Visit: Payer: Self-pay

## 2021-02-27 ENCOUNTER — Other Ambulatory Visit: Payer: Self-pay

## 2021-02-27 MED ORDER — CLONAZEPAM 0.5 MG PO TABS
0.5000 mg | ORAL_TABLET | Freq: Three times a day (TID) | ORAL | 5 refills | Status: DC
  Filled 2021-02-27: qty 90, 30d supply, fill #0
  Filled 2021-05-08: qty 90, 30d supply, fill #1
  Filled 2021-06-27: qty 90, 30d supply, fill #2
  Filled 2021-07-23: qty 90, 30d supply, fill #3

## 2021-03-16 ENCOUNTER — Other Ambulatory Visit: Payer: Self-pay

## 2021-03-19 ENCOUNTER — Other Ambulatory Visit: Payer: Self-pay

## 2021-03-19 MED ORDER — CLOBAZAM 2.5 MG/ML PO SUSP
ORAL | 5 refills | Status: DC
  Filled 2021-03-19: qty 360, 30d supply, fill #0
  Filled 2021-04-16 (×2): qty 240, 20d supply, fill #1
  Filled 2021-04-16: qty 360, 30d supply, fill #1
  Filled 2021-04-16: qty 120, 10d supply, fill #1
  Filled 2021-05-10: qty 360, 30d supply, fill #2
  Filled 2021-06-12: qty 360, 30d supply, fill #3
  Filled 2021-07-15: qty 360, 30d supply, fill #4
  Filled 2021-08-16: qty 360, 30d supply, fill #5

## 2021-03-20 ENCOUNTER — Other Ambulatory Visit: Payer: Self-pay

## 2021-03-20 MED ORDER — CLOBAZAM 2.5 MG/ML PO SUSP
ORAL | 5 refills | Status: DC
  Filled 2021-03-20: qty 360, 30d supply, fill #0

## 2021-03-22 ENCOUNTER — Other Ambulatory Visit: Payer: Self-pay

## 2021-03-23 ENCOUNTER — Other Ambulatory Visit: Payer: Self-pay

## 2021-03-28 ENCOUNTER — Other Ambulatory Visit: Payer: Self-pay

## 2021-04-12 ENCOUNTER — Other Ambulatory Visit: Payer: Self-pay

## 2021-04-16 ENCOUNTER — Other Ambulatory Visit: Payer: Self-pay

## 2021-04-17 ENCOUNTER — Other Ambulatory Visit: Payer: Self-pay

## 2021-04-24 ENCOUNTER — Other Ambulatory Visit: Payer: Self-pay

## 2021-05-08 ENCOUNTER — Other Ambulatory Visit: Payer: Self-pay

## 2021-05-10 ENCOUNTER — Other Ambulatory Visit: Payer: Self-pay

## 2021-05-11 ENCOUNTER — Other Ambulatory Visit: Payer: Self-pay

## 2021-05-28 ENCOUNTER — Other Ambulatory Visit: Payer: Self-pay

## 2021-05-31 ENCOUNTER — Other Ambulatory Visit: Payer: Self-pay

## 2021-06-06 ENCOUNTER — Other Ambulatory Visit: Payer: Self-pay

## 2021-06-12 ENCOUNTER — Other Ambulatory Visit: Payer: Self-pay

## 2021-06-13 ENCOUNTER — Other Ambulatory Visit: Payer: Self-pay

## 2021-06-28 ENCOUNTER — Other Ambulatory Visit: Payer: Self-pay

## 2021-06-29 ENCOUNTER — Other Ambulatory Visit: Payer: Self-pay

## 2021-07-08 ENCOUNTER — Other Ambulatory Visit: Payer: Self-pay

## 2021-07-11 ENCOUNTER — Other Ambulatory Visit: Payer: Self-pay

## 2021-07-16 ENCOUNTER — Other Ambulatory Visit: Payer: Self-pay

## 2021-07-23 ENCOUNTER — Other Ambulatory Visit: Payer: Self-pay

## 2021-07-25 ENCOUNTER — Other Ambulatory Visit: Payer: Self-pay

## 2021-08-03 ENCOUNTER — Encounter: Payer: Self-pay | Admitting: Physician Assistant

## 2021-08-03 ENCOUNTER — Ambulatory Visit (INDEPENDENT_AMBULATORY_CARE_PROVIDER_SITE_OTHER): Payer: 59 | Admitting: Physician Assistant

## 2021-08-03 VITALS — BP 110/76 | HR 135 | Resp 16 | Wt 115.0 lb

## 2021-08-03 DIAGNOSIS — L89312 Pressure ulcer of right buttock, stage 2: Secondary | ICD-10-CM | POA: Diagnosis not present

## 2021-08-03 NOTE — Progress Notes (Signed)
I,Noah Marshall,acting as a Neurosurgeon for [employer], PA-C.,have documented all relevant documentation on the behalf of Noah Ferguson, PA-C,as directed by  Noah Ferguson, PA-C while in the presence of Noah Ferguson, PA-C.   Established patient visit   Patient: Noah Marshall   DOB: 1995-07-08   26 y.o. Male  MRN: [id number] Visit Date: 08/03/2021  Today's healthcare provider: Alfredia Ferguson, PA-C   Chief Complaint  Patient presents with   Wound Check   Subjective    HPI   Noah Marshall is a 26 y/o male who presents today with his parents. PMH of non verbal autism, seizure d/o. They are concerned over a possible pressure sore on his right hip/buttock as he prefers sitting and laying on that side. They have attempted using pillows, adjusting his bed, cushions when he uses a wheelchair. Pt will typically throw cushions off chair. Unable to keep it covered as he will take of bandage or try to eat it.  Denies any fevers, redness, discharge.  Larkin is entirely dependent on his parents for 24/7 care due to his diagnosis. He is unable to work, cannot leave the house without being accompanied. He is followed closely by a neurologist for his multiple conditions.   Medications: Outpatient Medications Prior to Visit  Medication Sig   cetirizine HCl (ZYRTEC) 1 MG/ML solution Take 15 mg by mouth daily.   cloBAZam (ONFI) 2.5 MG/ML solution SHAKE WELL AND GIVE 6 ML BY MOUTH TWICE DAILY   clonazePAM (KLONOPIN) 0.5 MG tablet TAKE 1 TABLET BY MOUTH 3 TIMES DAILY.   fluticasone (FLONASE) 50 MCG/ACT nasal spray Place 2 sprays into both nostrils daily.   guanFACINE (TENEX) 1 MG tablet TAKE 1 TABLET BY G-TUBE 2 TIMES DAILY   levOCARNitine (CARNITOR) 1 GM/10ML solution Take 5 mLs (500 mg total) by mouth 2 (two) times daily   montelukast (SINGULAIR) 10 MG tablet Take 10 mg by mouth at bedtime.   OXcarbazepine (TRILEPTAL) 300 MG/5ML suspension Take 600 mg by mouth 2 (two) times daily.    topiramate  (TOPAMAX) 200 MG tablet Take 1 tablet (200 mg total) by mouth 2 (two) times daily Oral, crushed. No more g-tube (3 years).   TRILEPTAL 300 MG/5ML suspension TAKE 10 MLS BY MOUTH 2 TIMES DAILY   valproic acid (DEPAKENE) 250 MG/5ML solution Take 10 mLs (500 mg total) by mouth 2 (two) times daily   omeprazole (PRILOSEC) 40 MG capsule Take 1 capsule (40 mg total) by mouth daily. (Patient not taking: Reported on 08/03/2021)   [DISCONTINUED] cloBAZam (ONFI) 2.5 MG/ML solution SHAKE WELL AND GIVE 6 ML BY MOUTH TWICE DAILY   [DISCONTINUED] guanFACINE (TENEX) 1 MG tablet TAKE 1 TABLET BY G-TUBE 2 TIMES DAILY   [DISCONTINUED] Vitamin D, Ergocalciferol, (DRISDOL) 50000 units CAPS capsule Take 1 capsule (50,000 Units total) by mouth every 7 (seven) days.   No facility-administered medications prior to visit.    Review of Systems  Skin:  Positive for wound.       Objective    BP 110/76 (BP Location: Left Arm, Patient Position: Sitting, Cuff Size: Normal)   Pulse (!) 135   Resp 16   Wt 115 lb (52.2 kg)   SpO2 99%   BMI 18.56 kg/m    Physical Exam Vitals reviewed.  Constitutional:      Appearance: He is not ill-appearing.  HENT:     Head: Normocephalic.  Eyes:     Conjunctiva/sclera: Conjunctivae normal.  Cardiovascular:     Rate  and Rhythm: Normal rate.  Pulmonary:     Effort: Pulmonary effort is normal. No respiratory distress.  Skin:    Comments: Right buttock there is hyperpigmented skin with two < 1 cm areas of skin abrasion. Not weeping.   Neurological:     General: No focal deficit present.     Mental Status: He is alert and oriented to person, place, and time.  Psychiatric:        Mood and Affect: Mood normal.        Behavior: Behavior normal.      No results found for any visits on 08/03/21.  Assessment & Plan     Problem List Items Addressed This Visit       Other   Pressure ulcer of right buttock, stage 2 (HCC) - Primary    Area is primarily stage 1 with two < 1  cm areas that are classified as stage 2.  Advised home care-- can use Vaseline or Aquaphor topically. Unable to cover d/t Medardo's condition  Advise they get creative when it comes to positioning, to encourage him to lie on his opposite side/change positions as much as possible.   Not infected. Advised close monitoring, at this point I do not see the need for a wound care consult.        Return if symptoms worsen or fail to improve.      I, Noah Ferguson, PA-C have reviewed all documentation for this visit. The documentation on  08/03/2021 for the exam, diagnosis, procedures, and orders are all accurate and complete.  Noah Ferguson, PA-C [facility] [address] [place], [place], [address] Office: [phone] Fax: [phone]   [facility]

## 2021-08-03 NOTE — Assessment & Plan Note (Addendum)
Area is primarily stage 1 with two < 1 cm areas that are classified as stage 2.  Advised home care-- can use Vaseline or Aquaphor topically. Unable to cover d/t Demian's condition  Advise they get creative when it comes to positioning, to encourage him to lie on his opposite side/change positions as much as possible.   Not infected. Advised close monitoring, at this point I do not see the need for a wound care consult.

## 2021-08-07 ENCOUNTER — Other Ambulatory Visit: Payer: Self-pay

## 2021-08-07 DIAGNOSIS — G40419 Other generalized epilepsy and epileptic syndromes, intractable, without status epilepticus: Secondary | ICD-10-CM | POA: Diagnosis not present

## 2021-08-07 DIAGNOSIS — R569 Unspecified convulsions: Secondary | ICD-10-CM | POA: Diagnosis not present

## 2021-08-07 DIAGNOSIS — G40909 Epilepsy, unspecified, not intractable, without status epilepticus: Secondary | ICD-10-CM | POA: Diagnosis not present

## 2021-08-07 DIAGNOSIS — G40901 Epilepsy, unspecified, not intractable, with status epilepticus: Secondary | ICD-10-CM | POA: Diagnosis not present

## 2021-08-07 MED ORDER — GUANFACINE HCL 1 MG PO TABS
ORAL_TABLET | ORAL | 11 refills | Status: DC
  Filled 2021-08-07: qty 60, 30d supply, fill #0
  Filled 2021-09-18: qty 60, 30d supply, fill #1
  Filled 2021-10-22: qty 60, 30d supply, fill #2
  Filled 2021-11-29: qty 60, 30d supply, fill #3
  Filled 2021-12-26: qty 50, 25d supply, fill #4
  Filled 2021-12-27: qty 10, 5d supply, fill #4
  Filled 2022-01-22: qty 60, 30d supply, fill #5
  Filled 2022-02-25: qty 60, 30d supply, fill #6
  Filled 2022-04-01: qty 60, 30d supply, fill #7

## 2021-08-07 MED ORDER — CLOBAZAM 2.5 MG/ML PO SUSP
ORAL | 5 refills | Status: DC
  Filled 2021-08-07: qty 360, 30d supply, fill #0
  Filled 2021-08-08: qty 360, fill #0
  Filled 2021-09-21: qty 360, 30d supply, fill #0
  Filled 2021-10-22: qty 360, 30d supply, fill #1
  Filled 2021-11-26: qty 360, 30d supply, fill #2
  Filled 2022-01-01: qty 360, 30d supply, fill #3

## 2021-08-07 MED ORDER — TOPIRAMATE 100 MG PO TABS
ORAL_TABLET | ORAL | 11 refills | Status: DC
  Filled 2021-08-07: qty 90, 30d supply, fill #0
  Filled 2021-09-18: qty 90, 30d supply, fill #1
  Filled 2021-10-12: qty 90, 30d supply, fill #2
  Filled 2021-11-04: qty 90, 30d supply, fill #3
  Filled 2021-11-29: qty 90, 30d supply, fill #4

## 2021-08-07 MED ORDER — LEVOCARNITINE 1 GM/10ML PO SOLN
ORAL | 11 refills | Status: AC
  Filled 2021-08-07 – 2021-09-07 (×2): qty 300, 30d supply, fill #0
  Filled 2021-10-06: qty 300, 30d supply, fill #1
  Filled 2021-11-26: qty 300, 30d supply, fill #2
  Filled 2022-01-01: qty 300, 30d supply, fill #3
  Filled 2022-02-25: qty 300, 30d supply, fill #4
  Filled 2022-04-22: qty 300, 30d supply, fill #5

## 2021-08-07 MED ORDER — VALPROIC ACID 250 MG/5ML PO SOLN
ORAL | 11 refills | Status: DC
  Filled 2021-08-07: qty 600, 30d supply, fill #0
  Filled 2021-09-05: qty 600, 30d supply, fill #1
  Filled 2021-10-03: qty 600, 30d supply, fill #2
  Filled 2021-11-01: qty 600, 30d supply, fill #3
  Filled 2021-12-03: qty 600, 30d supply, fill #4
  Filled 2022-01-01: qty 600, 30d supply, fill #5
  Filled 2022-02-06: qty 600, 30d supply, fill #6
  Filled 2022-03-04 – 2022-03-05 (×2): qty 600, 30d supply, fill #7
  Filled 2022-04-01: qty 600, 30d supply, fill #8
  Filled 2022-05-06: qty 600, 30d supply, fill #9
  Filled 2022-06-03: qty 600, 30d supply, fill #10
  Filled 2022-07-08: qty 600, 30d supply, fill #11

## 2021-08-07 MED ORDER — CLONAZEPAM 0.5 MG PO TABS
ORAL_TABLET | ORAL | 5 refills | Status: DC
  Filled 2021-08-07 – 2021-08-20 (×2): qty 90, 30d supply, fill #0
  Filled 2021-09-18: qty 90, 30d supply, fill #1
  Filled 2021-11-01: qty 90, 30d supply, fill #2
  Filled 2021-12-26: qty 90, 30d supply, fill #3

## 2021-08-07 MED ORDER — TRILEPTAL 300 MG/5ML PO SUSP
ORAL | 11 refills | Status: DC
  Filled 2021-08-07 – 2021-08-20 (×2): qty 500, 25d supply, fill #0
  Filled 2021-09-18 – 2021-09-19 (×2): qty 500, 25d supply, fill #1
  Filled 2021-10-11 (×2): qty 500, 25d supply, fill #2
  Filled 2021-11-04: qty 500, 25d supply, fill #3
  Filled 2021-12-03: qty 500, 25d supply, fill #4
  Filled 2021-12-26: qty 500, 25d supply, fill #5
  Filled 2022-01-22: qty 500, 25d supply, fill #6
  Filled 2022-02-17: qty 500, 25d supply, fill #7
  Filled 2022-03-12: qty 500, 25d supply, fill #8
  Filled 2022-04-08: qty 500, 25d supply, fill #9
  Filled 2022-05-06: qty 500, 25d supply, fill #10
  Filled 2022-05-30: qty 500, 25d supply, fill #11

## 2021-08-08 ENCOUNTER — Other Ambulatory Visit: Payer: Self-pay

## 2021-08-16 ENCOUNTER — Other Ambulatory Visit: Payer: Self-pay

## 2021-08-17 ENCOUNTER — Other Ambulatory Visit: Payer: Self-pay

## 2021-08-20 ENCOUNTER — Other Ambulatory Visit: Payer: Self-pay

## 2021-09-06 ENCOUNTER — Other Ambulatory Visit: Payer: Self-pay

## 2021-09-07 ENCOUNTER — Other Ambulatory Visit: Payer: Self-pay

## 2021-09-18 ENCOUNTER — Other Ambulatory Visit: Payer: Self-pay

## 2021-09-19 ENCOUNTER — Other Ambulatory Visit: Payer: Self-pay

## 2021-09-21 ENCOUNTER — Other Ambulatory Visit: Payer: Self-pay

## 2021-09-24 ENCOUNTER — Other Ambulatory Visit: Payer: Self-pay

## 2021-10-03 ENCOUNTER — Other Ambulatory Visit: Payer: Self-pay

## 2021-10-07 ENCOUNTER — Other Ambulatory Visit: Payer: Self-pay

## 2021-10-08 ENCOUNTER — Other Ambulatory Visit: Payer: Self-pay

## 2021-10-11 ENCOUNTER — Other Ambulatory Visit: Payer: Self-pay

## 2021-10-12 ENCOUNTER — Other Ambulatory Visit: Payer: Self-pay

## 2021-10-22 ENCOUNTER — Other Ambulatory Visit: Payer: Self-pay

## 2021-11-01 ENCOUNTER — Other Ambulatory Visit: Payer: Self-pay

## 2021-11-04 ENCOUNTER — Other Ambulatory Visit: Payer: Self-pay

## 2021-11-05 ENCOUNTER — Other Ambulatory Visit: Payer: Self-pay

## 2021-11-06 ENCOUNTER — Inpatient Hospital Stay
Admission: EM | Admit: 2021-11-06 | Discharge: 2021-11-08 | DRG: 177 | Disposition: A | Discharge status: Home / Self Care | Payer: 59 | Attending: Internal Medicine | Admitting: Internal Medicine

## 2021-11-06 ENCOUNTER — Emergency Department: Payer: 59

## 2021-11-06 DIAGNOSIS — Z7951 Long term (current) use of inhaled steroids: Secondary | ICD-10-CM | POA: Diagnosis not present

## 2021-11-06 DIAGNOSIS — G40909 Epilepsy, unspecified, not intractable, without status epilepticus: Secondary | ICD-10-CM | POA: Diagnosis not present

## 2021-11-06 DIAGNOSIS — A419 Sepsis, unspecified organism: Secondary | ICD-10-CM | POA: Diagnosis present

## 2021-11-06 DIAGNOSIS — Z79899 Other long term (current) drug therapy: Secondary | ICD-10-CM | POA: Diagnosis not present

## 2021-11-06 DIAGNOSIS — D696 Thrombocytopenia, unspecified: Secondary | ICD-10-CM | POA: Diagnosis not present

## 2021-11-06 DIAGNOSIS — R059 Cough, unspecified: Secondary | ICD-10-CM | POA: Diagnosis not present

## 2021-11-06 DIAGNOSIS — Z87892 Personal history of anaphylaxis: Secondary | ICD-10-CM

## 2021-11-06 DIAGNOSIS — I493 Ventricular premature depolarization: Secondary | ICD-10-CM | POA: Diagnosis present

## 2021-11-06 DIAGNOSIS — E871 Hypo-osmolality and hyponatremia: Secondary | ICD-10-CM | POA: Diagnosis not present

## 2021-11-06 DIAGNOSIS — E876 Hypokalemia: Secondary | ICD-10-CM | POA: Diagnosis not present

## 2021-11-06 DIAGNOSIS — Q666 Other congenital valgus deformities of feet: Secondary | ICD-10-CM

## 2021-11-06 DIAGNOSIS — F840 Autistic disorder: Secondary | ICD-10-CM | POA: Diagnosis present

## 2021-11-06 DIAGNOSIS — Z8249 Family history of ischemic heart disease and other diseases of the circulatory system: Secondary | ICD-10-CM

## 2021-11-06 DIAGNOSIS — J189 Pneumonia, unspecified organism: Secondary | ICD-10-CM

## 2021-11-06 DIAGNOSIS — R000 Tachycardia, unspecified: Secondary | ICD-10-CM | POA: Diagnosis not present

## 2021-11-06 DIAGNOSIS — R509 Fever, unspecified: Secondary | ICD-10-CM | POA: Diagnosis not present

## 2021-11-06 DIAGNOSIS — Z1152 Encounter for screening for COVID-19: Secondary | ICD-10-CM

## 2021-11-06 DIAGNOSIS — Z91011 Allergy to milk products: Secondary | ICD-10-CM

## 2021-11-06 DIAGNOSIS — K222 Esophageal obstruction: Secondary | ICD-10-CM | POA: Diagnosis not present

## 2021-11-06 DIAGNOSIS — R0602 Shortness of breath: Secondary | ICD-10-CM | POA: Diagnosis not present

## 2021-11-06 DIAGNOSIS — Z91010 Allergy to peanuts: Secondary | ICD-10-CM

## 2021-11-06 DIAGNOSIS — D649 Anemia, unspecified: Secondary | ICD-10-CM | POA: Diagnosis not present

## 2021-11-06 DIAGNOSIS — K219 Gastro-esophageal reflux disease without esophagitis: Secondary | ICD-10-CM | POA: Diagnosis present

## 2021-11-06 DIAGNOSIS — E878 Other disorders of electrolyte and fluid balance, not elsewhere classified: Secondary | ICD-10-CM | POA: Diagnosis present

## 2021-11-06 DIAGNOSIS — J690 Pneumonitis due to inhalation of food and vomit: Secondary | ICD-10-CM | POA: Diagnosis not present

## 2021-11-06 DIAGNOSIS — E861 Hypovolemia: Secondary | ICD-10-CM | POA: Diagnosis present

## 2021-11-06 DIAGNOSIS — G40009 Localization-related (focal) (partial) idiopathic epilepsy and epileptic syndromes with seizures of localized onset, not intractable, without status epilepticus: Secondary | ICD-10-CM | POA: Diagnosis not present

## 2021-11-06 LAB — URINALYSIS, ROUTINE W REFLEX MICROSCOPIC
Bilirubin Urine: NEGATIVE
Glucose, UA: NEGATIVE mg/dL
Hgb urine dipstick: NEGATIVE
Ketones, ur: NEGATIVE mg/dL
Leukocytes,Ua: NEGATIVE
Nitrite: NEGATIVE
Protein, ur: NEGATIVE mg/dL
Specific Gravity, Urine: 1.012 (ref 1.005–1.030)
pH: 7 (ref 5.0–8.0)

## 2021-11-06 LAB — COMPREHENSIVE METABOLIC PANEL
ALT: 8 U/L (ref 0–44)
AST: 20 U/L (ref 15–41)
Albumin: 3.9 g/dL (ref 3.5–5.0)
Alkaline Phosphatase: 61 U/L (ref 38–126)
Anion gap: 12 (ref 5–15)
BUN: 14 mg/dL (ref 6–20)
CO2: 21 mmol/L — ABNORMAL LOW (ref 22–32)
Calcium: 9.1 mg/dL (ref 8.9–10.3)
Chloride: 90 mmol/L — ABNORMAL LOW (ref 98–111)
Creatinine, Ser: 0.43 mg/dL — ABNORMAL LOW (ref 0.61–1.24)
GFR, Estimated: >60 mL/min (ref >60)
Glucose, Bld: 89 mg/dL (ref 70–99)
Potassium: 4.8 mmol/L (ref 3.5–5.1)
Sodium: 123 mmol/L — ABNORMAL LOW (ref 135–145)
Total Bilirubin: 0.6 mg/dL (ref 0.3–1.2)
Total Protein: 8 g/dL (ref 6.5–8.1)

## 2021-11-06 LAB — CBC WITH DIFFERENTIAL/PLATELET
Abs Immature Granulocytes: 0.02 10*3/uL (ref 0.00–0.07)
Basophils Absolute: 0 10*3/uL (ref 0.0–0.1)
Basophils Relative: 0 %
Eosinophils Absolute: 0 10*3/uL (ref 0.0–0.5)
Eosinophils Relative: 0 %
HCT: 38.1 % — ABNORMAL LOW (ref 39.0–52.0)
Hemoglobin: 12.9 g/dL — ABNORMAL LOW (ref 13.0–17.0)
Immature Granulocytes: 0 %
Lymphocytes Relative: 6 %
Lymphs Abs: 0.6 10*3/uL — ABNORMAL LOW (ref 0.7–4.0)
MCH: 30.1 pg (ref 26.0–34.0)
MCHC: 33.9 g/dL (ref 30.0–36.0)
MCV: 88.8 fL (ref 80.0–100.0)
Monocytes Absolute: 1.3 10*3/uL — ABNORMAL HIGH (ref 0.1–1.0)
Monocytes Relative: 15 %
Neutro Abs: 6.9 10*3/uL (ref 1.7–7.7)
Neutrophils Relative %: 79 %
Platelets: 136 10*3/uL — ABNORMAL LOW (ref 150–400)
RBC: 4.29 MIL/uL (ref 4.22–5.81)
RDW: 12 % (ref 11.5–15.5)
WBC: 8.8 10*3/uL (ref 4.0–10.5)
nRBC: 0 % (ref 0.0–0.2)

## 2021-11-06 LAB — SARS CORONAVIRUS 2 BY RT PCR: SARS Coronavirus 2 by RT PCR: NEGATIVE

## 2021-11-06 LAB — LACTIC ACID, PLASMA
Lactic Acid, Venous: 3 mmol/L (ref 0.5–1.9)
Lactic Acid, Venous: 0.9 mmol/L (ref 0.5–1.9)

## 2021-11-06 LAB — PROCALCITONIN: Procalcitonin: <0.1 ng/mL

## 2021-11-06 MED ORDER — LACTATED RINGERS IV SOLN: INTRAVENOUS | Status: DC

## 2021-11-06 MED ORDER — SODIUM CHLORIDE 0.9 % IV SOLN: 3.0000 g | Freq: Four times a day (QID) | INTRAVENOUS | Status: DC

## 2021-11-06 MED ORDER — ACETAMINOPHEN 325 MG PO TABS: 650.0000 mg | ORAL_TABLET | Freq: Four times a day (QID) | ORAL | Status: DC | PRN

## 2021-11-06 MED ORDER — LORAZEPAM 2 MG/ML IJ SOLN: 1.0000 mg | INTRAMUSCULAR | Status: DC | PRN

## 2021-11-06 MED ORDER — OXCARBAZEPINE 300 MG/5ML PO SUSP
600.0000 mg | Freq: Two times a day (BID) | ORAL | Status: DC
  Administered 2021-11-07 (×2): 600 mg via ORAL
  Filled 2021-11-06 (×4): qty 10

## 2021-11-06 MED ORDER — ENOXAPARIN SODIUM 40 MG/0.4ML IJ SOSY
40.0000 mg | PREFILLED_SYRINGE | INTRAMUSCULAR | Status: DC
  Administered 2021-11-06 – 2021-11-07 (×2): 40 mg via SUBCUTANEOUS
  Filled 2021-11-06 (×2): qty 0.4

## 2021-11-06 MED ORDER — GUANFACINE HCL 1 MG PO TABS
1.0000 mg | ORAL_TABLET | Freq: Two times a day (BID) | ORAL | Status: DC
  Administered 2021-11-07 (×2): 1 mg
  Filled 2021-11-06 (×4): qty 1

## 2021-11-06 MED ORDER — ONDANSETRON HCL 4 MG PO TABS: 4.0000 mg | ORAL_TABLET | Freq: Four times a day (QID) | ORAL | Status: DC | PRN

## 2021-11-06 MED ORDER — TRAZODONE HCL 50 MG PO TABS: 25.0000 mg | ORAL_TABLET | Freq: Every evening | ORAL | Status: DC | PRN

## 2021-11-06 MED ORDER — TOPIRAMATE 25 MG PO TABS
100.0000 mg | ORAL_TABLET | Freq: Every morning | ORAL | Status: DC
  Administered 2021-11-07: 100 mg via ORAL
  Filled 2021-11-06: qty 4

## 2021-11-06 MED ORDER — VALPROIC ACID 250 MG/5ML PO SOLN
500.0000 mg | Freq: Two times a day (BID) | ORAL | Status: DC
  Administered 2021-11-07 (×2): 500 mg via ORAL
  Filled 2021-11-06 (×4): qty 10

## 2021-11-06 MED ORDER — SODIUM CHLORIDE 0.9 % IV SOLN: INTRAVENOUS | Status: DC

## 2021-11-06 MED ORDER — MAGNESIUM HYDROXIDE 400 MG/5ML PO SUSP: 30.0000 mL | Freq: Every day | ORAL | Status: DC | PRN

## 2021-11-06 MED ORDER — ACETAMINOPHEN 325 MG RE SUPP
650.0000 mg | Freq: Four times a day (QID) | RECTAL | Status: DC | PRN
  Administered 2021-11-07: 650 mg via RECTAL
  Filled 2021-11-06: qty 2

## 2021-11-06 MED ORDER — CLOBAZAM 2.5 MG/ML PO SUSP: 2.5000 mg | Freq: Two times a day (BID) | ORAL | Status: DC

## 2021-11-06 MED ORDER — TOPIRAMATE 25 MG PO TABS
200.0000 mg | ORAL_TABLET | Freq: Every day | ORAL | Status: DC
  Administered 2021-11-07: 200 mg via ORAL
  Filled 2021-11-06: qty 8

## 2021-11-06 MED ORDER — SODIUM CHLORIDE 0.9 % IV SOLN
3.0000 g | Freq: Three times a day (TID) | INTRAVENOUS | Status: DC
  Administered 2021-11-06 – 2021-11-07 (×3): 3 g via INTRAVENOUS
  Filled 2021-11-06 (×3): qty 8

## 2021-11-06 MED ORDER — SODIUM CHLORIDE 0.9 % IV BOLUS
1000.0000 mL | Freq: Once | INTRAVENOUS | Status: AC
  Administered 2021-11-06: 1000 mL via INTRAVENOUS

## 2021-11-06 MED ORDER — ONDANSETRON HCL 4 MG/2ML IJ SOLN: 4.0000 mg | Freq: Four times a day (QID) | INTRAMUSCULAR | Status: DC | PRN

## 2021-11-06 MED ORDER — MONTELUKAST SODIUM 10 MG PO TABS
10.0000 mg | ORAL_TABLET | Freq: Every day | ORAL | Status: DC
  Administered 2021-11-07: 10 mg via ORAL
  Filled 2021-11-06: qty 1

## 2021-11-06 MED ORDER — SODIUM CHLORIDE 0.9 % IV BOLUS
500.0000 mL | Freq: Once | INTRAVENOUS | Status: AC
  Administered 2021-11-06: 500 mL via INTRAVENOUS

## 2021-11-06 MED ORDER — SODIUM CHLORIDE 0.9 % IV SOLN: 500.0000 mg | INTRAVENOUS | Status: DC

## 2021-11-06 MED ORDER — OXCARBAZEPINE 300 MG/5ML PO SUSP: 300.0000 mg | Freq: Two times a day (BID) | ORAL | Status: DC

## 2021-11-06 MED ORDER — FLUTICASONE PROPIONATE 50 MCG/ACT NA SUSP
2.0000 | Freq: Every day | NASAL | Status: DC
  Administered 2021-11-07: 2 via NASAL
  Filled 2021-11-06: qty 16

## 2021-11-06 MED ORDER — CETIRIZINE HCL 5 MG/5ML PO SYRP
15.0000 mg | ORAL_SOLUTION | Freq: Every day | ORAL | Status: DC
  Filled 2021-11-06 (×4): qty 15

## 2021-11-06 MED ORDER — SODIUM CHLORIDE 0.9 % IV SOLN
500.0000 mg | Freq: Once | INTRAVENOUS | Status: AC
  Administered 2021-11-06: 500 mg via INTRAVENOUS
  Filled 2021-11-06: qty 5

## 2021-11-06 MED ORDER — CLONAZEPAM 0.5 MG PO TABS
0.5000 mg | ORAL_TABLET | Freq: Three times a day (TID) | ORAL | Status: DC
  Administered 2021-11-07 (×2): 0.5 mg via ORAL
  Filled 2021-11-06 (×2): qty 1

## 2021-11-06 MED ORDER — LEVOCARNITINE 1 GM/10ML PO SOLN
500.0000 mg | Freq: Two times a day (BID) | ORAL | Status: DC
  Administered 2021-11-07 (×2): 500 mg
  Filled 2021-11-06 (×4): qty 5

## 2021-11-06 NOTE — Assessment & Plan Note (Addendum)
-   We will continue PPI therapy in IV form. 

## 2021-11-06 NOTE — Assessment & Plan Note (Signed)
-   Management as above. - Sepsis manifested by fever, tachycardia and tachypnea. - We will continue hydration with IV normal saline.

## 2021-11-06 NOTE — ED Provider Notes (Signed)
[facility] Provider Note    Event Date/Time   First MD Initiated Contact with Patient 11/06/21 1841     (approximate)   History   Aspiration   HPI  Noah Marshall is a 26 y.o. male  here with cough, low grade fever. Pt has a h/o recurrent aspiration events, eats pureed food that hsi family feeds him mostly. He also has a h/o esophageal strictures and has required dilatation in the past. Today, pt had an episode of coughing much more after eating with shortness of breath. He also became tahcycardic and agitated. Now presents for evaluation. He has had a mild increase in cough recently. Family does not recall any specific increased choking with meals however. No vomiting. No regurgitation of food.        Physical Exam   Triage Vital Signs: ED Triage Vitals [11/06/21 1833]  Enc Vitals Group     BP 125/78     Pulse Rate (!) 101     Resp (!) 22     Temp      Temp Source Rectal     SpO2 98 %     Weight      Height      Head Circumference      Peak Flow      Pain Score      Pain Loc      Pain Edu?      Excl. in [place]?     Most recent vital signs: Vitals:   11/06/21 2030 11/06/21 2130  BP: 120/74 119/72  Pulse: 98 97  Resp: 20 20  Temp:    SpO2: 99% 100%     General: Awake, no distress.  CV:  Good peripheral perfusion. Tachycardic. Resp:  Tachypnea with bilateral rhonchi/course breath sounds. Now wheezing. Abd:  No distention.  Other:  Mildly diaphoretic. At mental baseline.   ED Results / Procedures / Treatments   Labs (all labs ordered are listed, but only abnormal results are displayed) Labs Reviewed  CBC WITH DIFFERENTIAL/PLATELET - Abnormal; Notable for the following components:      Result Value   Hemoglobin 12.9 (*)    HCT 38.1 (*)    Platelets 136 (*)    Lymphs Abs 0.6 (*)    Monocytes Absolute 1.3 (*)    All other components within normal limits  COMPREHENSIVE METABOLIC PANEL - Abnormal; Notable for the following  components:   Sodium 123 (*)    Chloride 90 (*)    CO2 21 (*)    Creatinine, Ser 0.43 (*)    All other components within normal limits  LACTIC ACID, PLASMA - Abnormal; Notable for the following components:   Lactic Acid, Venous 3.0 (*)    All other components within normal limits  URINALYSIS, ROUTINE W REFLEX MICROSCOPIC - Abnormal; Notable for the following components:   Color, Urine YELLOW (*)    APPearance HAZY (*)    All other components within normal limits  SARS CORONAVIRUS 2 BY RT PCR  CULTURE, BLOOD (ROUTINE X 2)  CULTURE, BLOOD (ROUTINE X 2)  URINE CULTURE  LACTIC ACID, PLASMA  PROCALCITONIN  HIV ANTIBODY (ROUTINE TESTING W REFLEX)  BASIC METABOLIC PANEL  CBC     EKG Sinus tachycardia, VR 104. PR 155, QRS 107, QTc 409, pACs noted. NO acute ST elevations. RSR'.   RADIOLOGY CXR: bibasilar PNA   I also independently reviewed and agree with radiologist interpretations.   PROCEDURES:  Critical Care performed: Yes, see  critical care procedure note(s)  .Critical Care  Performed by: Duffy Bruce, MD Authorized by: Duffy Bruce, MD   Critical care provider statement:    Critical care time (minutes):  30   Critical care time was exclusive of:  Separately billable procedures and treating other patients   Critical care was necessary to treat or prevent imminent or life-threatening deterioration of the following conditions:  Circulatory failure, cardiac failure, respiratory failure and sepsis   Critical care was time spent personally by me on the following activities:  Development of treatment plan with patient or surrogate, discussions with consultants, evaluation of patient's response to treatment, examination of patient, ordering and review of laboratory studies, ordering and review of radiographic studies, ordering and performing treatments and interventions, pulse oximetry, re-evaluation of patient's condition and review of old [facility] ED: Medications  Ampicillin-Sulbactam (UNASYN) 3 g in sodium chloride 0.9 % 100 mL IVPB (0 g Intravenous Stopped 11/06/21 1958)  lactated ringers infusion ( Intravenous New Bag/Given 11/06/21 2107)  guanFACINE (TENEX) tablet 1 mg (has no administration in time range)  levOCARNitine (CARNITOR) 1 GM/10ML solution 500 mg (has no administration in time range)  clonazePAM (KLONOPIN) tablet 0.5 mg (has no administration in time range)  cloBAZam (ONFI) 2.5 MG/ML oral suspension 2.5 mg (has no administration in time range)  OXcarbazepine (TRILEPTAL) 300 MG/5ML suspension 600 mg (has no administration in time range)  topiramate (TOPAMAX) tablet 200 mg (has no administration in time range)  topiramate (TOPAMAX) tablet 100 mg (has no administration in time range)  valproic acid (DEPAKENE) 250 MG/5ML solution 500 mg (has no administration in time range)  cetirizine HCl (Zyrtec) 5 MG/5ML syrup 15 mg (has no administration in time range)  fluticasone (FLONASE) 50 MCG/ACT nasal spray 2 spray (has no administration in time range)  montelukast (SINGULAIR) tablet 10 mg (has no administration in time range)  enoxaparin (LOVENOX) injection 40 mg (has no administration in time range)  0.9 %  sodium chloride infusion (has no administration in time range)  acetaminophen (TYLENOL) tablet 650 mg (has no administration in time range)    Or  acetaminophen (TYLENOL) suppository 650 mg (has no administration in time range)  traZODone (DESYREL) tablet 25 mg (has no administration in time range)  magnesium hydroxide (MILK OF MAGNESIA) suspension 30 mL (has no administration in time range)  ondansetron (ZOFRAN) tablet 4 mg (has no administration in time range)    Or  ondansetron (ZOFRAN) injection 4 mg (has no administration in time range)  sodium chloride 0.9 % bolus 1,000 mL (1,000 mLs Intravenous Bolus 11/06/21 1859)  azithromycin (ZITHROMAX) 500 mg in sodium chloride 0.9 % 250 mL IVPB (0 mg Intravenous  Stopped 11/06/21 2106)  sodium chloride 0.9 % bolus 500 mL (0 mLs Intravenous Stopped 11/06/21 2106)     IMPRESSION / MDM / [place] / ED COURSE  I reviewed the triage vital signs and the nursing notes.                               The patient is on the cardiac monitor to evaluate for evidence of arrhythmia and/or significant heart rate changes.   Ddx:  Differential includes the following, with pertinent life- or limb-threatening emergencies considered:  Aspiration PNA, CAP, esophageal stricture w/ aspiration, COVID-19, viral illness, ACS, CHF  Patient's presentation is most consistent with acute presentation with potential threat to life or bodily  function.  MDM:  26 yo M with h/o autism, epilepsy, esophageal stenosis, here with fever, cough, aspiration. Pt febrile, tachycardic on arrival. CXR shows bibasilar PNA. CBC without leukocytosis or anemia. CMP with acute on chronic hyponatremia likely from vomiting/hypovolemia. LA 3, cleared with fluids.   Pt started on empiric aspiration PNA coverage, given IVF and will admit to medicine. Mother updated and is in agreement with this plan.    MEDICATIONS GIVEN IN ED: Medications  Ampicillin-Sulbactam (UNASYN) 3 g in sodium chloride 0.9 % 100 mL IVPB (0 g Intravenous Stopped 11/06/21 1958)  lactated ringers infusion ( Intravenous New Bag/Given 11/06/21 2107)  guanFACINE (TENEX) tablet 1 mg (has no administration in time range)  levOCARNitine (CARNITOR) 1 GM/10ML solution 500 mg (has no administration in time range)  clonazePAM (KLONOPIN) tablet 0.5 mg (has no administration in time range)  cloBAZam (ONFI) 2.5 MG/ML oral suspension 2.5 mg (has no administration in time range)  OXcarbazepine (TRILEPTAL) 300 MG/5ML suspension 600 mg (has no administration in time range)  topiramate (TOPAMAX) tablet 200 mg (has no administration in time range)  topiramate (TOPAMAX) tablet 100 mg (has no administration in time range)  valproic  acid (DEPAKENE) 250 MG/5ML solution 500 mg (has no administration in time range)  cetirizine HCl (Zyrtec) 5 MG/5ML syrup 15 mg (has no administration in time range)  fluticasone (FLONASE) 50 MCG/ACT nasal spray 2 spray (has no administration in time range)  montelukast (SINGULAIR) tablet 10 mg (has no administration in time range)  enoxaparin (LOVENOX) injection 40 mg (has no administration in time range)  0.9 %  sodium chloride infusion (has no administration in time range)  acetaminophen (TYLENOL) tablet 650 mg (has no administration in time range)    Or  acetaminophen (TYLENOL) suppository 650 mg (has no administration in time range)  traZODone (DESYREL) tablet 25 mg (has no administration in time range)  magnesium hydroxide (MILK OF MAGNESIA) suspension 30 mL (has no administration in time range)  ondansetron (ZOFRAN) tablet 4 mg (has no administration in time range)    Or  ondansetron (ZOFRAN) injection 4 mg (has no administration in time range)  sodium chloride 0.9 % bolus 1,000 mL (1,000 mLs Intravenous Bolus 11/06/21 1859)  azithromycin (ZITHROMAX) 500 mg in sodium chloride 0.9 % 250 mL IVPB (0 mg Intravenous Stopped 11/06/21 2106)  sodium chloride 0.9 % bolus 500 mL (0 mLs Intravenous Stopped 11/06/21 2106)     Consults:  Hospitalist   EMR reviewed       FINAL CLINICAL IMPRESSION(S) / ED DIAGNOSES   Final diagnoses:  Aspiration pneumonia of both lower lobes, unspecified aspiration pneumonia type ([place])     Rx / DC Orders   ED Discharge Orders     None        Note:  This document was prepared using Dragon voice recognition software and may include unintentional dictation errors.   Duffy Bruce, MD 11/06/21 2233

## 2021-11-06 NOTE — ED Triage Notes (Signed)
Pt arrived via [facility] from home with call out for aspiration after eating. Pt is non-verbal/seizures/artistic. Pt with temp if 100.34F with EMS. Pt coughing on arrival with non-productive coughing. Lower lobes on auscultation tight with little air exchange.

## 2021-11-06 NOTE — Assessment & Plan Note (Addendum)
-   The patient will be admitted to a medical telemetry bed. - We will continue antibiotic therapy with IV Unasyn and Zithromax. - We will follow blood cultures. - Bronchodilator therapy and mucolytic therapy will be provided. - Speech therapy consult to be obtained. - He will be kept n.p.o. for now.

## 2021-11-06 NOTE — ED Notes (Signed)
Dr. Sidney Ace notified that patient no longer has G-Tube, mother states patient pulled it out years ago. Dr. Sidney Ace aware that RN is holding PO medications until patient is cleared by speech therapy as he is being admitted for aspiration pneumonia.

## 2021-11-06 NOTE — Consult Note (Signed)
CODE SEPSIS - PHARMACY COMMUNICATION  **Broad Spectrum Antibiotics should be administered within 1 hour of Sepsis diagnosis**  Time Code Sepsis Called/Page Received: 10/10 @ 2003  Antibiotics Ordered: Unasyn 3 g IV q8H, Azithromycin 500 mg IV x 1  Time of 1st antibiotic administration: 10/10 @ 1925  Additional action taken by pharmacy: N/A  If necessary, Name of Provider/Nurse Contacted: N/A  Dara Hoyer, PharmD PGY-1 Pharmacy Resident 11/06/2021 8:22 PM

## 2021-11-06 NOTE — Consult Note (Signed)
PHARMACY -  BRIEF ANTIBIOTIC NOTE   Pharmacy has received consult(s) for  from an ED provider.  The patient's profile has been reviewed for ht/wt/allergies/indication/available labs.    One time order(s) placed for Azithromycin 500 mg IV and Unasyn 3 g IV  Further antibiotics/pharmacy consults should be ordered by admitting physician if indicated.                       Thank you, Delena Bali 11/06/2021  8:24 PM

## 2021-11-06 NOTE — Sepsis Progress Note (Signed)
Following for sepsis monitoring ?

## 2021-11-06 NOTE — ED Notes (Signed)
Patient resting in bed with family at bedside. Alert, tracks with eyes but nonverbal, not following commands. Respirations unlabored but slight tachypnea on room air. External male catheter applied. Positioned for comfort. SR-ST on monitor 90-100. Bed alarm on.

## 2021-11-06 NOTE — H&P (Signed)
[place]   PATIENT NAME: Noah Marshall    MR#:  [id number]  DATE OF BIRTH:  [birth year]  DATE OF ADMISSION:  11/06/2021  PRIMARY CARE PHYSICIAN: Birdie Sons, MD   Patient is coming from: Home  REQUESTING/REFERRING PHYSICIAN: Duffy Bruce, MD  CHIEF COMPLAINT:   Chief Complaint  Patient presents with   Aspiration    HISTORY OF PRESENT ILLNESS:  Noah Marshall is a 26 y.o. male with medical history significant for autism, epilepsy, CP and esophageal stenosis, who presented to the emergency room with acute onset of cough with low-grade fever.  He has a history of recurrent aspiration events eating.  He ate food that his family feeds him.  He has required dilatation of his esophageal stricture in the past.  Experience significant dyspnea with associated cough after eating earlier today became tachycardic and agitated.  This has been getting worse with no worsening choking though compared to previous baseline status.  No vomiting or regurgitation of food.  He had associated chest congestion with inability to expectorate.  No dysuria, oliguria or hematuria or flank pain.  No headache or dizziness or blurred vision.  ED Course: When he came to the ER, temperature was 100.6 with heart rate of 101 with respiratory to 22 and Pulsoxymeter was 90% on room air.  Labs revealed with mild anemia and thrombocytopenia, hyponatremia 123 with hypochloremia of 90.  Lactic acid was 3 and later on 0.9. EKG as reviewed by me : EKG showed sinus tachycardia with rate 104 with ventricular bigeminy and RSR-in V1 and V2. Imaging: Portable chest x-ray showed patchy bilateral lower lobe opacities, left greater than right suspicious for multifocal pneumonia.  The patient was given IV Unasyn and Zithromax.  He will be admitted to a medical telemetry bed for further evaluation and management. PAST MEDICAL HISTORY:   Past Medical History:  Diagnosis Date   Aspiration pneumonia ([place])    Autism     Epilepsy ([place])    Epilepsy with status epilepticus ([place])    Esophageal stenosis    Intracranial arachnoid cysts    Pes planovalgus    Pneumonia 09/26/2015   Premature ventricular contractions    Respiratory failure ([place])     PAST SURGICAL HISTORY:   Past Surgical History:  Procedure Laterality Date   ESOPHAGOGASTRODUODENOSCOPY (EGD) WITH PROPOFOL N/A 09/14/2020   Procedure: ESOPHAGOGASTRODUODENOSCOPY (EGD) WITH PROPOFOL;  Surgeon: Jonathon Bellows, MD;  Location: [facility] ENDOSCOPY;  Service: Gastroenterology;  Laterality: N/A;   ESOPHAGOGASTRODUODENOSCOPY (EGD) WITH PROPOFOL N/A 10/11/2020   Procedure: ESOPHAGOGASTRODUODENOSCOPY (EGD) WITH PROPOFOL;  Surgeon: Jonathon Bellows, MD;  Location: [facility] ENDOSCOPY;  Service: Gastroenterology;  Laterality: N/A;  with dilation per Dr. Vicente Males   ESOPHAGOGASTRODUODENOSCOPY (EGD) WITH PROPOFOL N/A 11/06/2020   Procedure: ESOPHAGOGASTRODUODENOSCOPY (EGD) WITH PROPOFOL;  Surgeon: Jonathon Bellows, MD;  Location: [facility] ENDOSCOPY;  Service: Gastroenterology;  Laterality: N/A;   FOREIGN BODY REMOVAL N/A 03/08/2015   Procedure: FOREIGN BODY REMOVAL;  Surgeon: Josefine Class, MD;  Location: [facility] ENDOSCOPY;  Service: Endoscopy;  Laterality: N/A;   IMPLANTATION VAGAL NERVE STIMULATOR  2011   REPLACE / REVISE VAGAL NERVE STIMULATOR      SOCIAL HISTORY:   Social History   Tobacco Use   Smoking status: Never   Smokeless tobacco: Never  Substance Use Topics   Alcohol use: No    FAMILY HISTORY:   Family History  Problem Relation Age of Onset   Diabetes Mellitus II Father    Hypertension Father  DRUG ALLERGIES:   Allergies  Allergen Reactions   Other Diarrhea, Hives and Itching    Dairy   Peanut-Containing Drug Products Hives and Anaphylaxis   Lactose Intolerance (Gi) Diarrhea    REVIEW OF SYSTEMS:   ROS As per history of present illness. All pertinent systems were reviewed above. Constitutional, HEENT, cardiovascular, respiratory, GI, GU,  musculoskeletal, neuro, psychiatric, endocrine, integumentary and hematologic systems were reviewed and are otherwise negative/unremarkable except for positive findings mentioned above in the HPI.   MEDICATIONS AT HOME:   Prior to Admission medications   Medication Sig Start Date End Date Taking? Authorizing Provider  cetirizine HCl (ZYRTEC) 1 MG/ML solution Take 15 mg by mouth daily.   Yes [provider]  cloBAZam (ONFI) 2.5 MG/ML solution SHAKE WELL AND GIVE 6 ML BY MOUTH TWICE DAILY. Strength: 2.5 mg/mL 08/07/21  Yes   clonazePAM (KLONOPIN) 0.5 MG tablet TAKE 1 TABLET BY MOUTH 3 TIMES DAILY. 02/27/21  Yes   fluticasone (FLONASE) 50 MCG/ACT nasal spray Place 2 sprays into both nostrils daily.   Yes [provider]  guanFACINE (TENEX) 1 MG tablet TAKE 1 TABLET BY G-TUBE 2 TIMES DAILY 08/19/20  Yes   levOCARNitine (CARNITOR) 1 GM/10ML solution Take 5 mLs (500 mg total) by mouth 2 (two) times daily 08/29/20  Yes   montelukast (SINGULAIR) 10 MG tablet Take 10 mg by mouth at bedtime.   Yes [provider]  topiramate (TOPAMAX) 100 MG tablet Take 1 tablet (100 mg total) by mouth every morning AND 2 tablets (200 mg total) at bedtime. Oral, crushed. No more g-tube (3 years).. Patient taking differently: Take 100-200 mg by mouth 2 (two) times daily. 08/07/21  Yes   TRILEPTAL 300 MG/5ML suspension TAKE 10 MLS BY MOUTH 2 TIMES DAILY 12/14/20  Yes   valproic acid (DEPAKENE) 250 MG/5ML solution Take 10 mLs (500 mg total) by mouth 2 (two) times daily 08/29/20  Yes   cloBAZam (ONFI) 2.5 MG/ML solution SHAKE WELL AND GIVE 6 ML BY MOUTH TWICE DAILY 03/19/21     clonazePAM (KLONOPIN) 0.5 MG tablet Take 1 tablet (0.5 mg total) by mouth 3 (three) times daily 08/07/21     guanFACINE (TENEX) 1 MG tablet TAKE 1 TABLET BY G-TUBE 2 TIMES DAILY Strength: 1 mg 08/07/21     levOCARNitine (CARNITOR) 1 GM/10ML solution Take 5 mLs (500 mg total) by mouth 2 (two) times daily 08/07/21     omeprazole  (PRILOSEC) 40 MG capsule Take 1 capsule (40 mg total) by mouth daily. Patient not taking: Reported on 08/03/2021 11/30/20   Wyline Mood, MD  OXcarbazepine (TRILEPTAL) 300 MG/5ML suspension Take 600 mg by mouth 2 (two) times daily.  Patient not taking: Reported on 11/06/2021    [provider]  topiramate (TOPAMAX) 200 MG tablet Take 1 tablet (200 mg total) by mouth 2 (two) times daily Oral, crushed. No more g-tube (3 years). 08/29/20     TRILEPTAL 300 MG/5ML suspension TAKE 10 MLS BY MOUTH 2 TIMES DAILY Strength: 300 mg/5 mL (60 mg/mL) 08/07/21     valproic acid (DEPAKENE) 250 MG/5ML solution Take 10 mLs (500 mg total) by mouth 2 (two) times daily 08/07/21         VITAL SIGNS:  Blood pressure 119/72, pulse 97, temperature 98.8 F (37.1 C), temperature source Axillary, resp. rate 20, height 5\' 6"  (1.676 m), weight 52.2 kg, SpO2 100 %.  PHYSICAL EXAMINATION:  Physical Exam  GENERAL:  26 y.o.-year-old male patient lying in the bed  with no acute distress.  EYES: Pupils equal, round, reactive to light and accommodation. No scleral icterus. Extraocular muscles intact.  HEENT: Head atraumatic, normocephalic. Oropharynx and nasopharynx clear.  NECK:  Supple, no jugular venous distention. No thyroid enlargement, no tenderness.  LUNGS: Diminished bibasilar breath sounds with bibasal crackles.  No use of accessory muscles of respiration.  CARDIOVASCULAR: Regular rate and rhythm, S1, S2 normal. No murmurs, rubs, or gallops.  ABDOMEN: Soft, nondistended, nontender. Bowel sounds present. No organomegaly or mass.  EXTREMITIES: No pedal edema, cyanosis, or clubbing.  NEUROLOGIC: Cranial nerves II through XII are intact. Muscle strength 5/5 in all extremities. Sensation intact. Gait not checked.  PSYCHIATRIC: The patient is alert and oriented x 3.  Normal affect and good eye contact. SKIN: No obvious rash, lesion, or ulcer.   LABORATORY PANEL:   CBC Recent Labs  Lab 11/06/21 1845  WBC 8.8  HGB  12.9*  HCT 38.1*  PLT 136*   ------------------------------------------------------------------------------------------------------------------  Chemistries  Recent Labs  Lab 11/06/21 1845  NA 123*  K 4.8  CL 90*  CO2 21*  GLUCOSE 89  BUN 14  CREATININE 0.43*  CALCIUM 9.1  AST 20  ALT 8  ALKPHOS 61  BILITOT 0.6   ------------------------------------------------------------------------------------------------------------------  Cardiac Enzymes No results for input(s): "TROPONINI" in the last 168 hours. ------------------------------------------------------------------------------------------------------------------  RADIOLOGY:  DG Chest Portable 1 View  Result Date: 11/06/2021 CLINICAL DATA:  Shortness of breath, cough, low-grade fever EXAM: PORTABLE CHEST 1 VIEW COMPARISON:  09/14/2020 FINDINGS: Patchy bilateral lower lobe opacities, left greater than right, suspicious for multifocal pneumonia. No pleural effusion or pneumothorax. The heart is normal in size. Neck stimulator. IMPRESSION: Patchy bilateral lower lobe opacities, left greater than right, suspicious for multifocal pneumonia. Electronically Signed   By: Charline Bills M.D.   On: 11/06/2021 19:14      IMPRESSION AND PLAN:  Assessment and Plan: Aspiration pneumonia [facility]) - The patient will be admitted to a medical telemetry bed. - We will continue antibiotic therapy with IV Unasyn and Zithromax. - We will follow blood cultures. - Bronchodilator therapy and mucolytic therapy will be provided. - Speech therapy consult to be obtained. - He will be kept n.p.o. for now.  Sepsis due to pneumonia [facility]) - Management as above. - Sepsis manifested by fever, tachycardia and tachypnea. - We will continue hydration with IV normal saline.  Hyponatremia - We will continue hydration with IV normal saline and follow sodium level.  Epilepsy (HCC) -We will place him on as needed IV Ativan for seizure disorder. -  Antiseizure medications with will be resumed as tolerated to avoid aspiration.  GERD without esophagitis - We will continue PPI therapy in IV form.    DVT prophylaxis: Lovenox. Advanced Care Planning:  Code Status: full code.  This was discussed with his mother. Family Communication:  The plan of care was discussed in details with the patient (and family). I answered all questions. The patient agreed to proceed with the above mentioned plan. Further management will depend upon hospital course. Disposition Plan: Back to previous home environment Consults called: none. All the records are reviewed and case discussed with ED provider.  Status is: Inpatient with   At the time of the admission, it appears that the appropriate admission status for this patient is inpatient.  This is judged to be reasonable and necessary in order to provide the required intensity of service to ensure the patient's safety given the presenting symptoms, physical exam findings and initial radiographic and  laboratory data in the context of comorbid conditions.  The patient requires inpatient status due to high intensity of service, high risk of further deterioration and high frequency of surveillance required.  I certify that at the time of admission, it is my clinical judgment that the patient will require inpatient hospital care extending more than 2 midnights.                            Dispo: The patient is from: Home              Anticipated d/c is to: Home              Patient currently is not medically stable to d/c.              Difficult to place patient: No  Hannah Beat M.D on 11/06/2021 at 11:56 PM  [facility]   From 7 PM-7 AM, contact night-coverage [email or web address]  CC: Primary care physician; Malva Limes, MD

## 2021-11-06 NOTE — Assessment & Plan Note (Addendum)
-  We will place him on as needed IV Ativan for seizure disorder. - Antiseizure medications with will be resumed as tolerated to avoid aspiration.

## 2021-11-06 NOTE — ED Notes (Signed)
Seizure pads placed on side rails. Oral suction set up at bedside. Mother with patient. States patient's last seizure was Thursday.

## 2021-11-06 NOTE — Assessment & Plan Note (Addendum)
-   We will continue hydration with IV normal saline and follow sodium level. - This is likely hypovolemic. - We will obtain hyponatremia work-up.

## 2021-11-07 ENCOUNTER — Encounter: Payer: Self-pay | Admitting: Family Medicine

## 2021-11-07 DIAGNOSIS — E876 Hypokalemia: Secondary | ICD-10-CM

## 2021-11-07 DIAGNOSIS — G40009 Localization-related (focal) (partial) idiopathic epilepsy and epileptic syndromes with seizures of localized onset, not intractable, without status epilepticus: Secondary | ICD-10-CM

## 2021-11-07 LAB — CBC
HCT: 28.4 % — ABNORMAL LOW (ref 39.0–52.0)
Hemoglobin: 10 g/dL — ABNORMAL LOW (ref 13.0–17.0)
MCH: 30.2 pg (ref 26.0–34.0)
MCHC: 35.2 g/dL (ref 30.0–36.0)
MCV: 85.8 fL (ref 80.0–100.0)
Platelets: 110 10*3/uL — ABNORMAL LOW (ref 150–400)
RBC: 3.31 MIL/uL — ABNORMAL LOW (ref 4.22–5.81)
RDW: 11.9 % (ref 11.5–15.5)
WBC: 9.9 10*3/uL (ref 4.0–10.5)
nRBC: 0 % (ref 0.0–0.2)

## 2021-11-07 LAB — OSMOLALITY: Osmolality: 264 mOsm/kg — ABNORMAL LOW (ref 275–295)

## 2021-11-07 LAB — BASIC METABOLIC PANEL
Anion gap: 8 (ref 5–15)
BUN: 8 mg/dL (ref 6–20)
CO2: 24 mmol/L (ref 22–32)
Calcium: 8.3 mg/dL — ABNORMAL LOW (ref 8.9–10.3)
Chloride: 97 mmol/L — ABNORMAL LOW (ref 98–111)
Creatinine, Ser: <0.3 mg/dL — ABNORMAL LOW (ref 0.61–1.24)
Glucose, Bld: 92 mg/dL (ref 70–99)
Potassium: 3.3 mmol/L — ABNORMAL LOW (ref 3.5–5.1)
Sodium: 129 mmol/L — ABNORMAL LOW (ref 135–145)

## 2021-11-07 LAB — NA AND K (SODIUM & POTASSIUM), RAND UR
Potassium Urine: 39 mmol/L
Sodium, Ur: 68 mmol/L

## 2021-11-07 LAB — MAGNESIUM: Magnesium: 1.8 mg/dL (ref 1.7–2.4)

## 2021-11-07 LAB — APTT: aPTT: 49 seconds — ABNORMAL HIGH (ref 24–36)

## 2021-11-07 LAB — PROTIME-INR
INR: 1.3 — ABNORMAL HIGH (ref 0.8–1.2)
Prothrombin Time: 16.2 seconds — ABNORMAL HIGH (ref 11.4–15.2)

## 2021-11-07 LAB — OSMOLALITY, URINE: Osmolality, Ur: 416 mOsm/kg (ref 300–900)

## 2021-11-07 MED ORDER — SODIUM CHLORIDE 0.9 % IV SOLN
3.0000 g | Freq: Four times a day (QID) | INTRAVENOUS | Status: DC
  Administered 2021-11-07 – 2021-11-08 (×4): 3 g via INTRAVENOUS
  Filled 2021-11-07 (×3): qty 8

## 2021-11-07 MED ORDER — SODIUM CHLORIDE 0.9 % IV SOLN: INTRAVENOUS | Status: DC

## 2021-11-07 MED ORDER — POTASSIUM CHLORIDE IN NACL 40-0.9 MEQ/L-% IV SOLN: INTRAVENOUS | Status: DC

## 2021-11-07 MED ORDER — CLOBAZAM 2.5 MG/ML PO SUSP
15.0000 mg | Freq: Two times a day (BID) | ORAL | Status: DC
  Administered 2021-11-07 (×2): 15 mg via ORAL
  Filled 2021-11-07 (×3): qty 8

## 2021-11-07 MED ORDER — POTASSIUM CHLORIDE IN NACL 40-0.9 MEQ/L-% IV SOLN
INTRAVENOUS | Status: AC
  Filled 2021-11-07 (×2): qty 1000

## 2021-11-07 NOTE — Progress Notes (Addendum)
Progress Note    Noah Marshall  OZH:[id number] DOB: 1996-01-16  DOA: 11/06/2021 PCP: Malva Limes, MD      Brief Narrative:    Medical records reviewed and are as summarized below:  Noah Marshall is a 26 y.o. male with medical history significant for aspiration pneumonia, autism, epilepsy, esophageal stenosis requiring esophageal dilation in the past, history of respiratory failure, intracranial arachnoid cyst, pes planovalgus, PVCs, was brought to the emergency department because of cough, low-grade fever and significant shortness of breath that occurred after eating.  Reportedly, he was tachycardic and agitated.  He was admitted to the hospital for sepsis secondary to multifocal pneumonia, suspected to be aspiration pneumonia.  He was also hyponatremic.  He was treated with IV fluids and empiric IV antibiotics.     Assessment/Plan:   Active Problems:   Aspiration pneumonia (HCC)   Sepsis due to pneumonia (HCC)   Hyponatremia   Epilepsy (HCC)   GERD without esophagitis   Hypokalemia   Body mass index is 18.56 kg/m.  Sepsis secondary to multifocal pneumonia, suspected aspiration pneumonia: Continue IV Unasyn.  Discontinue azithromycin.  Hyponatremia: Improving.  Continue IV normal saline infusion but decrease rate from 100 mL/h to 75 mL/h.  Repeat BMP tomorrow.  Hypokalemia: Potassium chloride 40 mEq in 1 L bag of normal saline.  Check magnesium level replete as needed.  Repeat BMP tomorrow  Other comorbidities include epilepsy, autism    Diet Order             DIET - DYS 1 Room service appropriate? No; Fluid consistency: Nectar Thick  Diet effective now                            Consultants: None  Procedures: None    Medications:    cetirizine HCl  15 mg Oral Daily   cloBAZam  15 mg Oral BID   clonazePAM  0.5 mg Oral TID   enoxaparin (LOVENOX) injection  40 mg Subcutaneous Q24H   fluticasone  2 spray Each Nare Daily    guanFACINE  1 mg Per Tube BID   levOCARNitine  500 mg Per Tube BID   montelukast  10 mg Oral QHS   OXcarbazepine  600 mg Oral BID   topiramate  100 mg Oral q morning   topiramate  200 mg Oral QHS   valproic acid  500 mg Oral BID   Continuous Infusions:  0.9 % NaCl with KCl 40 mEq / L     ampicillin-sulbactam (UNASYN) IV       Anti-infectives (From admission, onward)    Start     Dose/Rate Route Frequency Ordered Stop   11/07/21 2000  azithromycin (ZITHROMAX) 500 mg in sodium chloride 0.9 % 250 mL IVPB  Status:  Discontinued        500 mg 250 mL/hr over 60 Minutes Intravenous Every 24 hours 11/06/21 2350 11/07/21 1451   11/07/21 1700  Ampicillin-Sulbactam (UNASYN) 3 g in sodium chloride 0.9 % 100 mL IVPB        3 g 200 mL/hr over 30 Minutes Intravenous Every 6 hours 11/07/21 1156     11/07/21 0000  Ampicillin-Sulbactam (UNASYN) 3 g in sodium chloride 0.9 % 100 mL IVPB  Status:  Discontinued        3 g 200 mL/hr over 30 Minutes Intravenous Every 6 hours 11/06/21 2350 11/06/21 2357   11/06/21 1930  azithromycin (ZITHROMAX)  500 mg in sodium chloride 0.9 % 250 mL IVPB        500 mg 250 mL/hr over 60 Minutes Intravenous  Once 11/06/21 1917 11/06/21 2106   11/06/21 1930  Ampicillin-Sulbactam (UNASYN) 3 g in sodium chloride 0.9 % 100 mL IVPB  Status:  Discontinued        3 g 200 mL/hr over 30 Minutes Intravenous Every 8 hours 11/06/21 1918 11/07/21 1156              Family Communication/Anticipated D/C date and plan/Code Status   DVT prophylaxis: enoxaparin (LOVENOX) injection 40 mg Start: 11/06/21 2200     Code Status: Full Code  Family Communication: Plan discussed with his mother at the bedside Disposition Plan: Plan to discharge home tomorrow   Status is: Inpatient Remains inpatient appropriate because: IV antibiotics for pneumonia       Subjective:   Interval events noted.  Patient is autistic and nonverbal and cannot provide any history.  History was  obtained from his mother at the bedside.  Objective:    Vitals:   11/07/21 1130 11/07/21 1300 11/07/21 1400 11/07/21 1430  BP: (!) 140/79 134/75 124/71 127/73  Pulse: 96 98 98 89  Resp: 20 (!) 28 (!) 24 (!) 22  Temp:      TempSrc:      SpO2: 96% 94% 95% 96%  Weight:      Height:       No data found.   Intake/Output Summary (Last 24 hours) at 11/07/2021 1459 Last data filed at 11/07/2021 1459 Gross per 24 hour  Intake 948.71 ml  Output 600 ml  Net 348.71 ml   Filed Weights   11/06/21 2239  Weight: 52.2 kg    Exam:  GEN: NAD SKIN: Warm and dry EYES: EOMI ENT: MMM CV: RRR PULM: CTA B ABD: soft, ND, NT, +BS CNS: Alert but nonverbal EXT: No edema or tenderness        Data Reviewed:   I have personally reviewed following labs and imaging studies:  Labs: Labs show the following:   Basic Metabolic Panel: Recent Labs  Lab 11/06/21 1845 11/07/21 0625  NA 123* 129*  K 4.8 3.3*  CL 90* 97*  CO2 21* 24  GLUCOSE 89 92  BUN 14 8  CREATININE 0.43* <0.30*  CALCIUM 9.1 8.3*   GFR CrCl cannot be calculated (This lab value cannot be used to calculate CrCl because it is not a number: <0.30). Liver Function Tests: Recent Labs  Lab 11/06/21 1845  AST 20  ALT 8  ALKPHOS 61  BILITOT 0.6  PROT 8.0  ALBUMIN 3.9   No results for input(s): "LIPASE", "AMYLASE" in the last 168 hours. No results for input(s): "AMMONIA" in the last 168 hours. Coagulation profile Recent Labs  Lab 11/07/21 0625  INR 1.3*    CBC: Recent Labs  Lab 11/06/21 1845 11/07/21 0625  WBC 8.8 9.9  NEUTROABS 6.9  --   HGB 12.9* 10.0*  HCT 38.1* 28.4*  MCV 88.8 85.8  PLT 136* 110*   Cardiac Enzymes: No results for input(s): "CKTOTAL", "CKMB", "CKMBINDEX", "TROPONINI" in the last 168 hours. BNP (last 3 results) No results for input(s): "PROBNP" in the last 8760 hours. CBG: No results for input(s): "GLUCAP" in the last 168 hours. D-Dimer: No results for input(s): "DDIMER"  in the last 72 hours. Hgb A1c: No results for input(s): "HGBA1C" in the last 72 hours. Lipid Profile: No results for input(s): "CHOL", "HDL", "LDLCALC", "TRIG", "CHOLHDL", "  LDLDIRECT" in the last 72 hours. Thyroid function studies: No results for input(s): "TSH", "T4TOTAL", "T3FREE", "THYROIDAB" in the last 72 hours.  Invalid input(s): "FREET3" Anemia work up: No results for input(s): "VITAMINB12", "FOLATE", "FERRITIN", "TIBC", "IRON", "RETICCTPCT" in the last 72 hours. Sepsis Labs: Recent Labs  Lab 11/06/21 1845 11/06/21 2135 11/07/21 0625  PROCALCITON <0.10  --   --   WBC 8.8  --  9.9  LATICACIDVEN 3.0* 0.9  --     Microbiology Recent Results (from the past 240 hour(s))  Blood culture (routine x 2)     Status: None (Preliminary result)   Collection Time: 11/06/21  6:35 PM   Specimen: BLOOD  Result Value Ref Range Status   Specimen Description BLOOD BLOOD RIGHT FOREARM  Final   Special Requests   Final    BOTTLES DRAWN AEROBIC AND ANAEROBIC Blood Culture results may not be optimal due to an inadequate volume of blood received in culture bottles   Culture   Final    NO GROWTH < 12 HOURS Performed at [facility], [address] [place], [place] [address]    Report Status PENDING  Incomplete  Blood culture (routine x 2)     Status: None (Preliminary result)   Collection Time: 11/06/21  6:40 PM   Specimen: BLOOD  Result Value Ref Range Status   Specimen Description BLOOD BLOOD LEFT ARM  Final   Special Requests   Final    BOTTLES DRAWN AEROBIC AND ANAEROBIC Blood Culture results may not be optimal due to an inadequate volume of blood received in culture bottles   Culture   Final    NO GROWTH < 12 HOURS Performed at [facility], [address], [place], [place] [address]    Report Status PENDING  Incomplete  SARS Coronavirus 2 by RT PCR (hospital order, performed in [place] hospital lab) *cepheid single result test* Anterior Nasal Swab      Status: None   Collection Time: 11/06/21  6:45 PM   Specimen: Anterior Nasal Swab  Result Value Ref Range Status   SARS Coronavirus 2 by RT PCR NEGATIVE NEGATIVE Final    Comment: (NOTE) SARS-CoV-2 target nucleic acids are NOT DETECTED.  The SARS-CoV-2 RNA is generally detectable in upper and lower respiratory specimens during the acute phase of infection. The lowest concentration of SARS-CoV-2 viral copies this assay can detect is 250 copies / mL. A negative result does not preclude SARS-CoV-2 infection and should not be used as the sole basis for treatment or other patient management decisions.  A negative result may occur with improper specimen collection / handling, submission of specimen other than nasopharyngeal swab, presence of viral mutation(s) within the areas targeted by this assay, and inadequate number of viral copies (<250 copies / mL). A negative result must be combined with clinical observations, patient history, and epidemiological information.  Fact Sheet for Patients:   https://www.patel.info/  Fact Sheet for Healthcare Providers: https://hall.com/  This test is not yet approved or  cleared by the [place] FDA and has been authorized for detection and/or diagnosis of SARS-CoV-2 by FDA under an Emergency Use Authorization (EUA).  This EUA will remain in effect (meaning this test can be used) for the duration of the COVID-19 declaration under Section 564(b)(1) of the Act, 21 U.S.C. section 360bbb-3(b)(1), unless the authorization is terminated or revoked sooner.  Performed at [facility], [address], [place], [place] [address]     Procedures and diagnostic studies:  DG Chest Portable 1 View  Result Date: 11/06/2021 CLINICAL DATA:  Shortness of breath, cough, low-grade fever EXAM: PORTABLE CHEST 1 VIEW COMPARISON:  09/14/2020 FINDINGS: Patchy bilateral lower lobe opacities, left greater than  right, suspicious for multifocal pneumonia. No pleural effusion or pneumothorax. The heart is normal in size. Neck stimulator. IMPRESSION: Patchy bilateral lower lobe opacities, left greater than right, suspicious for multifocal pneumonia. Electronically Signed   By: Charline Bills M.D.   On: 11/06/2021 19:14               LOS: 1 day   Quana Chamberlain  [facility]   Pager on [email or web address]. If 7PM-7AM, please contact night-coverage at [email or web address]     11/07/2021, 2:59 PM

## 2021-11-07 NOTE — ED Notes (Signed)
Speech therapy at bedside for assessment.

## 2021-11-07 NOTE — ED Notes (Signed)
Pt brief soiled and pt had BM. Pt cleaned and linens changed. New brief in place.

## 2021-11-07 NOTE — Evaluation (Signed)
Clinical/Bedside Swallow Evaluation Patient Details  Name: Noah Marshall MRN: [id number] Date of Birth: Feb 24, 1995  Today's Date: 11/07/2021 Time: SLP Start Time (ACUTE ONLY): 0840 SLP Stop Time (ACUTE ONLY): 0935 SLP Time Calculation (min) (ACUTE ONLY): 55 min  Past Medical History:  Past Medical History:  Diagnosis Date   Aspiration pneumonia (HCC)    Autism    Epilepsy (HCC)    Epilepsy with status epilepticus (HCC)    Esophageal stenosis    Intracranial arachnoid cysts    Pes planovalgus    Pneumonia 09/26/2015   Premature ventricular contractions    Respiratory failure (HCC)    Past Surgical History:  Past Surgical History:  Procedure Laterality Date   ESOPHAGOGASTRODUODENOSCOPY (EGD) WITH PROPOFOL N/A 09/14/2020   Procedure: ESOPHAGOGASTRODUODENOSCOPY (EGD) WITH PROPOFOL;  Surgeon: Wyline Mood, MD;  Location: [facility] ENDOSCOPY;  Service: Gastroenterology;  Laterality: N/A;   ESOPHAGOGASTRODUODENOSCOPY (EGD) WITH PROPOFOL N/A 10/11/2020   Procedure: ESOPHAGOGASTRODUODENOSCOPY (EGD) WITH PROPOFOL;  Surgeon: Wyline Mood, MD;  Location: [facility] ENDOSCOPY;  Service: Gastroenterology;  Laterality: N/A;  with dilation per Dr. Tobi Bastos   ESOPHAGOGASTRODUODENOSCOPY (EGD) WITH PROPOFOL N/A 11/06/2020   Procedure: ESOPHAGOGASTRODUODENOSCOPY (EGD) WITH PROPOFOL;  Surgeon: Wyline Mood, MD;  Location: [facility] ENDOSCOPY;  Service: Gastroenterology;  Laterality: N/A;   FOREIGN BODY REMOVAL N/A 03/08/2015   Procedure: FOREIGN BODY REMOVAL;  Surgeon: Elnita Maxwell, MD;  Location: [facility] ENDOSCOPY;  Service: Endoscopy;  Laterality: N/A;   IMPLANTATION VAGAL NERVE STIMULATOR  2011   REPLACE / REVISE VAGAL NERVE STIMULATOR     HPI:  Noah Marshall  is a 26 y.o. male w/ medical history significant for Autism - nonverbal, Dysphagia on a dysphagia diet, epilepsy, CP, and esophageal stenosis here with cough, low grade fever. Noah Marshall has a h/o dsyphagia w/ aspiration events, eats pureed foods and is on thickened liquids; family  feeds him. He is dependent for ADLs. He also has a h/o esophageal strictures and has required dilatation in the past. Today, Noah Marshall had an episode of coughing much more after eating with shortness of breath. He also became tahcycardic and agitated. Now presents for evaluation. He has had a mild increase in cough recently. Family does not recall any specific increased choking with meals however. No vomiting. No regurgitation of food.   CXR: Patchy bilateral lower lobe opacities, left greater than right,  suspicious for multifocal pneumonia.    Assessment / Plan / Recommendation  Clinical Impression   Noah Marshall seen for BSE today. Mother present. She endorsed he has been eating "about the same" but coughing noted after eating a meal. Currently, he is awake looking around. He engaged in the po tasks appropriately as he was being fed by this Clinician. Nonverbal and requires cues, feeding.  On RA, afebrile. WBC WNL.  Noah Marshall has Baseline h/o oropharyngeal phase Dysphagia per previous swallowing assessments; recent BSE was in 2022. Mother reported chronic Dysphagia secondary to his Baseline Autism; as well as Epilepsy with status epilepticus, intracranial arachnoid cysts. Noah Marshall is nonverbal and does not consistently follow instructions/cues. ANY Cognitive decline can impact overall awareness/engagement and safety during po tasks which increases risk for aspiration, choking. Noah Marshall has been on a Dysphagia level 1(puree) diet w/ both Nectar and Honey consistency liquids. ALSO, Noah Marshall has h/o Esophageal phase Dysmotility and Dilation.  Noah Marshall's risk for aspiration is present but can be reduced when following aspiration precautions, using a dysphagia diet (as at baseline), and when given feeding support. He requires Mod+ tactile/verbal/ visual cues for orientation to bolus presentation during  feeding support.   Noah Marshall consumed trials of Nectar liquids via TSP and Purees w/ No immediate, overt clinical s/s of aspiration noted; no cough, and no  decline in respiratory status during/post trials. A delayed cough noted x1 toward end of all trials -- suspect could be impact of Slower Esophageal phase Motility. Discussed the impact of Esophageal phase Dysmotility on the swallowing w/ Mother and encouraged f/u w/ GI post Discharge. O2 sats remained 98%, HR/RR at baseline.  Oral phase transfer and immediate swallows were noted. Oral phase was c/b decreased bolus awareness, decreased labial closure, lingual smacking behaviors and "scraping" bolus along palate for A-P transfer to swallow. Slight, intermittent anterior leakage occurred x4. Oral phase time was Quick -- Mother reported he can eat/drink Impulsively at home(this can increase risk for choking). Overall, Noah Marshall exhibited adequate bolus management and oral clearing of the boluses fed to him.  OM Exam was cursory/observation, but No unilateral weakness around the mouth noted; generalized oral weakness w/ decreased tone.   D/t Noah Marshall's Baseline oropharyngeal phase Dysphagia, Impulsivity, and risk for aspiration, recommend continue the dysphagia level 1(puree) w/ Nectar consistency liquids as at Home w/ strict monitoring during presentation/TSP sisp for need to use the Honey consistency liquid if needed -- f/u w/ PCP for discussion. Discussed w/ Mother that the Nectar consistency liquids(TSP) can aid in maintaining Hydration a little better for many pts. Discussed that swallowing presentation can change throughout the day d/t fatigue, and if Noah Marshall is even distracted, these issues can impact his focus during oral intake/swallowing and it could impact need for a certain consistency of liquid at that moment: Nectar vs Honey consistency liquid for safer swallowing. Discussed Feeding strategy of Spoon feeding w/ Nectar liquids to aid safer swallowing d/t small bolus size d/t Noah Marshall's Impulsivity w/ oral intake. Mother agreed.  Recommend aspiration precautions; reduce Distractions during meals and only give po's when Noah Marshall is  attentive and engaged. Check for oral clearing post bites/meals. Pills Crushed in Puree for safer swallowing. Support w/ feeding at meals -- monitor State. NSG updated. Precautions given to Mother. Thickened liquids given also. ST services can be reconsulted if any new issues noted while admitted.  Recommend f/u w/ GI for management. Time spent reviewing Aspiration precautions, diet consistency, and ordering of thickened liquids w/ Mother present.  SLP Visit Diagnosis: Dysphagia, oropharyngeal phase (R13.12) (baseline Dysphagia; Autism and intellectual decline; dependent feeder)    Aspiration Risk  Moderate aspiration risk;Risk for inadequate nutrition/hydration    Diet Recommendation   dysphagia level 1(puree) w/ Nectar consistency liquids via TSP; spiration precautions; reduce Distractions during meals and only give po's when Noah Marshall is attentive and engaged. Check for oral clearing post bites/meals. Feeding support. Medication Administration: Crushed with puree    Other  Recommendations Recommended Consults:  (Dietician f/u) Oral Care Recommendations: Oral care BID;Oral care before and after PO;Staff/trained caregiver to provide oral care Other Recommendations: Order thickener from pharmacy;Prohibited food (jello, ice cream, thin soups);Remove water pitcher;Have oral suction available    Recommendations for follow up therapy are one component of a multi-disciplinary discharge planning process, led by the attending physician.  Recommendations may be updated based on patient status, additional functional criteria and insurance authorization.  Follow up Recommendations Follow physician's recommendations for discharge plan and follow up therapies; f/u w/ GI for Esophageal phase Dysmotiltiy.      Assistance Recommended at Discharge Frequent or constant Supervision/Assistance (dependent feeder)  Functional Status Assessment Patient has had a recent decline in their functional status and/or  demonstrates  limited ability to make significant improvements in function in a reasonable and predictable amount of time  Frequency and Duration  (n/a)   (n/a)       Prognosis Prognosis for Safe Diet Advancement: Guarded (-Fair) Barriers to Reach Goals: Cognitive deficits;Language deficits;Time post onset;Severity of deficits Barriers/Prognosis Comment: baseline Dysphagia; Autism and intellectual decline; dependent feeder      Swallow Study   General Date of Onset: 11/06/21 HPI: Noah Marshall  is a 26 y.o. male w/ medical history significant for Autism - nonverbal, Dysphagia on a dysphagia diet, epilepsy, CP, and esophageal stenosis here with cough, low grade fever. Noah Marshall has a h/o dsyphagia w/ aspiration events, eats pureed foods and is on thickened liquids; family feeds him. He his dependent for ADLs. He also has a h/o esophageal strictures and has required dilatation in the past. Today, Noah Marshall had an episode of coughing much more after eating with shortness of breath. He also became tahcycardic and agitated. Now presents for evaluation. He has had a mild increase in cough recently. Family does not recall any specific increased choking with meals however. No vomiting. No regurgitation of food.   CXR: Patchy bilateral lower lobe opacities, left greater than right,  suspicious for multifocal pneumonia. Type of Study: Bedside Swallow Evaluation Previous Swallow Assessment: 03/2020 Diet Prior to this Study: Dysphagia 1 (puree);Nectar-thick liquids Temperature Spikes Noted: No (wbc 9.9) Respiratory Status: Room air History of Recent Intubation: No Behavior/Cognition: Alert;Cooperative;Pleasant mood;Requires cueing;Doesn't follow directions (nonverbal) Oral Cavity Assessment: Excessive secretions (min drooling) Oral Care Completed by SLP: Yes Oral Cavity - Dentition: Adequate natural dentition Vision:  (n/a) Self-Feeding Abilities: Total assist (UEs contracted) Patient Positioning: Upright in bed;Postural control adequate  for testing (head forward) Baseline Vocal Quality:  (few phonations only) Volitional Cough: Cognitively unable to elicit Volitional Swallow: Unable to elicit    Oral/Motor/Sensory Function Overall Oral Motor/Sensory Function: Generalized oral weakness (no unilateral weakness noted; decreased tone overall)   Ice Chips Ice chips: Impaired Presentation: Spoon (fed; 2) Oral Phase Impairments: Poor awareness of bolus Oral Phase Functional Implications:  (decreased oral attention and bolus manipulation) Pharyngeal Phase Impairments:  (none)   Thin Liquid Thin Liquid: Not tested    Nectar Thick Nectar Thick Liquid: Impaired Presentation: Spoon (12++) Oral Phase Impairments: Reduced labial seal;Reduced lingual movement/coordination;Poor awareness of bolus Oral phase functional implications:  (anterior leakage intermittently) Pharyngeal Phase Impairments: Throat Clearing - Delayed (x1) Other Comments: no changes in ANS   Honey Thick Honey Thick Liquid: Not tested   Puree Puree: Impaired Presentation: Spoon (fed; 10++) Oral Phase Impairments: Poor awareness of bolus;Reduced lingual movement/coordination;Reduced labial seal Oral Phase Functional Implications: Oral residue (diffuse, min) Pharyngeal Phase Impairments:  (none)   Solid     Solid: Not tested        Jerilynn Som, MS, CCC-SLP Speech Language Pathologist Rehab Services; [facility] [phone] (ascom) Elain Wixon 11/07/2021,3:06 PM

## 2021-11-07 NOTE — ED Notes (Signed)
Pt cleansed of urine and stool, sheets changed.

## 2021-11-07 NOTE — ED Notes (Signed)
This RN introduced self to pt and family. Pt purewick in place and brief dry. Family informed waiting for speech evaluation prior to PO med administration.

## 2021-11-08 ENCOUNTER — Other Ambulatory Visit: Payer: Self-pay

## 2021-11-08 LAB — HIV ANTIBODY (ROUTINE TESTING W REFLEX): HIV Screen 4th Generation wRfx: NONREACTIVE

## 2021-11-08 LAB — BASIC METABOLIC PANEL
Anion gap: 8 (ref 5–15)
BUN: 19 mg/dL (ref 6–20)
CO2: 22 mmol/L (ref 22–32)
Calcium: 8.7 mg/dL — ABNORMAL LOW (ref 8.9–10.3)
Chloride: 106 mmol/L (ref 98–111)
Creatinine, Ser: 0.35 mg/dL — ABNORMAL LOW (ref 0.61–1.24)
GFR, Estimated: >60 mL/min (ref >60)
Glucose, Bld: 80 mg/dL (ref 70–99)
Potassium: 4.3 mmol/L (ref 3.5–5.1)
Sodium: 136 mmol/L (ref 135–145)

## 2021-11-08 LAB — URINE CULTURE: Culture: <10000 — AB

## 2021-11-08 MED ORDER — TOPIRAMATE 100 MG PO TABS: 100.0000 mg | ORAL_TABLET | Freq: Two times a day (BID) | ORAL | Status: DC

## 2021-11-08 MED ORDER — GUANFACINE HCL 1 MG PO TABS: 1.0000 mg | ORAL_TABLET | Freq: Two times a day (BID) | ORAL | Status: AC

## 2021-11-08 MED ORDER — AMOXICILLIN-POT CLAVULANATE 875-125 MG PO TABS
1.0000 | ORAL_TABLET | Freq: Two times a day (BID) | ORAL | 0 refills | Status: AC
  Filled 2021-11-08: qty 8, 4d supply, fill #0

## 2021-11-08 NOTE — ED Notes (Signed)
Pt cleansed of incontinent urine, repositioned in bed.

## 2021-11-08 NOTE — ED Notes (Signed)
Lab notified of need for venipuncture assist.  

## 2021-11-08 NOTE — ED Notes (Signed)
Pt per mother back to baseline. Pt awake with no needs.

## 2021-11-08 NOTE — Discharge Summary (Addendum)
Physician Discharge Summary   Patient: Noah Marshall MRN: [id number] DOB: August 10, 1995  Admit date:     11/06/2021  Discharge date: 11/08/21  Discharge Physician: Jennye Boroughs   PCP: Birdie Sons, MD   Recommendations at discharge:   Follow-up with PCP in 1 to 2 weeks  Discharge Diagnoses: Active Problems:   Aspiration pneumonia (HCC)   Sepsis due to pneumonia ([place])   Hyponatremia   Epilepsy ([place])   GERD without esophagitis   Hypokalemia  Resolved Problems:   * No resolved hospital problems. *  Hospital Course:  Noah Marshall is a 26 y.o. male with medical history significant for aspiration pneumonia, autism, epilepsy, esophageal stenosis requiring esophageal dilation in the past, history of respiratory failure, intracranial arachnoid cyst, pes planovalgus, PVCs, was brought to the emergency department because of cough, low-grade fever and significant shortness of breath that occurred after eating.  Reportedly, he was tachycardic and agitated.   He was admitted to the hospital for sepsis secondary to multifocal pneumonia, suspected to be aspiration pneumonia.  He was also hyponatremic.  He was treated with IV fluids and empiric IV antibiotics.  He also had hypokalemia that was repleted.  Hyponatremia has resolved with IV fluids.  His condition has improved and he is back to baseline.  He is deemed stable for discharge to home today.  Discharge plan was discussed with his mother at the bedside.       Consultants: None Procedures performed: None  Disposition: Home Diet recommendation:  Discharge Diet Orders (From admission, onward)     Start     Ordered   11/08/21 0000  DIET - DYS 1       Question:  Fluid consistency:  Answer:  Nectar Thick   11/08/21 0855           Dysphagia type 1 nectar thick Liquid DISCHARGE MEDICATION: Allergies as of 11/08/2021       Reactions   Other Diarrhea, Hives, Itching   Dairy   Peanut-containing Drug Products Hives,  Anaphylaxis   Lactose Intolerance (gi) Diarrhea        Medication List     STOP taking these medications    omeprazole 40 MG capsule Commonly known as: PRILOSEC       TAKE these medications    amoxicillin-clavulanate 875-125 MG tablet Commonly known as: AUGMENTIN Take 1 tablet by mouth 2 (two) times daily for 4 days.   cetirizine HCl 1 MG/ML solution Commonly known as: ZYRTEC Take 15 mg by mouth daily.   cloBAZam 2.5 MG/ML solution Commonly known as: ONFI SHAKE WELL AND GIVE 6 ML BY MOUTH TWICE DAILY. Strength: 2.5 mg/mL What changed: Another medication with the same name was removed. Continue taking this medication, and follow the directions you see here.   clonazePAM 0.5 MG tablet Commonly known as: KLONOPIN Take 1 tablet (0.5 mg total) by mouth 3 (three) times daily What changed: Another medication with the same name was removed. Continue taking this medication, and follow the directions you see here.   fluticasone 50 MCG/ACT nasal spray Commonly known as: FLONASE Place 2 sprays into both nostrils daily.   guanFACINE 1 MG tablet Commonly known as: TENEX Take 1 tablet (1 mg total) by mouth 2 (two) times daily. What changed:  how much to take how to take this when to take this Another medication with the same name was removed. Continue taking this medication, and follow the directions you see here.   levOCARNitine 1 GM/10ML  solution Commonly known as: CARNITOR Take 5 mLs (500 mg total) by mouth 2 (two) times daily What changed: Another medication with the same name was removed. Continue taking this medication, and follow the directions you see here.   montelukast 10 MG tablet Commonly known as: SINGULAIR Take 10 mg by mouth at bedtime.   topiramate 100 MG tablet Commonly known as: TOPAMAX Take 1-2 tablets (100-200 mg total) by mouth 2 (two) times daily. What changed:  how much to take how to take this when to take this Another medication with the  same name was removed. Continue taking this medication, and follow the directions you see here.   Trileptal 300 MG/5ML suspension Generic drug: OXcarbazepine TAKE 10 MLS BY MOUTH 2 TIMES DAILY Strength: 300 mg/5 mL (60 mg/mL) What changed: Another medication with the same name was removed. Continue taking this medication, and follow the directions you see here.   valproic acid 250 MG/5ML solution Commonly known as: DEPAKENE Take 10 mLs (500 mg total) by mouth 2 (two) times daily What changed: Another medication with the same name was removed. Continue taking this medication, and follow the directions you see here.        Discharge Exam: Filed Weights   11/06/21 2239  Weight: 52.2 kg   GEN: NAD SKIN: Warm and dry EYES: No pallor or icterus ENT: MMM CV: RRR PULM: CTA B ABD: soft, ND, NT, +BS CNS: Alert but nonverbal, non focal EXT: No edema or tenderness   Condition at discharge: good  The results of significant diagnostics from this hospitalization (including imaging, microbiology, ancillary and laboratory) are listed below for reference.   Imaging Studies: DG Chest Portable 1 View  Result Date: 11/06/2021 CLINICAL DATA:  Shortness of breath, cough, low-grade fever EXAM: PORTABLE CHEST 1 VIEW COMPARISON:  09/14/2020 FINDINGS: Patchy bilateral lower lobe opacities, left greater than right, suspicious for multifocal pneumonia. No pleural effusion or pneumothorax. The heart is normal in size. Neck stimulator. IMPRESSION: Patchy bilateral lower lobe opacities, left greater than right, suspicious for multifocal pneumonia. Electronically Signed   By: Julian Hy M.D.   On: 11/06/2021 19:14    Microbiology: Results for orders placed or performed during the hospital encounter of 11/06/21  Blood culture (routine x 2)     Status: None (Preliminary result)   Collection Time: 11/06/21  6:35 PM   Specimen: BLOOD  Result Value Ref Range Status   Specimen Description BLOOD BLOOD  RIGHT FOREARM  Final   Special Requests   Final    BOTTLES DRAWN AEROBIC AND ANAEROBIC Blood Culture results may not be optimal due to an inadequate volume of blood received in culture bottles   Culture   Final    NO GROWTH < 12 HOURS Performed at [facility], [address] [place], [place] [address]    Report Status PENDING  Incomplete  Blood culture (routine x 2)     Status: None (Preliminary result)   Collection Time: 11/06/21  6:40 PM   Specimen: BLOOD  Result Value Ref Range Status   Specimen Description BLOOD BLOOD LEFT ARM  Final   Special Requests   Final    BOTTLES DRAWN AEROBIC AND ANAEROBIC Blood Culture results may not be optimal due to an inadequate volume of blood received in culture bottles   Culture   Final    NO GROWTH < 12 HOURS Performed at [facility], [address] [place],  [address]    Report Status PENDING  Incomplete  SARS Coronavirus 2 by RT PCR (hospital order, performed in [facility] hospital lab) *cepheid single result test* Anterior Nasal Swab     Status: None   Collection Time: 11/06/21  6:45 PM   Specimen: Anterior Nasal Swab  Result Value Ref Range Status   SARS Coronavirus 2 by RT PCR NEGATIVE NEGATIVE Final    Comment: (NOTE) SARS-CoV-2 target nucleic acids are NOT DETECTED.  The SARS-CoV-2 RNA is generally detectable in upper and lower respiratory specimens during the acute phase of infection. The lowest concentration of SARS-CoV-2 viral copies this assay can detect is 250 copies / mL. A negative result does not preclude SARS-CoV-2 infection and should not be used as the sole basis for treatment or other patient management decisions.  A negative result may occur with improper specimen collection / handling, submission of specimen other than nasopharyngeal swab, presence of viral mutation(s) within the areas targeted by this assay, and inadequate number of viral copies (<250 copies / mL). A negative result must  be combined with clinical observations, patient history, and epidemiological information.  Fact Sheet for Patients:   https://www.patel.info/  Fact Sheet for Healthcare Providers: https://hall.com/  This test is not yet approved or  cleared by the [place] FDA and has been authorized for detection and/or diagnosis of SARS-CoV-2 by FDA under an Emergency Use Authorization (EUA).  This EUA will remain in effect (meaning this test can be used) for the duration of the COVID-19 declaration under Section 564(b)(1) of the Act, 21 U.S.C. section 360bbb-3(b)(1), unless the authorization is terminated or revoked sooner.  Performed at [facility], [address] [place], [place] [address]   Urine Culture     Status: Abnormal   Collection Time: 11/06/21  7:59 PM   Specimen: Urine, Random  Result Value Ref Range Status   Specimen Description   Final    URINE, RANDOM Performed at [facility], [address], [place], [place] [address]    Special Requests   Final    NONE Performed at [facility], [address] [place], [place] [address]    Culture (A)  Final    <10,000 COLONIES/mL INSIGNIFICANT GROWTH Performed at [facility], [place] [address], [place],  [address]    Report Status 11/08/2021 FINAL  Final    Labs: CBC: Recent Labs  Lab 11/06/21 1845 11/07/21 0625  WBC 8.8 9.9  NEUTROABS 6.9  --   HGB 12.9* 10.0*  HCT 38.1* 28.4*  MCV 88.8 85.8  PLT 136* [id number]*   Basic Metabolic Panel: Recent Labs  Lab 11/06/21 1845 11/07/21 0624 11/07/21 0625 11/08/21 0643  NA 123*  --  129* 136  K 4.8  --  3.3* 4.3  CL 90*  --  97* 106  CO2 21*  --  24 22  GLUCOSE 89  --  92 80  BUN 14  --  8 19  CREATININE 0.43*  --  <0.30* 0.35*  CALCIUM 9.1  --  8.3* 8.7*  MG  --  1.8  --   --    Liver Function Tests: Recent Labs  Lab 11/06/21 1845  AST 20  ALT 8  ALKPHOS 61  BILITOT 0.6  PROT  8.0  ALBUMIN 3.9   CBG: No results for input(s): "GLUCAP" in the last 168 hours.  Discharge time spent: greater than 30 minutes.  Signed: Jennye Boroughs, MD [facility] 11/08/2021

## 2021-11-08 NOTE — ED Notes (Signed)
Pt is dry.  

## 2021-11-08 NOTE — ED Notes (Signed)
Lab here for venipuncture.  

## 2021-11-08 NOTE — ED Notes (Signed)
Report to erika, rn and hannah, rn.  

## 2021-11-09 ENCOUNTER — Telehealth: Payer: Self-pay

## 2021-11-09 NOTE — Telephone Encounter (Signed)
Transition Care Management Unsuccessful Follow-up Telephone Call  Date of discharge and from where:  [place] 11/08/2021  Attempts:  1st Attempt  Reason for unsuccessful TCM follow-up call:  Left voice message

## 2021-11-11 LAB — CULTURE, BLOOD (ROUTINE X 2)
Culture: NO GROWTH
Culture: NO GROWTH

## 2021-11-13 NOTE — Telephone Encounter (Signed)
Transition Care Management Follow-up Telephone Call Date of discharge and from where: [facility] 11/08/2021 How have you been since you were released from the hospital? better Any questions or concerns? No  Items Reviewed: Did the pt receive and understand the discharge instructions provided? Yes  Medications obtained and verified? Yes  Other? No  Any new allergies since your discharge? No  Dietary orders reviewed? Yes Do you have support at home? Yes   Home Care and Equipment/Supplies: Were home health services ordered? no If so, what is the name of the agency?n/a Has the agency set up a time to come to the patient's home? not applicable Were any new equipment or medical supplies ordered?  No What is the name of the medical supply agency? N/a Were you able to get the supplies/equipment? not applicable Do you have any questions related to the use of the equipment or supplies? No  Functional Questionnaire: (I = Independent and D = Dependent) ADLs: D  Bathing/Dressing- D  Meal Prep- D  Eating- D  Maintaining continence- D  Transferring/Ambulation- D  Managing Meds- D  Follow up appointments reviewed:  PCP Hospital f/u appt confirmed? Yes  Scheduled to see Dr Caryn Section on 11/23/2021 @ 2:00. [facility] f/u appt confirmed? No   Are transportation arrangements needed? Yes  If their condition worsens, is the pt aware to call PCP or go to the Emergency Dept.? Yes Was the patient provided with contact information for the PCP's office or ED? Yes Was to pt encouraged to call back with questions or concerns? Yes  Juanda Crumble, LPN [place] Direct Dial [phone]

## 2021-11-23 ENCOUNTER — Encounter: Payer: Self-pay | Admitting: Family Medicine

## 2021-11-23 ENCOUNTER — Ambulatory Visit
Admission: RE | Admit: 2021-11-23 | Discharge: 2021-11-23 | Disposition: A | Discharge status: Home / Self Care | Payer: 59 | Source: Ambulatory Visit | Attending: Family Medicine | Admitting: Family Medicine

## 2021-11-23 ENCOUNTER — Ambulatory Visit (INDEPENDENT_AMBULATORY_CARE_PROVIDER_SITE_OTHER): Payer: 59 | Admitting: Family Medicine

## 2021-11-23 ENCOUNTER — Ambulatory Visit
Admission: RE | Admit: 2021-11-23 | Discharge: 2021-11-23 | Disposition: A | Discharge status: Home / Self Care | Payer: 59 | Attending: Family Medicine | Admitting: Family Medicine

## 2021-11-23 VITALS — BP 107/63 | HR 64 | Temp 98.1°F | Resp 12 | Wt 110.0 lb

## 2021-11-23 DIAGNOSIS — J189 Pneumonia, unspecified organism: Secondary | ICD-10-CM

## 2021-11-23 DIAGNOSIS — Z23 Encounter for immunization: Secondary | ICD-10-CM | POA: Diagnosis not present

## 2021-11-23 NOTE — Progress Notes (Signed)
I,Roshena L Chambers,acting as a scribe for Lelon Huh, MD.,have documented all relevant documentation on the behalf of Lelon Huh, MD,as directed by  Lelon Huh, MD while in the presence of Lelon Huh, MD.   Established patient visit   Patient: Noah Marshall   DOB: 1995/08/26   26 y.o. Male  MRN: [id number] Visit Date: 11/23/2021  Today's healthcare provider: Lelon Huh, MD   Chief Complaint  Patient presents with   Hospitalization Follow-up   Subjective    HPI  Follow up Hospitalization  Patient was admitted to [facility] ER on 11/06/2021 and discharged on 11/08/2021. He was treated for aspiration pneumonia. Treatment for this included IV antibiotics and fluids. He was discharged on Augmentin.   Telephone follow up was done on 11/09/2021. He reports good compliance with treatment. He reports this condition is improved. However he does still continue to have mild intermittent cough.   ----------------------------------------------------------------------------------------- -   Medications: Outpatient Medications Prior to Visit  Medication Sig   cetirizine HCl (ZYRTEC) 1 MG/ML solution Take 15 mg by mouth daily.   cloBAZam (ONFI) 2.5 MG/ML solution SHAKE WELL AND GIVE 6 ML BY MOUTH TWICE DAILY. Strength: 2.5 mg/mL   clonazePAM (KLONOPIN) 0.5 MG tablet Take 1 tablet (0.5 mg total) by mouth 3 (three) times daily   fluticasone (FLONASE) 50 MCG/ACT nasal spray Place 2 sprays into both nostrils daily.   guanFACINE (TENEX) 1 MG tablet Take 1 tablet (1 mg total) by mouth 2 (two) times daily.   levOCARNitine (CARNITOR) 1 GM/10ML solution Take 5 mLs (500 mg total) by mouth 2 (two) times daily   montelukast (SINGULAIR) 10 MG tablet Take 10 mg by mouth at bedtime.   topiramate (TOPAMAX) 100 MG tablet Take 1-2 tablets (100-200 mg total) by mouth 2 (two) times daily.   TRILEPTAL 300 MG/5ML suspension TAKE 10 MLS BY MOUTH 2 TIMES DAILY Strength: 300 mg/5 mL (60 mg/mL)    valproic acid (DEPAKENE) 250 MG/5ML solution Take 10 mLs (500 mg total) by mouth 2 (two) times daily   No facility-administered medications prior to visit.    Review of Systems  Constitutional:  Negative for appetite change, chills and fever.  Respiratory:  Positive for cough. Negative for chest tightness, shortness of breath and wheezing.   Cardiovascular:  Negative for chest pain and palpitations.  Gastrointestinal:  Negative for abdominal pain, nausea and vomiting.       Objective    BP 107/63 (BP Location: Left Arm, Patient Position: Sitting, Cuff Size: Normal)   Pulse 64   Resp 12   Wt 110 lb (49.9 kg)   SpO2 100% Comment: room air  BMI 17.75 kg/m    Physical Exam   General: Appearance:    Thin male in no acute distress  Eyes:    PERRL, conjunctiva/corneas clear, EOM's intact       Lungs:     Rare expiratory wheeze, respirations unlabored  Heart:    Normal heart rate. Normal rhythm. No murmurs, rubs, or gallops.    MS:   All extremities are intact.    Neurologic:   Awake, alert, oriented x 3. No apparent focal neurological defect.        Assessment & Plan     1. Pneumonia of both lungs due to infectious organism, unspecified part of lung Greatly improved since hospitalization. Still occasional expiratory wheeze. Has completed antibiotic.  - DG Chest 2 View; Future  2. Need for immunization against influenza  - Flu Vaccine  QUAD 4mo+IM (Fluarix, Fluzone & Alfiuria Quad PF)      The entirety of the information documented in the History of Present Illness, Review of Systems and Physical Exam were personally obtained by me. Portions of this information were initially documented by the CMA and reviewed by me for thoroughness and accuracy.     Lelon Huh, MD  [facility] [phone] (phone) [phone] (fax)  [place]

## 2021-11-27 ENCOUNTER — Other Ambulatory Visit: Payer: Self-pay

## 2021-11-29 ENCOUNTER — Other Ambulatory Visit: Payer: Self-pay

## 2021-12-03 ENCOUNTER — Other Ambulatory Visit: Payer: Self-pay

## 2021-12-03 DIAGNOSIS — J189 Pneumonia, unspecified organism: Secondary | ICD-10-CM

## 2021-12-04 ENCOUNTER — Other Ambulatory Visit: Payer: Self-pay

## 2021-12-11 ENCOUNTER — Other Ambulatory Visit: Payer: Self-pay

## 2021-12-11 MED ORDER — TOPIRAMATE 200 MG PO TABS
200.0000 mg | ORAL_TABLET | Freq: Two times a day (BID) | ORAL | 3 refills | Status: AC
  Filled 2021-12-11: qty 180, 90d supply, fill #0
  Filled 2022-03-05: qty 180, 90d supply, fill #1
  Filled 2022-05-30: qty 180, 90d supply, fill #2
  Filled 2022-09-10: qty 180, 90d supply, fill #3

## 2021-12-12 ENCOUNTER — Other Ambulatory Visit: Payer: Self-pay

## 2021-12-17 ENCOUNTER — Other Ambulatory Visit: Payer: Self-pay

## 2021-12-17 ENCOUNTER — Telehealth: Payer: Self-pay | Admitting: Family Medicine

## 2021-12-17 DIAGNOSIS — J690 Pneumonitis due to inhalation of food and vomit: Secondary | ICD-10-CM

## 2021-12-17 NOTE — Telephone Encounter (Signed)
Please advise patient's parents it is time to recheck chest xray to make sure pneumonia has cleared. Order has been placed.

## 2021-12-17 NOTE — Telephone Encounter (Signed)
Patients mother advised  

## 2021-12-19 ENCOUNTER — Ambulatory Visit
Admission: RE | Admit: 2021-12-19 | Discharge: 2021-12-19 | Disposition: A | Discharge status: Home / Self Care | Payer: 59 | Attending: Family Medicine | Admitting: Family Medicine

## 2021-12-19 ENCOUNTER — Ambulatory Visit
Admission: RE | Admit: 2021-12-19 | Discharge: 2021-12-19 | Disposition: A | Discharge status: Home / Self Care | Payer: 59 | Source: Ambulatory Visit | Attending: Family Medicine | Admitting: Family Medicine

## 2021-12-19 DIAGNOSIS — J189 Pneumonia, unspecified organism: Secondary | ICD-10-CM | POA: Diagnosis not present

## 2021-12-19 DIAGNOSIS — J690 Pneumonitis due to inhalation of food and vomit: Secondary | ICD-10-CM | POA: Insufficient documentation

## 2021-12-27 ENCOUNTER — Other Ambulatory Visit: Payer: Self-pay

## 2022-01-01 ENCOUNTER — Other Ambulatory Visit: Payer: Self-pay

## 2022-01-04 ENCOUNTER — Other Ambulatory Visit: Payer: Self-pay

## 2022-01-22 ENCOUNTER — Other Ambulatory Visit: Payer: Self-pay

## 2022-02-06 ENCOUNTER — Telehealth: Payer: Self-pay | Admitting: Gastroenterology

## 2022-02-06 ENCOUNTER — Other Ambulatory Visit: Payer: Self-pay

## 2022-02-06 NOTE — Telephone Encounter (Signed)
PT medical records operative notes for 09/14/2020 to 11/06/2020 [facility] [id number] [place] [id number] on 02/06/2022

## 2022-02-07 ENCOUNTER — Other Ambulatory Visit: Payer: Self-pay

## 2022-02-07 MED ORDER — CLOBAZAM 2.5 MG/ML PO SUSP
ORAL | 5 refills | Status: DC
  Filled 2022-02-07: qty 360, 30d supply, fill #0
  Filled 2022-03-11: qty 360, 30d supply, fill #1
  Filled 2022-04-08: qty 360, 30d supply, fill #2
  Filled 2022-05-06: qty 360, 30d supply, fill #3
  Filled 2022-06-17: qty 360, 30d supply, fill #4

## 2022-02-07 MED ORDER — CLONAZEPAM 0.5 MG PO TABS
ORAL_TABLET | ORAL | 5 refills | Status: DC
  Filled 2022-02-07: qty 90, 30d supply, fill #0
  Filled 2022-03-25: qty 90, 30d supply, fill #1
  Filled 2022-04-22: qty 90, 30d supply, fill #2
  Filled 2022-05-30: qty 90, 30d supply, fill #3
  Filled 2022-07-08: qty 90, 30d supply, fill #4

## 2022-02-18 ENCOUNTER — Other Ambulatory Visit: Payer: Self-pay

## 2022-02-26 ENCOUNTER — Other Ambulatory Visit: Payer: Self-pay

## 2022-02-27 ENCOUNTER — Telehealth: Payer: Self-pay

## 2022-02-27 NOTE — Telephone Encounter (Signed)
We receive a request from the Disability Determination services for records for patient. Printed records and faxed to [phone]

## 2022-03-04 ENCOUNTER — Other Ambulatory Visit: Payer: Self-pay

## 2022-03-05 ENCOUNTER — Other Ambulatory Visit: Payer: Self-pay

## 2022-03-06 ENCOUNTER — Other Ambulatory Visit: Payer: Self-pay

## 2022-03-08 ENCOUNTER — Other Ambulatory Visit: Payer: Self-pay

## 2022-03-11 ENCOUNTER — Other Ambulatory Visit: Payer: Self-pay

## 2022-03-13 ENCOUNTER — Other Ambulatory Visit: Payer: Self-pay

## 2022-03-25 ENCOUNTER — Other Ambulatory Visit: Payer: Self-pay

## 2022-04-01 ENCOUNTER — Other Ambulatory Visit: Payer: Self-pay

## 2022-04-02 ENCOUNTER — Other Ambulatory Visit: Payer: Self-pay

## 2022-04-08 ENCOUNTER — Other Ambulatory Visit: Payer: Self-pay

## 2022-04-22 ENCOUNTER — Other Ambulatory Visit: Payer: Self-pay

## 2022-05-01 ENCOUNTER — Emergency Department: Payer: Commercial Managed Care - PPO

## 2022-05-01 ENCOUNTER — Inpatient Hospital Stay
Admission: EM | Admit: 2022-05-01 | Discharge: 2022-05-03 | DRG: 871 | Disposition: A | Discharge status: Home / Self Care | Payer: Commercial Managed Care - PPO | Attending: Internal Medicine | Admitting: Internal Medicine

## 2022-05-01 ENCOUNTER — Other Ambulatory Visit: Payer: Self-pay

## 2022-05-01 DIAGNOSIS — D696 Thrombocytopenia, unspecified: Secondary | ICD-10-CM | POA: Diagnosis not present

## 2022-05-01 DIAGNOSIS — R000 Tachycardia, unspecified: Secondary | ICD-10-CM | POA: Diagnosis not present

## 2022-05-01 DIAGNOSIS — E871 Hypo-osmolality and hyponatremia: Secondary | ICD-10-CM | POA: Diagnosis not present

## 2022-05-01 DIAGNOSIS — Z8249 Family history of ischemic heart disease and other diseases of the circulatory system: Secondary | ICD-10-CM | POA: Diagnosis not present

## 2022-05-01 DIAGNOSIS — Z91010 Allergy to peanuts: Secondary | ICD-10-CM | POA: Diagnosis not present

## 2022-05-01 DIAGNOSIS — Z833 Family history of diabetes mellitus: Secondary | ICD-10-CM | POA: Diagnosis not present

## 2022-05-01 DIAGNOSIS — E876 Hypokalemia: Secondary | ICD-10-CM | POA: Diagnosis present

## 2022-05-01 DIAGNOSIS — Z681 Body mass index (BMI) 19 or less, adult: Secondary | ICD-10-CM | POA: Diagnosis not present

## 2022-05-01 DIAGNOSIS — D649 Anemia, unspecified: Secondary | ICD-10-CM | POA: Diagnosis not present

## 2022-05-01 DIAGNOSIS — R0689 Other abnormalities of breathing: Secondary | ICD-10-CM | POA: Diagnosis not present

## 2022-05-01 DIAGNOSIS — G40909 Epilepsy, unspecified, not intractable, without status epilepticus: Secondary | ICD-10-CM

## 2022-05-01 DIAGNOSIS — Z79899 Other long term (current) drug therapy: Secondary | ICD-10-CM | POA: Diagnosis not present

## 2022-05-01 DIAGNOSIS — R6520 Severe sepsis without septic shock: Secondary | ICD-10-CM | POA: Diagnosis present

## 2022-05-01 DIAGNOSIS — G9341 Metabolic encephalopathy: Secondary | ICD-10-CM | POA: Diagnosis not present

## 2022-05-01 DIAGNOSIS — R0902 Hypoxemia: Secondary | ICD-10-CM

## 2022-05-01 DIAGNOSIS — F840 Autistic disorder: Secondary | ICD-10-CM | POA: Diagnosis not present

## 2022-05-01 DIAGNOSIS — A0811 Acute gastroenteropathy due to Norwalk agent: Secondary | ICD-10-CM | POA: Diagnosis present

## 2022-05-01 DIAGNOSIS — R0602 Shortness of breath: Secondary | ICD-10-CM | POA: Diagnosis not present

## 2022-05-01 DIAGNOSIS — E43 Unspecified severe protein-calorie malnutrition: Secondary | ICD-10-CM | POA: Diagnosis present

## 2022-05-01 DIAGNOSIS — R0682 Tachypnea, not elsewhere classified: Secondary | ICD-10-CM | POA: Diagnosis present

## 2022-05-01 DIAGNOSIS — J690 Pneumonitis due to inhalation of food and vomit: Secondary | ICD-10-CM | POA: Diagnosis present

## 2022-05-01 DIAGNOSIS — J939 Pneumothorax, unspecified: Secondary | ICD-10-CM | POA: Diagnosis not present

## 2022-05-01 DIAGNOSIS — I493 Ventricular premature depolarization: Secondary | ICD-10-CM | POA: Diagnosis present

## 2022-05-01 DIAGNOSIS — R197 Diarrhea, unspecified: Secondary | ICD-10-CM | POA: Diagnosis present

## 2022-05-01 DIAGNOSIS — A419 Sepsis, unspecified organism: Principal | ICD-10-CM | POA: Diagnosis present

## 2022-05-01 DIAGNOSIS — Z1152 Encounter for screening for COVID-19: Secondary | ICD-10-CM

## 2022-05-01 DIAGNOSIS — J168 Pneumonia due to other specified infectious organisms: Secondary | ICD-10-CM | POA: Diagnosis not present

## 2022-05-01 DIAGNOSIS — E739 Lactose intolerance, unspecified: Secondary | ICD-10-CM | POA: Diagnosis present

## 2022-05-01 DIAGNOSIS — I959 Hypotension, unspecified: Secondary | ICD-10-CM | POA: Diagnosis not present

## 2022-05-01 DIAGNOSIS — J90 Pleural effusion, not elsewhere classified: Secondary | ICD-10-CM | POA: Diagnosis not present

## 2022-05-01 DIAGNOSIS — J189 Pneumonia, unspecified organism: Principal | ICD-10-CM

## 2022-05-01 LAB — COMPREHENSIVE METABOLIC PANEL
ALT: 8 U/L (ref 0–44)
AST: 17 U/L (ref 15–41)
Albumin: 3.8 g/dL (ref 3.5–5.0)
Alkaline Phosphatase: 76 U/L (ref 38–126)
Anion gap: 8 (ref 5–15)
BUN: 14 mg/dL (ref 6–20)
CO2: 21 mmol/L — ABNORMAL LOW (ref 22–32)
Calcium: 8.5 mg/dL — ABNORMAL LOW (ref 8.9–10.3)
Chloride: 92 mmol/L — ABNORMAL LOW (ref 98–111)
Creatinine, Ser: 0.41 mg/dL — ABNORMAL LOW (ref 0.61–1.24)
GFR, Estimated: >60 mL/min (ref >60)
Glucose, Bld: 97 mg/dL (ref 70–99)
Potassium: 4 mmol/L (ref 3.5–5.1)
Sodium: 121 mmol/L — ABNORMAL LOW (ref 135–145)
Total Bilirubin: 0.6 mg/dL (ref 0.3–1.2)
Total Protein: 7.1 g/dL (ref 6.5–8.1)

## 2022-05-01 LAB — CBC WITH DIFFERENTIAL/PLATELET
Abs Immature Granulocytes: 0.05 10*3/uL (ref 0.00–0.07)
Basophils Absolute: 0.1 10*3/uL (ref 0.0–0.1)
Basophils Relative: 0 %
Eosinophils Absolute: 0 10*3/uL (ref 0.0–0.5)
Eosinophils Relative: 0 %
HCT: 35.9 % — ABNORMAL LOW (ref 39.0–52.0)
Hemoglobin: 12.5 g/dL — ABNORMAL LOW (ref 13.0–17.0)
Immature Granulocytes: 0 %
Lymphocytes Relative: 3 %
Lymphs Abs: 0.4 10*3/uL — ABNORMAL LOW (ref 0.7–4.0)
MCH: 30.7 pg (ref 26.0–34.0)
MCHC: 34.8 g/dL (ref 30.0–36.0)
MCV: 88.2 fL (ref 80.0–100.0)
Monocytes Absolute: 1.2 10*3/uL — ABNORMAL HIGH (ref 0.1–1.0)
Monocytes Relative: 9 %
Neutro Abs: 11.9 10*3/uL — ABNORMAL HIGH (ref 1.7–7.7)
Neutrophils Relative %: 88 %
Platelets: 103 10*3/uL — ABNORMAL LOW (ref 150–400)
RBC: 4.07 MIL/uL — ABNORMAL LOW (ref 4.22–5.81)
RDW: 12.3 % (ref 11.5–15.5)
WBC: 13.7 10*3/uL — ABNORMAL HIGH (ref 4.0–10.5)
nRBC: 0 % (ref 0.0–0.2)

## 2022-05-01 LAB — GASTROINTESTINAL PANEL BY PCR, STOOL (REPLACES STOOL CULTURE)

## 2022-05-01 LAB — C DIFFICILE QUICK SCREEN W PCR REFLEX
C Diff antigen: NEGATIVE
C Diff interpretation: NOT DETECTED
C Diff toxin: NEGATIVE

## 2022-05-01 LAB — BASIC METABOLIC PANEL
Anion gap: 10 (ref 5–15)
BUN: 10 mg/dL (ref 6–20)
CO2: 19 mmol/L — ABNORMAL LOW (ref 22–32)
Calcium: 8.3 mg/dL — ABNORMAL LOW (ref 8.9–10.3)
Chloride: 101 mmol/L (ref 98–111)
Creatinine, Ser: <0.3 mg/dL — ABNORMAL LOW (ref 0.61–1.24)
Glucose, Bld: 96 mg/dL (ref 70–99)
Potassium: 3.8 mmol/L (ref 3.5–5.1)
Sodium: 130 mmol/L — ABNORMAL LOW (ref 135–145)

## 2022-05-01 LAB — LACTIC ACID, PLASMA
Lactic Acid, Venous: 1.1 mmol/L (ref 0.5–1.9)
Lactic Acid, Venous: 2.2 mmol/L (ref 0.5–1.9)
Lactic Acid, Venous: 3 mmol/L (ref 0.5–1.9)
Lactic Acid, Venous: 2.1 mmol/L (ref 0.5–1.9)

## 2022-05-01 LAB — SARS CORONAVIRUS 2 BY RT PCR: SARS Coronavirus 2 by RT PCR: NEGATIVE

## 2022-05-01 LAB — PROTIME-INR
INR: 1.3 — ABNORMAL HIGH (ref 0.8–1.2)
Prothrombin Time: 15.6 seconds — ABNORMAL HIGH (ref 11.4–15.2)

## 2022-05-01 LAB — PROCALCITONIN: Procalcitonin: 0.18 ng/mL

## 2022-05-01 MED ORDER — OXCARBAZEPINE 300 MG/5ML PO SUSP
600.0000 mg | Freq: Two times a day (BID) | ORAL | Status: DC
  Administered 2022-05-01 – 2022-05-03 (×5): 600 mg via ORAL
  Filled 2022-05-01 (×6): qty 10

## 2022-05-01 MED ORDER — CETIRIZINE HCL 5 MG/5ML PO SOLN
15.0000 mg | Freq: Every day | ORAL | Status: DC
  Administered 2022-05-01 – 2022-05-03 (×3): 15 mg via ORAL
  Filled 2022-05-01 (×3): qty 15

## 2022-05-01 MED ORDER — VALPROATE SODIUM 100 MG/ML IV SOLN
500.0000 mg | Freq: Two times a day (BID) | INTRAVENOUS | Status: DC
  Administered 2022-05-01 – 2022-05-03 (×5): 500 mg via INTRAVENOUS
  Filled 2022-05-01 (×6): qty 5

## 2022-05-01 MED ORDER — GUAIFENESIN 100 MG/5ML PO LIQD: 200.0000 mg | ORAL | Status: DC | PRN

## 2022-05-01 MED ORDER — ACETAMINOPHEN 650 MG RE SUPP
650.0000 mg | Freq: Four times a day (QID) | RECTAL | Status: DC | PRN
  Administered 2022-05-01: 650 mg via RECTAL

## 2022-05-01 MED ORDER — SODIUM CHLORIDE 0.9 % IV SOLN
500.0000 mg | Freq: Once | INTRAVENOUS | Status: DC
  Filled 2022-05-01: qty 5

## 2022-05-01 MED ORDER — FLUTICASONE PROPIONATE 50 MCG/ACT NA SUSP
2.0000 | Freq: Every day | NASAL | Status: DC
  Administered 2022-05-03: 2 via NASAL
  Filled 2022-05-01: qty 16

## 2022-05-01 MED ORDER — MONTELUKAST SODIUM 10 MG PO TABS
10.0000 mg | ORAL_TABLET | Freq: Every day | ORAL | Status: DC
  Administered 2022-05-01 – 2022-05-02 (×2): 10 mg via ORAL
  Filled 2022-05-01 (×2): qty 1

## 2022-05-01 MED ORDER — ALBUTEROL SULFATE (2.5 MG/3ML) 0.083% IN NEBU
2.5000 mg | INHALATION_SOLUTION | RESPIRATORY_TRACT | Status: DC | PRN
  Filled 2022-05-01: qty 3

## 2022-05-01 MED ORDER — GUANFACINE HCL 1 MG PO TABS
1.0000 mg | ORAL_TABLET | Freq: Two times a day (BID) | ORAL | Status: DC
  Administered 2022-05-01 – 2022-05-03 (×5): 1 mg via ORAL
  Filled 2022-05-01 (×6): qty 1

## 2022-05-01 MED ORDER — VALPROIC ACID 250 MG/5ML PO SOLN: 500.0000 mg | Freq: Two times a day (BID) | ORAL | Status: DC

## 2022-05-01 MED ORDER — CLONAZEPAM 0.5 MG PO TABS
0.5000 mg | ORAL_TABLET | Freq: Three times a day (TID) | ORAL | Status: DC
  Administered 2022-05-01 – 2022-05-03 (×6): 0.5 mg via ORAL
  Filled 2022-05-01 (×6): qty 1

## 2022-05-01 MED ORDER — ENOXAPARIN SODIUM 40 MG/0.4ML IJ SOSY
40.0000 mg | PREFILLED_SYRINGE | INTRAMUSCULAR | Status: DC
  Administered 2022-05-01 – 2022-05-03 (×3): 40 mg via SUBCUTANEOUS
  Filled 2022-05-01 (×3): qty 0.4

## 2022-05-01 MED ORDER — SODIUM CHLORIDE 0.9 % IV BOLUS: 1500.0000 mL | Freq: Once | INTRAVENOUS | Status: DC

## 2022-05-01 MED ORDER — CLOBAZAM 2.5 MG/ML PO SUSP: 15.0000 mg | Freq: Two times a day (BID) | ORAL | Status: DC

## 2022-05-01 MED ORDER — SODIUM CHLORIDE 0.9 % IV SOLN
1.0000 g | Freq: Once | INTRAVENOUS | Status: DC
  Filled 2022-05-01: qty 10

## 2022-05-01 MED ORDER — LEVOCARNITINE 1 GM/10ML PO SOLN
500.0000 mg | Freq: Two times a day (BID) | ORAL | Status: DC
  Administered 2022-05-01 – 2022-05-03 (×5): 500 mg via ORAL
  Filled 2022-05-01 (×5): qty 5

## 2022-05-01 MED ORDER — SODIUM CHLORIDE 1 G PO TABS
1.0000 g | ORAL_TABLET | Freq: Two times a day (BID) | ORAL | Status: DC
  Administered 2022-05-01 – 2022-05-03 (×4): 1 g via ORAL
  Filled 2022-05-01 (×5): qty 1

## 2022-05-01 MED ORDER — TOPIRAMATE 100 MG PO TABS
200.0000 mg | ORAL_TABLET | Freq: Two times a day (BID) | ORAL | Status: DC
  Administered 2022-05-01 – 2022-05-03 (×5): 200 mg via ORAL
  Filled 2022-05-01 (×6): qty 2

## 2022-05-01 MED ORDER — CLOBAZAM 5 MG PO HALF TABLET: 15.0000 mg | ORAL_TABLET | Freq: Two times a day (BID) | ORAL | Status: DC

## 2022-05-01 MED ORDER — LORAZEPAM 2 MG/ML IJ SOLN: 2.0000 mg | INTRAMUSCULAR | Status: DC | PRN

## 2022-05-01 MED ORDER — ONDANSETRON HCL 4 MG/2ML IJ SOLN: 4.0000 mg | Freq: Three times a day (TID) | INTRAMUSCULAR | Status: DC | PRN

## 2022-05-01 MED ORDER — CLOBAZAM 5 MG PO HALF TABLET
15.0000 mg | ORAL_TABLET | Freq: Two times a day (BID) | ORAL | Status: DC
  Administered 2022-05-01 – 2022-05-03 (×5): 15 mg via ORAL
  Filled 2022-05-01 (×5): qty 1

## 2022-05-01 MED ORDER — ACETAMINOPHEN 325 MG PO TABS: 650.0000 mg | ORAL_TABLET | Freq: Four times a day (QID) | ORAL | Status: DC | PRN

## 2022-05-01 MED ORDER — ACETAMINOPHEN 500 MG PO TABS: 1000.0000 mg | ORAL_TABLET | Freq: Once | ORAL | Status: DC

## 2022-05-01 MED ORDER — SODIUM CHLORIDE 0.9 % IV SOLN
3.0000 g | Freq: Four times a day (QID) | INTRAVENOUS | Status: DC
  Administered 2022-05-01 – 2022-05-03 (×9): 3 g via INTRAVENOUS
  Filled 2022-05-01 (×3): qty 8
  Filled 2022-05-01: qty 3
  Filled 2022-05-01 (×7): qty 8

## 2022-05-01 MED ORDER — FOOD THICKENER (SIMPLYTHICK): 1.0000 | ORAL | Status: DC | PRN

## 2022-05-01 MED ORDER — SODIUM CHLORIDE 0.9 % IV BOLUS
1000.0000 mL | Freq: Once | INTRAVENOUS | Status: AC
  Administered 2022-05-01: 1000 mL via INTRAVENOUS

## 2022-05-01 MED ORDER — ACETAMINOPHEN 325 MG RE SUPP
650.0000 mg | Freq: Four times a day (QID) | RECTAL | Status: DC | PRN
  Filled 2022-05-01: qty 2

## 2022-05-01 MED ORDER — SODIUM CHLORIDE 0.9 % IV BOLUS
500.0000 mL | Freq: Once | INTRAVENOUS | Status: AC
  Administered 2022-05-01: 500 mL via INTRAVENOUS

## 2022-05-01 MED ORDER — SODIUM CHLORIDE 0.9 % IV SOLN: INTRAVENOUS | Status: DC

## 2022-05-01 NOTE — Progress Notes (Signed)
Initial Nutrition Assessment  DOCUMENTATION CODES:   Underweight, Severe malnutrition in context of chronic illness  INTERVENTION:   -MVI with minerals daily -Magic cup TID with meals, each supplement provides 290 kcal and 9 grams of protein   NUTRITION DIAGNOSIS:   Severe Malnutrition related to chronic illness (autism, epilepsy) as evidenced by moderate fat depletion, severe fat depletion, moderate muscle depletion, severe muscle depletion.  GOAL:   Patient will meet greater than or equal to 90% of their needs  MONITOR:   PO intake, Supplement acceptance  REASON FOR ASSESSMENT:   Consult Assessment of nutrition requirement/status  ASSESSMENT:   Pt with medical history significant of autism, nonverbal status, epilepsy, PVC, intracranial arachnoid cyst, thrombocytopenia, hyponatremia, esophageal stricture, dysphagia, aspiration pneumonia, who presents cough and SOB.  Pt admitted with severe sepsis secondary to aspiration pneumonia.    Reviewed I/O's: -632 ml x 24 hours  UOP: 1.3 L x 24 hours  Case discussed with RN prior to visit. Pt with no difficulty taking medications. Pt does not have any enteral access. SLP just cam by and placed pt on a dysphagia 1 diet with nectar thick liquids.   Spoke with pt, mom, and dad at bedside. Pt is non-verbal; parents provided the history. Pt dad reports that pt is on a puree diet with nectar thick liquids at baseline. Pt eats well and consumes 3 meals and several snacks per day. Pt also consumes Gatorade and juices (which are thickened). Parents puree food and often add extra crackers and gravy to meals for additional calories.   Dad reports that pt used to weigh about 250#. He had a PEG tube secondary to dysphagia, but continued to pull it out. It was last removed about 6-7 years ago and they made the decision to let the pt eat PO. Since PEG removal, pt has been very thin. He has not lost weight recently, but reports that he continues to  be small no matter what he eats. Reviewed wt hx; pt has experienced a 4.2% wt loss over the past 6 months, which is not significant for time frame.    RD discussed importance of good meal and supplement intake to promote healing. Discussed ways to increase calories and protein in diet. Pt likes Magic Cups and would like to have them in the hospital.   Medications reviewed and include 0.9% sodium chloride infusion @ 75 ml/hr, potassium chloride and sodium chloride.  Labs reviewed: Na: 131 (on supplementation), K: 3.2 (on supplementation)  NUTRITION - FOCUSED PHYSICAL EXAM:  Flowsheet Row Most Recent Value  Orbital Region Moderate depletion  Upper Arm Region Moderate depletion  Thoracic and Lumbar Region Severe depletion  Buccal Region Severe depletion  Temple Region Severe depletion  Clavicle Bone Region Severe depletion  Clavicle and Acromion Bone Region Severe depletion  Scapular Bone Region Severe depletion  Dorsal Hand Moderate depletion  Patellar Region Severe depletion  Anterior Thigh Region Severe depletion  Posterior Calf Region Severe depletion  Edema (RD Assessment) None  Hair Reviewed  Eyes Reviewed  Mouth Reviewed  Skin Reviewed  Nails Reviewed       Diet Order:   Diet Order             DIET - DYS 1 Room service appropriate? No; Fluid consistency: Nectar Thick  Diet effective now                   EDUCATION NEEDS:   Education needs have been addressed  Skin:  Skin Assessment:  Reviewed RN Assessment  Last BM:  05/01/22  Height:   Ht Readings from Last 1 Encounters:  11/06/21 5\' 6"  (1.676 m)    Weight:   Wt Readings from Last 1 Encounters:  05/01/22 50 kg    Ideal Body Weight:  64.5 kg  BMI:  Body mass index is 17.79 kg/m.  Estimated Nutritional Needs:   Kcal:  1500-1700  Protein:  85-100 grams  Fluid:  > 1.5 L    Loistine Chance, RD, LDN, [place] Registered Dietitian II Certified Diabetes Care and Education Specialist Please refer to  [facility] for RD and/or RD on-call/weekend/after hours pager

## 2022-05-01 NOTE — H&P (Signed)
History and Physical    Noah Marshall Claw [id number] DOB: 1995/11/28 DOA: 05/01/2022  Referring MD/NP/PA:   PCP: Noah Sons, MD   Patient coming from:  The patient is coming from home.   Chief Complaint: cough and SOB   HPI: Noah Marshall is a 27 y.o. male with medical history significant of autism, nonverbal status, epilepsy, PVC, intracranial arachnoid cyst, thrombocytopenia, hyponatremia, esophageal stricture, dysphagia, aspiration pneumonia, who presents cough and SOB.   Per his parents at the bedside, pt has cough and shortness of breath for more than 2 days.  He coughs up little yellow-colored sputum.  Does not seem to have chest pain.  Patient has fever, and chills.  Patient also has diarrhea in the past 2 days, approximately 5 times of watery diarrhea each day, no active nausea vomiting or abdominal pain per his mother. Pt is usually active, but he has been less interactive in the past 2 days.  No fall.  Data reviewed independently and ED Course: pt was found to have WBC 13.7, lactic acid 1.1 --> 2.2, platelet 103 (110 on 11/07/2021), GFR> 60, sodium 121. Temperature 101.1, blood pressure 101/64, heart rate 110, RR 22, oxygen saturation 94% on room air.  Chest x-ray showed right lower lobe infiltration.  Patient is admitted to telemetry bed as inpatient.   EKG: reviewed independently, QTc 398, sinus rhythm with frequent PVC.   Review of Systems: Could not be reviewed since patient is nonverbal     Allergy:  Allergies  Allergen Reactions   Other Diarrhea, Hives and Itching    Dairy   Peanut-Containing Drug Products Hives and Anaphylaxis   Lactose Intolerance (Gi) Diarrhea    Past Medical History:  Diagnosis Date   Aspiration pneumonia (HCC)    Autism    Epilepsy ([place])    Epilepsy with status epilepticus ([place])    Esophageal stenosis    Intracranial arachnoid cysts    Pes planovalgus    Pneumonia 09/26/2015   Premature ventricular contractions    Respiratory  failure ([place])     Past Surgical History:  Procedure Laterality Date   ESOPHAGOGASTRODUODENOSCOPY (EGD) WITH PROPOFOL N/A 09/14/2020   Procedure: ESOPHAGOGASTRODUODENOSCOPY (EGD) WITH PROPOFOL;  Surgeon: Noah Bellows, MD;  Location[facility] ENDOSCOPY;  Service: Gastroenterology;  Laterality: N/A;   ESOPHAGOGASTRODUODENOSCOPY (EGD) WITH PROPOFOL N/A 10/11/2020   Procedure: ESOPHAGOGASTRODUODENOSCOPY (EGD) WITH PROPOFOL;  Surgeon: Noah Bellows, MD;  Location[facility] ENDOSCOPY;  Service: Gastroenterology;  Laterality: N/A;  with dilation per Dr. Vicente Males   ESOPHAGOGASTRODUODENOSCOPY (EGD) WITH PROPOFOL N/A 11/06/2020   Procedure: ESOPHAGOGASTRODUODENOSCOPY (EGD) WITH PROPOFOL;  Surgeon: Noah Bellows, MD;  Location: [facility] ENDOSCOPY;  Service: Gastroenterology;  Laterality: N/A;   FOREIGN BODY REMOVAL N/A 03/08/2015   Procedure: FOREIGN BODY REMOVAL;  Surgeon: Noah Class, MD;  Location: [facility] ENDOSCOPY;  Service: Endoscopy;  Laterality: N/A;   IMPLANTATION VAGAL NERVE STIMULATOR  2011   REPLACE / REVISE VAGAL NERVE STIMULATOR      Social History:  reports that he has never smoked. He has never used smokeless tobacco. He reports that he does not drink alcohol and does not use drugs.  Family History:  Family History  Problem Relation Age of Onset   Diabetes Mellitus II Father    Hypertension Father      Prior to Admission medications   Medication Sig Start Date End Date Taking? Authorizing Provider  cetirizine HCl (ZYRTEC) 1 MG/ML solution Take 15 mg by mouth daily.    [provider]  cloBAZam (  ONFI) 2.5 MG/ML solution SHAKE WELL AND GIVE 6 ML BY MOUTH TWICE DAILY. Strength: 2.5 mg/mL 02/07/22     clonazePAM (KLONOPIN) 0.5 MG tablet Take 1 tablet (0.5 mg total) by mouth 3 (three) times daily 02/07/22     fluticasone (FLONASE) 50 MCG/ACT nasal spray Place 2 sprays into both nostrils daily.    [provider]  guanFACINE (TENEX) 1 MG tablet TAKE 1 TABLET BY G-TUBE 2 TIMES DAILY  Strength: 1 mg 08/07/21     guanFACINE (TENEX) 1 MG tablet Take 1 tablet (1 mg total) by mouth 2 (two) times daily. 11/08/21   Noah Boroughs, MD  levOCARNitine (CARNITOR) 1 GM/10ML solution Take 5 mLs (500 mg total) by mouth 2 (two) times daily 08/07/21     montelukast (SINGULAIR) 10 MG tablet Take 10 mg by mouth at bedtime.    [provider]  topiramate (TOPAMAX) 100 MG tablet Take 1 tablet (100 mg total) by mouth every morning AND 2 tablets (200 mg total) at bedtime. Oral, crushed. No more g-tube (3 years).. Patient taking differently: Take 100-200 mg by mouth 2 (two) times daily. 08/07/21     topiramate (TOPAMAX) 100 MG tablet Take 1-2 tablets (100-200 mg total) by mouth 2 (two) times daily. 11/08/21   Noah Boroughs, MD  topiramate (TOPAMAX) 200 MG tablet Take 1 tablet (200 mg total) by mouth 2 (two) times daily. 12/11/21     TRILEPTAL 300 MG/5ML suspension TAKE 10 MLS BY MOUTH 2 TIMES DAILY Strength: 300 mg/5 mL (60 mg/mL) 08/07/21     valproic acid (DEPAKENE) 250 MG/5ML solution Take 10 mLs (500 mg total) by mouth 2 (two) times daily 08/07/21       Physical Exam: Vitals:   05/01/22 1230 05/01/22 1306 05/01/22 1330 05/01/22 1436  BP: 107/63  (!) 114/57 104/80  Pulse: 85   92  Resp: 16  (!) 24 16  Temp:  (!) 97.5 F (36.4 C)  98.1 F (36.7 C)  TempSrc:  Rectal    SpO2: 98%  97% 93%  Weight:       General: Not in acute distress.  Thin body habitus HEENT:       Eyes: PERRL, EOMI, no scleral icterus.       ENT: No discharge from the ears and nose.       Neck: No JVD, no bruit, no mass felt. Heme: No neck lymph node enlargement. Cardiac: S1/S2, RRR, No murmurs, No gallops or rubs. Respiratory: No rales, wheezing, rhonchi or rubs. GI: Soft, nondistended, nontender, no organomegaly, BS present. GU: No hematuria Ext: No pitting leg edema bilaterally. 1+DP/PT pulse bilaterally. Musculoskeletal: No joint deformities, No joint redness or warmth, no limitation of ROM in  spin. Skin: No rashes.  Neuro: Alert, nonverbal, cranial nerves II-XII grossly intact, moves all extremities. Psych: Patient is not psychotic, no suicidal or hemocidal ideation.  Labs on Admission: I have personally reviewed following labs and imaging studies  CBC: Recent Labs  Lab 05/01/22 0747  WBC 13.7*  NEUTROABS 11.9*  HGB 12.5*  HCT 35.9*  MCV 88.2  PLT [id number]*   Basic Metabolic Panel: Recent Labs  Lab 05/01/22 0747  NA 121*  K 4.0  CL 92*  CO2 21*  GLUCOSE 97  BUN 14  CREATININE 0.41*  CALCIUM 8.5*   GFR: Estimated Creatinine Clearance: 99 mL/min (A) (by C-G formula based on SCr of 0.41 mg/dL (L)). Liver Function Tests: Recent Labs  Lab 05/01/22 0747  AST 17  ALT 8  ALKPHOS 76  BILITOT 0.6  PROT 7.1  ALBUMIN 3.8   No results for input(s): "LIPASE", "AMYLASE" in the last 168 hours. No results for input(s): "AMMONIA" in the last 168 hours. Coagulation Profile: No results for input(s): "INR", "PROTIME" in the last 168 hours. Cardiac Enzymes: No results for input(s): "CKTOTAL", "CKMB", "CKMBINDEX", "TROPONINI" in the last 168 hours. BNP (last 3 results) No results for input(s): "PROBNP" in the last 8760 hours. HbA1C: No results for input(s): "HGBA1C" in the last 72 hours. CBG: No results for input(s): "GLUCAP" in the last 168 hours. Lipid Profile: No results for input(s): "CHOL", "HDL", "LDLCALC", "TRIG", "CHOLHDL", "LDLDIRECT" in the last 72 hours. Thyroid Function Tests: No results for input(s): "TSH", "T4TOTAL", "FREET4", "T3FREE", "THYROIDAB" in the last 72 hours. Anemia Panel: No results for input(s): "VITAMINB12", "FOLATE", "FERRITIN", "TIBC", "IRON", "RETICCTPCT" in the last 72 hours. Urine analysis:    Component Value Date/Time   COLORURINE YELLOW (A) 11/06/2021 1959   APPEARANCEUR HAZY (A) 11/06/2021 1959   APPEARANCEUR Clear 12/27/2012 0012   LABSPEC 1.012 11/06/2021 1959   LABSPEC 1.017 12/27/2012 0012   PHURINE 7.0 11/06/2021 1959    GLUCOSEU NEGATIVE 11/06/2021 1959   GLUCOSEU Negative 12/27/2012 0012   HGBUR NEGATIVE 11/06/2021 1959   BILIRUBINUR NEGATIVE 11/06/2021 1959   BILIRUBINUR Negative 12/27/2012 0012   KETONESUR NEGATIVE 11/06/2021 1959   PROTEINUR NEGATIVE 11/06/2021 1959   NITRITE NEGATIVE 11/06/2021 1959   LEUKOCYTESUR NEGATIVE 11/06/2021 1959   LEUKOCYTESUR Negative 12/27/2012 0012   Sepsis Labs: @LABRCNTIP (procalcitonin:4,lacticidven:4) ) Recent Results (from the past 240 hour(s))  SARS Coronavirus 2 by RT PCR (hospital order, performed in [place] hospital lab) *cepheid single result test* Anterior Nasal Swab     Status: None   Collection Time: 05/01/22  8:41 AM   Specimen: Anterior Nasal Swab  Result Value Ref Range Status   SARS Coronavirus 2 by RT PCR NEGATIVE NEGATIVE Final    Comment: (NOTE) SARS-CoV-2 target nucleic acids are NOT DETECTED.  The SARS-CoV-2 RNA is generally detectable in upper and lower respiratory specimens during the acute phase of infection. The lowest concentration of SARS-CoV-2 viral copies this assay can detect is 250 copies / mL. A negative result does not preclude SARS-CoV-2 infection and should not be used as the sole basis for treatment or other patient management decisions.  A negative result may occur with improper specimen collection / handling, submission of specimen other than nasopharyngeal swab, presence of viral mutation(s) within the areas targeted by this assay, and inadequate number of viral copies (<250 copies / mL). A negative result must be combined with clinical observations, patient history, and epidemiological information.  Fact Sheet for Patients:   https://www.patel.info/  Fact Sheet for Healthcare Providers: https://hall.com/  This test is not yet approved or  cleared by the [place] FDA and has been authorized for detection and/or diagnosis of SARS-CoV-2 by FDA under an Emergency Use  Authorization (EUA).  This EUA will remain in effect (meaning this test can be used) for the duration of the COVID-19 declaration under Section 564(b)(1) of the Act, 21 U.S.C. section 360bbb-3(b)(1), unless the authorization is terminated or revoked sooner.  Performed at [facility], [place], [place] [address]   C Difficile Quick Screen w PCR reflex     Status: None   Collection Time: 05/01/22  1:09 PM   Specimen: Stool  Result Value Ref Range Status   C Diff antigen NEGATIVE NEGATIVE Final   C Diff toxin NEGATIVE  NEGATIVE Final   C Diff interpretation No C. difficile detected.  Final    Comment: Performed at [facility], [address] [place], [place] [address]  Gastrointestinal Panel by PCR , Stool     Status: Abnormal   Collection Time: 05/01/22  1:09 PM   Specimen: Stool  Result Value Ref Range Status   Campylobacter species NOT DETECTED NOT DETECTED Final   Plesimonas shigelloides NOT DETECTED NOT DETECTED Final   Salmonella species NOT DETECTED NOT DETECTED Final   Yersinia enterocolitica NOT DETECTED NOT DETECTED Final   Vibrio species NOT DETECTED NOT DETECTED Final   Vibrio cholerae NOT DETECTED NOT DETECTED Final   Enteroaggregative E coli (EAEC) NOT DETECTED NOT DETECTED Final   Enteropathogenic E coli (EPEC) NOT DETECTED NOT DETECTED Final   Enterotoxigenic E coli (ETEC) NOT DETECTED NOT DETECTED Final   Shiga like toxin producing E coli (STEC) NOT DETECTED NOT DETECTED Final   Shigella/Enteroinvasive E coli (EIEC) NOT DETECTED NOT DETECTED Final   Cryptosporidium NOT DETECTED NOT DETECTED Final   Cyclospora cayetanensis NOT DETECTED NOT DETECTED Final   Entamoeba histolytica NOT DETECTED NOT DETECTED Final   Giardia lamblia NOT DETECTED NOT DETECTED Final   Adenovirus F40/41 NOT DETECTED NOT DETECTED Final   Astrovirus NOT DETECTED NOT DETECTED Final   Norovirus GI/GII DETECTED (A) NOT DETECTED Final    Comment: RESULT CALLED  TO, READ BACK BY AND VERIFIED WITH: EBONY KELLY AT 1444 ON 05/01/22 BY SS    Rotavirus A NOT DETECTED NOT DETECTED Final   Sapovirus (I, II, IV, and V) NOT DETECTED NOT DETECTED Final    Comment: Performed at [facility], [address] [place], [place] [address]     Radiological Exams on Admission: DG Chest 1 View  Result Date: 05/01/2022 CLINICAL DATA:  Cough possible aspiration EXAM: CHEST  1 VIEW COMPARISON:  X-ray dated December 19, 2021 FINDINGS: Cardiac and mediastinal contours are within normal limits. New right lower lobe consolidation. Evidence of pleural effusion evidence of pneumothorax. IMPRESSION: New right lower lobe consolidation, concerning for aspiration or infection. Electronically Signed   By: Yetta Glassman M.D.   On: 05/01/2022 08:10      Assessment/Plan Principal Problem:   Aspiration pneumonia Active Problems:   Severe sepsis   Acute metabolic encephalopathy   Hyponatremia   Autistic disorder   Seizure disorder   Thrombocytopenia   Diarrhea   Enteritis due to Norovirus   Protein-calorie malnutrition, severe   Assessment and Plan:  Severe sepsis due to aspiration pneumonia: No oxygen desaturation.  Chest x-ray showed left lower lobe infiltration.  Meets criteria for sepsis with WBC 13.7, heart rate 110, RR 22.  Lactic acid is 1.1 --> 2.2  - Will admit to tele bed as inpt - IV Unasyn - Bronchodilators prn - Follow up blood culture x2, sputum culture  - will get Procalcitonin and trend lactic acid level - IVF: 1.5L of NS bolus, followed by 75 mL per hour  -Dysphagia diet  now - f/u SLP  Acute metabolic encephalopathy: pt has been less interactive, likely due to ongoing infection. -Frequent neurocheck  Hyponatremia: Sodium 121, possibly due to diarrhea. - Will check urine sodium, urine osmolality, serum osmolality. - Fluid restriction - IVF: 1L NS in ED, will continue with IV normal saline at 75 mL/h - Sodium chloride tablet 1 g twice  daily - f/u by BMP q8h - avoid over correction too fast due to risk of central pontine myelinolysis  Autistic disorder -Fall precaution -continue Tenex  Seizure disorder:  -Seizure precaution -When necessary Ativan for seizure -Continue Home medications:  clobazam, klonopin, topamax, trilepta -switch oral depakene to IV Depakote 500 mg twice daily -pt is also on levocarnitine  Thrombocytopenia: this is a chronic issue, with unclear etiology. Platelet 103 -Follow-up by CBC  Diarrhea -Follow-up C. Difficile --> negative -f/u GI pathogen panel --> positive for norovius (enteritis due to norovirus infection)  Protein-calorie malnutrition, severe: Body weight 50 kg, BMI 17.79 -Consult to nutrition         DVT ppx:  SQ Lovenox  Code Status: Full code  Family Communication:   Yes, patient's parents at bed side.   Disposition Plan:  Anticipate discharge back to previous environment  Consults called:    Admission status and Level of care: Telemetry Medical:    as inpt     Dispo: The patient is from: Home              Anticipated d/c is to: Home              Anticipated d/c date is: 2 days              Patient currently is not medically stable to d/c.    Severity of Illness:  The appropriate patient status for this patient is INPATIENT. Inpatient status is judged to be reasonable and necessary in order to provide the required intensity of service to ensure the patient's safety. The patient's presenting symptoms, physical exam findings, and initial radiographic and laboratory data in the context of their chronic comorbidities is felt to place them at high risk for further clinical deterioration. Furthermore, it is not anticipated that the patient will be medically stable for discharge from the hospital within 2 midnights of admission.   * I certify that at the point of admission it is my clinical judgment that the patient will require inpatient hospital care spanning  beyond 2 midnights from the point of admission due to high intensity of service, high risk for further deterioration and high frequency of surveillance required.*       Date of Service 05/01/2022    Ivor [facility]   If 7PM-7AM, please contact night-coverage [email or web address] 05/01/2022, 2:54 PM

## 2022-05-01 NOTE — ED Notes (Signed)
1 set of blood cultures sent, abx started

## 2022-05-01 NOTE — ED Notes (Signed)
Pt family states pt has been acting a little off lately. Pt is non-verbal

## 2022-05-01 NOTE — Consult Note (Signed)
Pharmacy Antibiotic Note  Noah Marshall is a 27 y.o. male admitted on 05/01/2022 with aspiration pneumonia.  Pharmacy has been consulted for Unasyn dosing.  Plan: Start Unasyn 3 grams IV every 6 hours Follow renal function for adjustments  Weight: 50 kg (110 lb 3.7 oz)  Temp (24hrs), Avg:101.1 F (38.4 C), Min:101.1 F (38.4 C), Max:101.1 F (38.4 C)  Recent Labs  Lab 05/01/22 0747  WBC 13.7*  CREATININE 0.41*  LATICACIDVEN 1.1    Estimated Creatinine Clearance: 99 mL/min (A) (by C-G formula based on SCr of 0.41 mg/dL (L)).    Allergies  Allergen Reactions   Other Diarrhea, Hives and Itching    Dairy   Peanut-Containing Drug Products Hives and Anaphylaxis   Lactose Intolerance (Gi) Diarrhea    Antimicrobials this admission: Unasyn 4/3 >>   Dose adjustments this admission: N/A  Microbiology results: 4/3 BCx: ordered 4/3 Sputum: ordered    Thank you for allowing pharmacy to be a part of this patient's care.  Lorin Picket, PharmD 05/01/2022 8:39 AM

## 2022-05-01 NOTE — ED Notes (Signed)
Pt had episode of incontinence. Bed linen changed, new chucks placed. This RN asked mom if he would be able to keep an external purewick on, which mom stated "probably not, he's been pulling at the O2 sensor still."

## 2022-05-01 NOTE — ED Notes (Signed)
Informed pt mother, we need a stool and urine specimen. Mom states "he is not potty trained, and doesn't really know when he needs to go so that might be a problem." Mom also endorses pt likes to pull at things. IV taped and wrapped.

## 2022-05-01 NOTE — ED Triage Notes (Signed)
First nurse note: Pt to ED via [facility] from home for possible aspiration. EMS reports pt is usually active but has not been acting himself x2-3 days. Pt has vagal nerve stimulator and hx of seizures.   EMS VS:  HR 87 93% RA CBG 113 99/56 30 CO2  RR 20

## 2022-05-01 NOTE — ED Provider Notes (Signed)
[facility] Provider Note    Event Date/Time   First MD Initiated Contact with Patient 05/01/22 [id number]     (approximate)   History   Altered Mental Status and Fever   HPI  Noah Marshall is a 27 y.o. male   Past medical history of epilepsy, aspiration pneumonia, autism, dysphagia, who presents to the emergency department with 2 days of productive cough, fatigue, less interactiveness per his parents were at bedside.  They concerned he has an infection, has had aspiration pneumonia in the past presenting similarly.  He has had some diarrhea over the last 2 days but no bleeding.  Patient at baseline is nonverbal.  Independent Historian contributed to assessment above: Both of his parents are at bedside to give information as stated above  External Medical Documents Reviewed: October 2023 discharge summary for aspiration pneumonia      Physical Exam   Triage Vital Signs: ED Triage Vitals  Enc Vitals Group     BP 05/01/22 0743 101/64     Pulse Rate 05/01/22 0743 (!) 110     Resp 05/01/22 0743 (!) 22     Temp 05/01/22 0743 (!) 101.1 F (38.4 C)     Temp Source 05/01/22 0743 Rectal     SpO2 05/01/22 0743 94 %     Weight 05/01/22 0744 110 lb 3.7 oz (50 kg)     Height --      Head Circumference --      Peak Flow --      Pain Score --      Pain Loc --      Pain Edu? --      Excl. in [place]? --     Most recent vital signs: Vitals:   05/01/22 0743  BP: 101/64  Pulse: (!) 110  Resp: (!) 22  Temp: (!) 101.1 F (38.4 C)  SpO2: 94%    General: Awake, no distress.  CV:  Good peripheral perfusion.  Resp:  Normal effort.  Abd:  No distention.  Other:  Awake, contracted which is at baseline, tachycardic 110, respirations at 22, and oxygen saturation ranges from 91% to 94% and he is febrile 101.1.  Lungs without focality or wheezing and his abdomen is soft and nontender and his skin appears warm and well-perfused and his neck is supple with full range  of motion.   ED Results / Procedures / Treatments   Labs (all labs ordered are listed, but only abnormal results are displayed) Labs Reviewed  COMPREHENSIVE METABOLIC PANEL - Abnormal; Notable for the following components:      Result Value   Sodium 121 (*)    Chloride 92 (*)    CO2 21 (*)    Creatinine, Ser 0.41 (*)    Calcium 8.5 (*)    All other components within normal limits  CBC WITH DIFFERENTIAL/PLATELET - Abnormal; Notable for the following components:   WBC 13.7 (*)    RBC 4.07 (*)    Hemoglobin 12.5 (*)    HCT 35.9 (*)    Platelets 103 (*)    Neutro Abs 11.9 (*)    Lymphs Abs 0.4 (*)    Monocytes Absolute 1.2 (*)    All other components within normal limits  CULTURE, BLOOD (ROUTINE X 2)  CULTURE, BLOOD (ROUTINE X 2)  SARS CORONAVIRUS 2 BY RT PCR  LACTIC ACID, PLASMA  LACTIC ACID, PLASMA  URINALYSIS, ROUTINE W REFLEX MICROSCOPIC  PROTIME-INR     I ordered  and reviewed the above labs they are notable for a leukocytosis with a white blood cell count of 13.7  EKG  ED ECG REPORT I, Lucillie Garfinkel, the attending physician, personally viewed and interpreted this ECG.   Date: 05/01/2022  EKG Time: 0827  Rate: 85  Rhythm: sinus  Axis: nl  Intervals:PVCs  ST&T Change: no acute ischemic changes    RADIOLOGY I independently reviewed and interpreted chest x-ray and see a right lower/middle lobe infiltrate concerning for pneumonia   PROCEDURES:  Critical Care performed: Yes, see critical care procedure note(s)  .Critical Care  Performed by: Lucillie Garfinkel, MD Authorized by: Lucillie Garfinkel, MD   Critical care provider statement:    Critical care time (minutes):  30   Critical care was time spent personally by me on the following activities:  Development of treatment plan with patient or surrogate, discussions with consultants, evaluation of patient's response to treatment, examination of patient, ordering and review of laboratory studies, ordering and review of  radiographic studies, ordering and performing treatments and interventions, pulse oximetry, re-evaluation of patient's condition and review of old charts    MEDICATIONS ORDERED IN ED: Medications  acetaminophen (TYLENOL) suppository 650 mg (has no administration in time range)  sodium chloride 0.9 % bolus 1,000 mL (has no administration in time range)  0.9 %  sodium chloride infusion (has no administration in time range)  albuterol (PROVENTIL) (2.5 MG/3ML) 0.083% nebulizer solution 2.5 mg (has no administration in time range)  guaiFENesin (ROBITUSSIN) 100 MG/5ML liquid 200 mg (has no administration in time range)    External physician / consultants:  I spoke with hospitalist for admission and regarding care plan for this patient.   IMPRESSION / MDM / ASSESSMENT AND PLAN / ED COURSE  I reviewed the triage vital signs and the nursing notes.                                Patient's presentation is most consistent with acute presentation with potential threat to life or bodily function.  Differential diagnosis includes, but is not limited to, respiratory infection, bacterial pneumonia, aspiration pneumonia, sepsis, metabolic derangement, dehydration   The patient is on the cardiac monitor to evaluate for evidence of arrhythmia and/or significant heart rate changes.  MDM: This is a patient with history of aspiration pneumonia and here with 2 days of lethargy and productive cough found to be febrile with an infiltrate in the right lower lobe concerning for aspiration pneumonia, covered with IV antibiotics ceftriaxone and azithromycin as well as a 30 cc/kg fluid bolus for sepsis.  Concern for sepsis given fever, tachycardia, increased respiratory rate and source bacterial pneumonia.  Given rectal Tylenol as well.  He has an oxygen saturation that fluctuates between 91 and 96% and we try to get him on 2 L nasal cannula but the patient is not tolerating taking it off frequently, will continue to  monitor his oxygen levels which are currently 95%.    Admission.        FINAL CLINICAL IMPRESSION(S) / ED DIAGNOSES   Final diagnoses:  Pneumonia of right lower lobe due to infectious organism  Hypoxia     Rx / DC Orders   ED Discharge Orders     None        Note:  This document was prepared using Dragon voice recognition software and may include unintentional dictation errors.    Lucillie Garfinkel, MD 05/01/22 [phone]

## 2022-05-01 NOTE — ED Notes (Signed)
Pt noted to be pulling off O2 sensor and [place]. Pt O2 sats currently at 95%. Will cont to monitor. Dr. Jacelyn Grip made aware.

## 2022-05-02 DIAGNOSIS — J690 Pneumonitis due to inhalation of food and vomit: Secondary | ICD-10-CM | POA: Diagnosis not present

## 2022-05-02 DIAGNOSIS — E871 Hypo-osmolality and hyponatremia: Secondary | ICD-10-CM

## 2022-05-02 DIAGNOSIS — E876 Hypokalemia: Secondary | ICD-10-CM

## 2022-05-02 LAB — BLOOD CULTURE ID PANEL (REFLEXED) - BCID2

## 2022-05-02 LAB — CBC
HCT: 32.1 % — ABNORMAL LOW (ref 39.0–52.0)
Hemoglobin: 11.4 g/dL — ABNORMAL LOW (ref 13.0–17.0)
MCH: 30.9 pg (ref 26.0–34.0)
MCHC: 35.5 g/dL (ref 30.0–36.0)
MCV: 87 fL (ref 80.0–100.0)
Platelets: 94 10*3/uL — ABNORMAL LOW (ref 150–400)
RBC: 3.69 MIL/uL — ABNORMAL LOW (ref 4.22–5.81)
RDW: 12.4 % (ref 11.5–15.5)
WBC: 11.1 10*3/uL — ABNORMAL HIGH (ref 4.0–10.5)
nRBC: 0 % (ref 0.0–0.2)

## 2022-05-02 LAB — BASIC METABOLIC PANEL
Anion gap: 7 (ref 5–15)
Anion gap: 8 (ref 5–15)
BUN: 12 mg/dL (ref 6–20)
BUN: 15 mg/dL (ref 6–20)
CO2: 20 mmol/L — ABNORMAL LOW (ref 22–32)
CO2: 17 mmol/L — ABNORMAL LOW (ref 22–32)
Calcium: 8.2 mg/dL — ABNORMAL LOW (ref 8.9–10.3)
Calcium: 8.3 mg/dL — ABNORMAL LOW (ref 8.9–10.3)
Chloride: 104 mmol/L (ref 98–111)
Chloride: 109 mmol/L (ref 98–111)
Creatinine, Ser: 0.37 mg/dL — ABNORMAL LOW (ref 0.61–1.24)
Creatinine, Ser: 0.4 mg/dL — ABNORMAL LOW (ref 0.61–1.24)
GFR, Estimated: >60 mL/min (ref >60)
GFR, Estimated: >60 mL/min (ref >60)
Glucose, Bld: 85 mg/dL (ref 70–99)
Glucose, Bld: 84 mg/dL (ref 70–99)
Potassium: 3.2 mmol/L — ABNORMAL LOW (ref 3.5–5.1)
Potassium: 3.4 mmol/L — ABNORMAL LOW (ref 3.5–5.1)
Sodium: 131 mmol/L — ABNORMAL LOW (ref 135–145)
Sodium: 134 mmol/L — ABNORMAL LOW (ref 135–145)

## 2022-05-02 LAB — OSMOLALITY: Osmolality: 276 mOsm/kg (ref 275–295)

## 2022-05-02 LAB — LACTIC ACID, PLASMA: Lactic Acid, Venous: 0.8 mmol/L (ref 0.5–1.9)

## 2022-05-02 MED ORDER — POTASSIUM CHLORIDE CRYS ER 20 MEQ PO TBCR
40.0000 meq | EXTENDED_RELEASE_TABLET | Freq: Once | ORAL | Status: AC
  Administered 2022-05-02: 40 meq via ORAL
  Filled 2022-05-02: qty 2

## 2022-05-02 NOTE — Evaluation (Signed)
Physical Therapy Evaluation Patient Details Name: Noah Marshall MRN: [id number] DOB: 1996/01/18 Today's Date: 05/02/2022  History of Present Illness  Noah Marshall is a 27 y.o. male with medical history significant of autism, nonverbal status, epilepsy, PVC, intracranial arachnoid cyst, thrombocytopenia, hyponatremia, esophageal stricture, dysphagia, aspiration pneumonia, who presents cough and SOB. Patient positive for norovirus.   Clinical Impression  Patient received in bed, he is alert, non-verbal, mother at bedside. Mother agrees to PT/OT assessment. Patient is able to follow commands to get out of bed and walk. Requires single UE support at this time for safety and balance. He will continue to benefit from skilled PT to improve strength and balance for return home with family.          Recommendations for follow up therapy are one component of a multi-disciplinary discharge planning process, led by the attending physician.  Recommendations may be updated based on patient status, additional functional criteria and insurance authorization.  Follow Up Recommendations       Assistance Recommended at Discharge Frequent or constant Supervision/Assistance  Patient can return home with the following  A little help with walking and/or transfers;A little help with bathing/dressing/bathroom;Assist for transportation;Help with stairs or ramp for entrance    Equipment Recommendations None recommended by PT  Recommendations for Other Services       Functional Status Assessment Patient has had a recent decline in their functional status and demonstrates the ability to make significant improvements in function in a reasonable and predictable amount of time.     Precautions / Restrictions Precautions Precautions: Fall Restrictions Weight Bearing Restrictions: No      Mobility  Bed Mobility Overal bed mobility: Needs Assistance Bed Mobility: Supine to Sit, Sit to Supine     Supine to  sit: Min guard Sit to supine: Min guard        Transfers Overall transfer level: Needs assistance Equipment used: 1 person hand held assist Transfers: Sit to/from Stand Sit to Stand: Min guard                Ambulation/Gait Ambulation/Gait assistance: Min guard Gait Distance (Feet): 50 Feet Assistive device: 1 person hand held assist Gait Pattern/deviations: Step-through pattern, Decreased step length - right, Decreased step length - left, Decreased stride length, Drifts right/left Gait velocity: decreased     General Gait Details: some unsteadiness noted, single UE support.  Stairs            Wheelchair Mobility    Modified Rankin (Stroke Patients Only)       Balance Overall balance assessment: Needs assistance Sitting-balance support: Feet supported Sitting balance-Leahy Scale: Good     Standing balance support: Single extremity supported, During functional activity Standing balance-Leahy Scale: Fair                               Pertinent Vitals/Pain Pain Assessment Breathing: normal Negative Vocalization: occasional moan/groan, low speech, negative/disapproving quality Facial Expression: smiling or inexpressive Body Language: relaxed Consolability: no need to console PAINAD Score: 1    Home Living Family/patient expects to be discharged to:: Private residence Living Arrangements: Parent Available Help at Discharge: Family;Available 24 hours/day Type of Home: House           Home Equipment: None      Prior Function Prior Level of Function : Independent/Modified Independent             Mobility Comments: patient ambulates  around house at baseline per mother ADLs Comments: requires assistance at baseline     Hand Dominance        Extremity/Trunk Assessment   Upper Extremity Assessment Upper Extremity Assessment: Defer to OT evaluation    Lower Extremity Assessment Lower Extremity Assessment: Generalized  weakness    Cervical / Trunk Assessment Cervical / Trunk Assessment: Normal  Communication   Communication: Other (comment) (non verbal at baseline)  Cognition Arousal/Alertness: Awake/alert Behavior During Therapy: WFL for tasks assessed/performed Overall Cognitive Status: History of cognitive impairments - at baseline                                          General Comments      Exercises     Assessment/Plan    PT Assessment Patient needs continued PT services  PT Problem List Decreased strength;Decreased activity tolerance;Decreased balance;Decreased mobility       PT Treatment Interventions Gait training    PT Goals (Current goals can be found in the Care Plan section)  Acute Rehab PT Goals Patient Stated Goal: for patient to return home with family PT Goal Formulation: With family Time For Goal Achievement: 05/09/22 Potential to Achieve Goals: Good    Frequency Min 2X/week     Co-evaluation               AM-PAC PT "6 Clicks" Mobility  Outcome Measure Help needed turning from your back to your side while in a flat bed without using bedrails?: A Little Help needed moving from lying on your back to sitting on the side of a flat bed without using bedrails?: A Little Help needed moving to and from a bed to a chair (including a wheelchair)?: A Little Help needed standing up from a chair using your arms (e.g., wheelchair or bedside chair)?: A Little Help needed to walk in hospital room?: A Little Help needed climbing 3-5 steps with a railing? : A Little 6 Click Score: 18    End of Session   Activity Tolerance: Patient tolerated treatment well Patient left: in bed;with call bell/phone within reach;with bed alarm set;with family/visitor present Nurse Communication: Mobility status PT Visit Diagnosis: Unsteadiness on feet (R26.81);Muscle weakness (generalized) (M62.81);Difficulty in walking, not elsewhere classified (R26.2)    Time:  [id number] PT Time Calculation (min) (ACUTE ONLY): 13 min   Charges:   PT Evaluation $PT Eval Low Complexity: 1 Low          Necola Bluestein, PT, GCS 05/02/22,2:56 PM

## 2022-05-02 NOTE — Progress Notes (Signed)
PHARMACY - PHYSICIAN COMMUNICATION CRITICAL VALUE ALERT - BLOOD CULTURE IDENTIFICATION (BCID)  Results for orders placed or performed during the hospital encounter of 05/01/22  SARS Coronavirus 2 by RT PCR (hospital order, performed in [facility] hospital lab) *cepheid single result test* Anterior Nasal Swab     Status: None   Collection Time: 05/01/22  8:41 AM   Specimen: Anterior Nasal Swab  Result Value Ref Range Status   SARS Coronavirus 2 by RT PCR NEGATIVE NEGATIVE Final    Comment: (NOTE) SARS-CoV-2 target nucleic acids are NOT DETECTED.  The SARS-CoV-2 RNA is generally detectable in upper and lower respiratory specimens during the acute phase of infection. The lowest concentration of SARS-CoV-2 viral copies this assay can detect is 250 copies / mL. A negative result does not preclude SARS-CoV-2 infection and should not be used as the sole basis for treatment or other patient management decisions.  A negative result may occur with improper specimen collection / handling, submission of specimen other than nasopharyngeal swab, presence of viral mutation(s) within the areas targeted by this assay, and inadequate number of viral copies (<250 copies / mL). A negative result must be combined with clinical observations, patient history, and epidemiological information.  Fact Sheet for Patients:   https://www.patel.info/  Fact Sheet for Healthcare Providers: https://hall.com/  This test is not yet approved or  cleared by the [place] FDA and has been authorized for detection and/or diagnosis of SARS-CoV-2 by FDA under an Emergency Use Authorization (EUA).  This EUA will remain in effect (meaning this test can be used) for the duration of the COVID-19 declaration under Section 564(b)(1) of the Act, 21 U.S.C. section 360bbb-3(b)(1), unless the authorization is terminated or revoked sooner.  Performed at [facility], [address] [place], [place] [address]   Culture, blood (Routine x 2)     Status: None (Preliminary result)   Collection Time: 05/01/22 10:52 AM   Specimen: BLOOD  Result Value Ref Range Status   Specimen Description BLOOD LEFT FA  Final   Special Requests   Final    BOTTLES DRAWN AEROBIC AND ANAEROBIC Blood Culture adequate volume   Culture  Setup Time   Final    GRAM POSITIVE COCCI ANAEROBIC BOTTLE ONLY Organism ID to follow CRITICAL RESULT CALLED TO, READ BACK BY AND VERIFIED WITH: Gavina Dildine [email or web address]  05/02/22 RH Performed at [facility], [address] [place], [place] [address]    Culture GRAM POSITIVE COCCI  Final   Report Status PENDING  Incomplete  Blood Culture ID Panel (Reflexed)     Status: Abnormal   Collection Time: 05/01/22 10:52 AM  Result Value Ref Range Status   Enterococcus faecalis NOT DETECTED NOT DETECTED Final   Enterococcus Faecium NOT DETECTED NOT DETECTED Final   Listeria monocytogenes NOT DETECTED NOT DETECTED Final   Staphylococcus species DETECTED (A) NOT DETECTED Final    Comment: CRITICAL RESULT CALLED TO, READ BACK BY AND VERIFIED WITH: Teosha Casso BLUE@[email or web address]  05/02/22 RH    Staphylococcus aureus (BCID) NOT DETECTED NOT DETECTED Final   Staphylococcus epidermidis NOT DETECTED NOT DETECTED Final   Staphylococcus lugdunensis NOT DETECTED NOT DETECTED Final   Streptococcus species NOT DETECTED NOT DETECTED Final   Streptococcus agalactiae NOT DETECTED NOT DETECTED Final   Streptococcus pneumoniae NOT DETECTED NOT DETECTED Final   Streptococcus pyogenes NOT DETECTED NOT DETECTED Final   A.calcoaceticus-baumannii NOT DETECTED NOT DETECTED Final   Bacteroides fragilis NOT DETECTED NOT DETECTED Final   Enterobacterales NOT DETECTED NOT  DETECTED Final   Enterobacter cloacae complex NOT DETECTED NOT DETECTED Final   Escherichia coli NOT DETECTED NOT DETECTED Final   Klebsiella aerogenes NOT DETECTED NOT DETECTED Final   Klebsiella oxytoca NOT DETECTED  NOT DETECTED Final   Klebsiella pneumoniae NOT DETECTED NOT DETECTED Final   Proteus species NOT DETECTED NOT DETECTED Final   Salmonella species NOT DETECTED NOT DETECTED Final   Serratia marcescens NOT DETECTED NOT DETECTED Final   Haemophilus influenzae NOT DETECTED NOT DETECTED Final   Neisseria meningitidis NOT DETECTED NOT DETECTED Final   Pseudomonas aeruginosa NOT DETECTED NOT DETECTED Final   Stenotrophomonas maltophilia NOT DETECTED NOT DETECTED Final   Candida albicans NOT DETECTED NOT DETECTED Final   Candida auris NOT DETECTED NOT DETECTED Final   Candida glabrata NOT DETECTED NOT DETECTED Final   Candida krusei NOT DETECTED NOT DETECTED Final   Candida parapsilosis NOT DETECTED NOT DETECTED Final   Candida tropicalis NOT DETECTED NOT DETECTED Final   Cryptococcus neoformans/gattii NOT DETECTED NOT DETECTED Final    Comment: Performed at [facility], [place], [place] [address]  C Difficile Quick Screen w PCR reflex     Status: None   Collection Time: 05/01/22  1:09 PM   Specimen: Stool  Result Value Ref Range Status   C Diff antigen NEGATIVE NEGATIVE Final   C Diff toxin NEGATIVE NEGATIVE Final   C Diff interpretation No C. difficile detected.  Final    Comment: Performed at [facility], [address] [place], [place] [address]  Gastrointestinal Panel by PCR , Stool     Status: Abnormal   Collection Time: 05/01/22  1:09 PM   Specimen: Stool  Result Value Ref Range Status   Campylobacter species NOT DETECTED NOT DETECTED Final   Plesimonas shigelloides NOT DETECTED NOT DETECTED Final   Salmonella species NOT DETECTED NOT DETECTED Final   Yersinia enterocolitica NOT DETECTED NOT DETECTED Final   Vibrio species NOT DETECTED NOT DETECTED Final   Vibrio cholerae NOT DETECTED NOT DETECTED Final   Enteroaggregative E coli (EAEC) NOT DETECTED NOT DETECTED Final   Enteropathogenic E coli (EPEC) NOT DETECTED NOT DETECTED Final    Enterotoxigenic E coli (ETEC) NOT DETECTED NOT DETECTED Final   Shiga like toxin producing E coli (STEC) NOT DETECTED NOT DETECTED Final   Shigella/Enteroinvasive E coli (EIEC) NOT DETECTED NOT DETECTED Final   Cryptosporidium NOT DETECTED NOT DETECTED Final   Cyclospora cayetanensis NOT DETECTED NOT DETECTED Final   Entamoeba histolytica NOT DETECTED NOT DETECTED Final   Giardia lamblia NOT DETECTED NOT DETECTED Final   Adenovirus F40/41 NOT DETECTED NOT DETECTED Final   Astrovirus NOT DETECTED NOT DETECTED Final   Norovirus GI/GII DETECTED (A) NOT DETECTED Final    Comment: RESULT CALLED TO, READ BACK BY AND VERIFIED WITH: EBONY KELLY AT 1444 ON 05/01/22 BY SS    Rotavirus A NOT DETECTED NOT DETECTED Final   Sapovirus (I, II, IV, and V) NOT DETECTED NOT DETECTED Final    Comment: Performed at [facility], [address] [place], [place] [address]    BCID Results: 1 (anaerobic) of 4 bottles w/ Staph Species, no resistance.  Pt currently on Unasyn for asp. pneumonia.   Name of provider contacted: Morton Amy, NP   Changes to prescribed antibiotics required: No changes at this time.  Renda Rolls, PharmD, [facility] 05/02/2022 4:35 AM

## 2022-05-02 NOTE — Evaluation (Signed)
Occupational Therapy Evaluation Patient Details Name: Noah Marshall MRN: [id number] DOB: [birth year] Today's Date: 05/02/2022   History of Present Illness Reshaun Baerga Charpentier is a 27 y.o. male with medical history significant of autism, nonverbal status, epilepsy, PVC, intracranial arachnoid cyst, thrombocytopenia, hyponatremia, esophageal stricture, dysphagia, aspiration pneumonia, who presents cough and SOB. Patient positive for norovirus.   Clinical Impression   Mr Kazmierczak was seen for OT/PT co-evaluation this date. Prior to hospital admission, pt was IND for mobility, dependent for ADLs. Pt lives with parents. Pt presents to acute OT demonstrating near baseline for ADLs, decreased activity tolerance for mobility only. No skilled acute OT needs identified, will sign off. Upon hospital discharge, recommend no follow up therapy.   Recommendations for follow up therapy are one component of a multi-disciplinary discharge planning process, led by the attending physician.  Recommendations may be updated based on patient status, additional functional criteria and insurance authorization.   Assistance Recommended at Discharge Frequent or constant Supervision/Assistance  Patient can return home with the following Help with stairs or ramp for entrance    Functional Status Assessment  Patient has had a recent decline in their functional status and/or demonstrates limited ability to make significant improvements in function in a reasonable and predictable amount of time  Equipment Recommendations  None recommended by OT    Recommendations for Other Services       Precautions / Restrictions Precautions Precautions: Fall Restrictions Weight Bearing Restrictions: No      Mobility Bed Mobility Overal bed mobility: Modified Independent                  Transfers Overall transfer level: Needs assistance Equipment used: 1 person hand held assist Transfers: Sit to/from Stand Sit to Stand: Min  guard                  Balance Overall balance assessment: Needs assistance Sitting-balance support: Feet supported Sitting balance-Leahy Scale: Good     Standing balance support: Single extremity supported, During functional activity Standing balance-Leahy Scale: Fair                             ADL either performed or assessed with clinical judgement   ADL Overall ADL's : At baseline                                       General ADL Comments: MAX A don B socks bed level      Pertinent Vitals/Pain Pain Assessment Pain Assessment: Faces Faces Pain Scale: No hurt     Hand Dominance     Extremity/Trunk Assessment Upper Extremity Assessment Upper Extremity Assessment: Generalized weakness   Lower Extremity Assessment Lower Extremity Assessment: Generalized weakness   Cervical / Trunk Assessment Cervical / Trunk Assessment: Normal   Communication Communication Communication: Other (comment) (non verbal at baseline)   Cognition Arousal/Alertness: Awake/alert Behavior During Therapy: WFL for tasks assessed/performed Overall Cognitive Status: History of cognitive impairments - at baseline                                                  [place] expects to be discharged to:: Private residence Living Arrangements: Parent Available Help at  Discharge: Family;Available 24 hours/day Type of Home: House                       Home Equipment: None          Prior Functioning/Environment Prior Level of Function : Independent/Modified Independent             Mobility Comments: patient ambulates around house at baseline per mother ADLs Comments: requires assistance for all ADLs, performs toileting/dressing in standing (does not toilet per mother pt is not potty trained)        OT Problem List: Decreased strength;Impaired balance (sitting and/or standing)         OT  Goals(Current goals can be found in the care plan section) Acute Rehab OT Goals Patient Stated Goal: to go home OT Goal Formulation: With family Time For Goal Achievement: 05/16/22 Potential to Achieve Goals: Fair   AM-PAC OT "6 Clicks" Daily Activity     Outcome Measure Help from another person eating meals?: None Help from another person taking care of personal grooming?: A Little Help from another person toileting, which includes using toliet, bedpan, or urinal?: A Lot Help from another person bathing (including washing, rinsing, drying)?: A Lot Help from another person to put on and taking off regular upper body clothing?: A Little Help from another person to put on and taking off regular lower body clothing?: A Lot 6 Click Score: 16   End of Session    Activity Tolerance: Patient tolerated treatment well Patient left: in bed;with call bell/phone within reach;with bed alarm set;with family/visitor present  OT Visit Diagnosis: Other abnormalities of gait and mobility (R26.89);Muscle weakness (generalized) (M62.81)                Time: [id number] OT Time Calculation (min): 14 min Charges:  OT General Charges $OT Visit: 1 Visit OT Evaluation $OT Eval Low Complexity: 1 Low  Dessie Coma, M.S. OTR/L  05/02/22, 3:12 PM  ascom [phone]

## 2022-05-02 NOTE — Progress Notes (Signed)
PROGRESS NOTE    Noah Marshall  [id number] DOB: May 20, 1995 DOA: 05/01/2022 PCP: Birdie Sons, MD   Assessment & Plan:   Principal Problem:   Aspiration pneumonia Active Problems:   Severe sepsis   Acute metabolic encephalopathy   Hyponatremia   Autistic disorder   Seizure disorder   Thrombocytopenia   Diarrhea   Enteritis due to Norovirus   Protein-calorie malnutrition, severe  Assessment and Plan:  Severe sepsis: met criteria w/ leukocytosis, tachycardia, tachypnea, elevated lactic acid & aspiration pneumonia. Continue on IV unasyn, bronchodilators. Speech consulted    Acute metabolic encephalopathy: likely due to above infection. Continue w/ IV abxs. Continue w/ supportive care    Hyponatremia: possibly due to diarrhea. Continue on NaCl tabs & fluid restriction.  Hypokalemia: potassium given   Normocytic anemia: no need for a transfusion currently    Autistic disorder: continue w/ supportive care    Seizure disorder: continue on home dose of clobazam, klonopin, topamax, trilepta, levocarnitine, depakote. Seizure precautions    Thrombocytopenia: chronic and labile    Diarrhea: c. diff was neg. GI PCR panel was positive for norovirus   Severe protein calorie malnutrition: nutrition consulted       DVT prophylaxis: lovenox  Code Status: full  Family Communication: discussed pt's care w/ pt's father at bedside and answered his questions  Disposition Plan: likely d/c back home   Level of care: Telemetry Medical  Status is: Inpatient Remains inpatient appropriate because: severity of illness    Consultants:    Procedures:   Antimicrobials: unasyn    Subjective: Pt is nonverbal at baseline   Objective: Vitals:   05/01/22 1306 05/01/22 1330 05/01/22 1436 05/02/22 0809  BP:  (!) 114/57 104/80 102/66  Pulse:   92 92  Resp:  (!) 24 16 16   Temp: (!) 97.5 F (36.4 C)  98.1 F (36.7 C) 98.8 F (37.1 C)  TempSrc: Rectal     SpO2:  97% 93% 98%   Weight:        Intake/Output Summary (Last 24 hours) at 05/02/2022 0812 Last data filed at 05/02/2022 0500 Gross per 24 hour  Intake 618.46 ml  Output 1250 ml  Net -631.54 ml   Filed Weights   05/01/22 0744  Weight: 50 kg    Examination:  General exam: Appears comfortable  Respiratory system: decreased breath sounds b/l  Cardiovascular system: S1 & S2+. No rubs, gallops or clicks.  Gastrointestinal system: Abdomen is nondistended, soft and nontender.  Normal bowel sounds heard. Central nervous system: Alert and awake Psychiatry: Judgement and insight appears at baseline. Mood & affect appropriate.     Data Reviewed: I have personally reviewed following labs and imaging studies  CBC: Recent Labs  Lab 05/01/22 0747 05/02/22 0323  WBC 13.7* 11.1*  NEUTROABS 11.9*  --   HGB 12.5* 11.4*  HCT 35.9* 32.1*  MCV 88.2 87.0  PLT 103* 94*   Basic Metabolic Panel: Recent Labs  Lab 05/01/22 0747 05/01/22 1928 05/02/22 0323  NA 121* 130* 131*  K 4.0 3.8 3.2*  CL 92* 101 104  CO2 21* 19* 20*  GLUCOSE 97 96 85  BUN 14 10 12   CREATININE 0.41* <0.30* 0.37*  CALCIUM 8.5* 8.3* 8.2*   GFR: Estimated Creatinine Clearance: 99 mL/min (A) (by C-G formula based on SCr of 0.37 mg/dL (L)). Liver Function Tests: Recent Labs  Lab 05/01/22 0747  AST 17  ALT 8  ALKPHOS 76  BILITOT 0.6  PROT 7.1  ALBUMIN 3.8  No results for input(s): "LIPASE", "AMYLASE" in the last 168 hours. No results for input(s): "AMMONIA" in the last 168 hours. Coagulation Profile: Recent Labs  Lab 05/01/22 1459  INR 1.3*   Cardiac Enzymes: No results for input(s): "CKTOTAL", "CKMB", "CKMBINDEX", "TROPONINI" in the last 168 hours. BNP (last 3 results) No results for input(s): "PROBNP" in the last 8760 hours. HbA1C: No results for input(s): "HGBA1C" in the last 72 hours. CBG: No results for input(s): "GLUCAP" in the last 168 hours. Lipid Profile: No results for input(s): "CHOL", "HDL", "LDLCALC",  "TRIG", "CHOLHDL", "LDLDIRECT" in the last 72 hours. Thyroid Function Tests: No results for input(s): "TSH", "T4TOTAL", "FREET4", "T3FREE", "THYROIDAB" in the last 72 hours. Anemia Panel: No results for input(s): "VITAMINB12", "FOLATE", "FERRITIN", "TIBC", "IRON", "RETICCTPCT" in the last 72 hours. Sepsis Labs: Recent Labs  Lab 05/01/22 0747 05/01/22 1051 05/01/22 1459 05/01/22 1742  PROCALCITON 0.18  --   --   --   LATICACIDVEN 1.1 2.2* 3.0* 2.1*    Recent Results (from the past 240 hour(s))  SARS Coronavirus 2 by RT PCR (hospital order, performed in [facility] hospital lab) *cepheid single result test* Anterior Nasal Swab     Status: None   Collection Time: 05/01/22  8:41 AM   Specimen: Anterior Nasal Swab  Result Value Ref Range Status   SARS Coronavirus 2 by RT PCR NEGATIVE NEGATIVE Final    Comment: (NOTE) SARS-CoV-2 target nucleic acids are NOT DETECTED.  The SARS-CoV-2 RNA is generally detectable in upper and lower respiratory specimens during the acute phase of infection. The lowest concentration of SARS-CoV-2 viral copies this assay can detect is 250 copies / mL. A negative result does not preclude SARS-CoV-2 infection and should not be used as the sole basis for treatment or other patient management decisions.  A negative result may occur with improper specimen collection / handling, submission of specimen other than nasopharyngeal swab, presence of viral mutation(s) within the areas targeted by this assay, and inadequate number of viral copies (<250 copies / mL). A negative result must be combined with clinical observations, patient history, and epidemiological information.  Fact Sheet for Patients:   https://www.patel.info/  Fact Sheet for Healthcare Providers: https://hall.com/  This test is not yet approved or  cleared by the [place] FDA and has been authorized for detection and/or diagnosis of SARS-CoV-2  by FDA under an Emergency Use Authorization (EUA).  This EUA will remain in effect (meaning this test can be used) for the duration of the COVID-19 declaration under Section 564(b)(1) of the Act, 21 U.S.C. section 360bbb-3(b)(1), unless the authorization is terminated or revoked sooner.  Performed at [facility], [address] [place], [place] [address]   Culture, blood (Routine x 2)     Status: None (Preliminary result)   Collection Time: 05/01/22 10:52 AM   Specimen: BLOOD  Result Value Ref Range Status   Specimen Description BLOOD LEFT FA  Final   Special Requests   Final    BOTTLES DRAWN AEROBIC AND ANAEROBIC Blood Culture adequate volume   Culture  Setup Time   Final    GRAM POSITIVE COCCI ANAEROBIC BOTTLE ONLY Organism ID to follow CRITICAL RESULT CALLED TO, READ BACK BY AND VERIFIED WITH: NATHAN BLUE@[id number]  05/02/22 RH Performed at [facility], [address], [place], [place] [address]    Culture [facility] POSITIVE COCCI  Final   Report Status PENDING  Incomplete  Blood Culture ID Panel (Reflexed)     Status: Abnormal  Collection Time: 05/01/22 10:52 AM  Result Value Ref Range Status   Enterococcus faecalis NOT DETECTED NOT DETECTED Final   Enterococcus Faecium NOT DETECTED NOT DETECTED Final   Listeria monocytogenes NOT DETECTED NOT DETECTED Final   Staphylococcus species DETECTED (A) NOT DETECTED Final    Comment: CRITICAL RESULT CALLED TO, READ BACK BY AND VERIFIED WITH: NATHAN BLUE@[id number]  05/02/22 RH    Staphylococcus aureus (BCID) NOT DETECTED NOT DETECTED Final   Staphylococcus epidermidis NOT DETECTED NOT DETECTED Final   Staphylococcus lugdunensis NOT DETECTED NOT DETECTED Final   Streptococcus species NOT DETECTED NOT DETECTED Final   Streptococcus agalactiae NOT DETECTED NOT DETECTED Final   Streptococcus pneumoniae NOT DETECTED NOT DETECTED Final   Streptococcus pyogenes NOT DETECTED NOT DETECTED Final   A.calcoaceticus-baumannii NOT DETECTED  NOT DETECTED Final   Bacteroides fragilis NOT DETECTED NOT DETECTED Final   Enterobacterales NOT DETECTED NOT DETECTED Final   Enterobacter cloacae complex NOT DETECTED NOT DETECTED Final   Escherichia coli NOT DETECTED NOT DETECTED Final   Klebsiella aerogenes NOT DETECTED NOT DETECTED Final   Klebsiella oxytoca NOT DETECTED NOT DETECTED Final   Klebsiella pneumoniae NOT DETECTED NOT DETECTED Final   Proteus species NOT DETECTED NOT DETECTED Final   Salmonella species NOT DETECTED NOT DETECTED Final   Serratia marcescens NOT DETECTED NOT DETECTED Final   Haemophilus influenzae NOT DETECTED NOT DETECTED Final   Neisseria meningitidis NOT DETECTED NOT DETECTED Final   Pseudomonas aeruginosa NOT DETECTED NOT DETECTED Final   Stenotrophomonas maltophilia NOT DETECTED NOT DETECTED Final   Candida albicans NOT DETECTED NOT DETECTED Final   Candida auris NOT DETECTED NOT DETECTED Final   Candida glabrata NOT DETECTED NOT DETECTED Final   Candida krusei NOT DETECTED NOT DETECTED Final   Candida parapsilosis NOT DETECTED NOT DETECTED Final   Candida tropicalis NOT DETECTED NOT DETECTED Final   Cryptococcus neoformans/gattii NOT DETECTED NOT DETECTED Final    Comment: Performed at [facility], [address] [place], [place] [address]  C Difficile Quick Screen w PCR reflex     Status: None   Collection Time: 05/01/22  1:09 PM   Specimen: Stool  Result Value Ref Range Status   C Diff antigen NEGATIVE NEGATIVE Final   C Diff toxin NEGATIVE NEGATIVE Final   C Diff interpretation No C. difficile detected.  Final    Comment: Performed at [facility], [address] [place], [place] [address]  Gastrointestinal Panel by PCR , Stool     Status: Abnormal   Collection Time: 05/01/22  1:09 PM   Specimen: Stool  Result Value Ref Range Status   Campylobacter species NOT DETECTED NOT DETECTED Final   Plesimonas shigelloides NOT DETECTED NOT DETECTED Final   Salmonella species  NOT DETECTED NOT DETECTED Final   Yersinia enterocolitica NOT DETECTED NOT DETECTED Final   Vibrio species NOT DETECTED NOT DETECTED Final   Vibrio cholerae NOT DETECTED NOT DETECTED Final   Enteroaggregative E coli (EAEC) NOT DETECTED NOT DETECTED Final   Enteropathogenic E coli (EPEC) NOT DETECTED NOT DETECTED Final   Enterotoxigenic E coli (ETEC) NOT DETECTED NOT DETECTED Final   Shiga like toxin producing E coli (STEC) NOT DETECTED NOT DETECTED Final   Shigella/Enteroinvasive E coli (EIEC) NOT DETECTED NOT DETECTED Final   Cryptosporidium NOT DETECTED NOT DETECTED Final   Cyclospora cayetanensis NOT DETECTED NOT DETECTED Final   Entamoeba histolytica NOT DETECTED NOT DETECTED Final   Giardia lamblia NOT DETECTED NOT DETECTED Final  Adenovirus F40/41 NOT DETECTED NOT DETECTED Final   Astrovirus NOT DETECTED NOT DETECTED Final   Norovirus GI/GII DETECTED (A) NOT DETECTED Final    Comment: RESULT CALLED TO, READ BACK BY AND VERIFIED WITH: EBONY KELLY AT 1444 ON 05/01/22 BY SS    Rotavirus A NOT DETECTED NOT DETECTED Final   Sapovirus (I, II, IV, and V) NOT DETECTED NOT DETECTED Final    Comment: Performed at [facility], [address] [place], [place] [address]         Radiology Studies: DG Chest 1 View  Result Date: 05/01/2022 CLINICAL DATA:  Cough possible aspiration EXAM: CHEST  1 VIEW COMPARISON:  X-ray dated December 19, 2021 FINDINGS: Cardiac and mediastinal contours are within normal limits. New right lower lobe consolidation. Evidence of pleural effusion evidence of pneumothorax. IMPRESSION: New right lower lobe consolidation, concerning for aspiration or infection. Electronically Signed   By: Yetta Glassman M.D.   On: 05/01/2022 08:10        Scheduled Meds:  cetirizine HCl  15 mg Oral Daily   cloBAZam  15 mg Oral BID   clonazePAM  0.5 mg Oral TID   enoxaparin (LOVENOX) injection  40 mg Subcutaneous Q24H   fluticasone  2 spray Each Nare Daily    guanFACINE  1 mg Oral BID   levOCARNitine  500 mg Oral BID   montelukast  10 mg Oral QHS   OXcarbazepine  600 mg Oral BID   sodium chloride  1 g Oral BID WC   topiramate  200 mg Oral BID   Continuous Infusions:  sodium chloride 75 mL/hr at 05/01/22 2334   ampicillin-sulbactam (UNASYN) IV 3 g (05/02/22 0335)   valproate sodium 500 mg (05/01/22 2334)     LOS: 1 day    Time spent: 35 mins     Wyvonnia Dusky, MD [facility] Pager [phone]  If 7PM-7AM, please contact night-coverage [email or web address] 05/02/2022, 8:12 AM

## 2022-05-02 NOTE — Evaluation (Signed)
Clinical/Bedside Swallow Evaluation Patient Details  Name: Noah Marshall MRN: [id number] Date of Birth: Aug 02, 1995  Today's Date: 05/02/2022 Time: SLP Start Time (ACUTE ONLY): [id number] SLP Stop Time (ACUTE ONLY): 0946 SLP Time Calculation (min) (ACUTE ONLY): 23 min  Past Medical History:  Past Medical History:  Diagnosis Date   Aspiration pneumonia ([place])    Autism    Epilepsy ([place])    Epilepsy with status epilepticus ([place])    Esophageal stenosis    Intracranial arachnoid cysts    Pes planovalgus    Pneumonia 09/26/2015   Premature ventricular contractions    Respiratory failure ([place])    Past Surgical History:  Past Surgical History:  Procedure Laterality Date   ESOPHAGOGASTRODUODENOSCOPY (EGD) WITH PROPOFOL N/A 09/14/2020   Procedure: ESOPHAGOGASTRODUODENOSCOPY (EGD) WITH PROPOFOL;  Surgeon: Jonathon Bellows, MD;  Location: [facility] ENDOSCOPY;  Service: Gastroenterology;  Laterality: N/A;   ESOPHAGOGASTRODUODENOSCOPY (EGD) WITH PROPOFOL N/A 10/11/2020   Procedure: ESOPHAGOGASTRODUODENOSCOPY (EGD) WITH PROPOFOL;  Surgeon: Jonathon Bellows, MD;  Location: [facility] ENDOSCOPY;  Service: Gastroenterology;  Laterality: N/A;  with dilation per Dr. Vicente Males   ESOPHAGOGASTRODUODENOSCOPY (EGD) WITH PROPOFOL N/A 11/06/2020   Procedure: ESOPHAGOGASTRODUODENOSCOPY (EGD) WITH PROPOFOL;  Surgeon: Jonathon Bellows, MD;  Location: [facility] ENDOSCOPY;  Service: Gastroenterology;  Laterality: N/A;   FOREIGN BODY REMOVAL N/A 03/08/2015   Procedure: FOREIGN BODY REMOVAL;  Surgeon: Josefine Class, MD;  Location: [facility] ENDOSCOPY;  Service: Endoscopy;  Laterality: N/A;   IMPLANTATION VAGAL NERVE STIMULATOR  2011   REPLACE / REVISE VAGAL NERVE STIMULATOR     HPI:  Per H&P, pt " is a 27 y.o. male with medical history significant of autism, nonverbal status, epilepsy, PVC, intracranial arachnoid cyst, thrombocytopenia, hyponatremia, esophageal stricture, dysphagia, aspiration pneumonia, who presents cough and SOB.     Per his parents at the  bedside, pt has cough and shortness of breath for more than 2 days.  He coughs up little yellow-colored sputum.  Does not seem to have chest pain.  Patient has fever, and chills.  Patient also has diarrhea in the past 2 days, approximately 5 times of watery diarrhea each day, no active nausea vomiting or abdominal pain per his mother. Pt is usually active, but he has been less interactive in the past 2 days.  No fall."    Assessment / Plan / Recommendation  Clinical Impression   Pt seen today for BSE. Pt laying in bed w/ father present upon ST arrival. Pt alert and cooperative t/o eval. Pt nonverbal at baseline. Father reported pt eats pureed diet w/ nectar thick liquids at baseline at home. NSG arrived several times t/o session to change wet sheets. Pt left sitting up in bed w/ bed alarm set and father/NSG present.  Of note: Pt w/ hx of aspiration pneumonia/dysphagia. Pt seen for BSE in 10/2021 and was recommended Dys 1 (purees) w/ nectar thick liquids at that time. Pt on RA: afebrile; WBC slightly elevated and trending down.  Pt appears to present w/ a functional oropharyngeal swallow given modified diet of Dys 1 (purees) and nectar thick liquids -- consistent w/ his diet at home. Pt at increased risk of aspiration/aspiration pneumonia w/ hx of chronic aspiration pneumonia and dysphagia. Following strict aspiration precautions can help reduce this risk.  Administered trials of nectar thick liquids via TSP and puree. Pt's father fed pt for majority of trials -- father reports he/his wife feed pt at home from TSP. Pt's oral phase appeared [facility] c/b timely A-P bolus transfer, good bolus control/management, and  clear oral cavity post-po's. Pt's pharyngeal phase appeared [facility] c/b seemingly timely pharyngeal swallow and clear vocal quality post-po's. No overt, consistent s/s of aspiration noted such as coughing, throat clearing, nor wet vocal quality.  Pt/father educated on General aspiration precautions and  diet recommendation. Father appreciative/agreed. Unsure of pt's full understanding of information.  Recommend Dys 1 (purees) diet w/ nectar thick liquids via TSP. Pt requires full assist w/ all po's. Follow strict aspiration/reflux precautions. Recommend full supervision at meals.  Recommend dietician consult d/t concern for inadequate nutritional intake/low BMI. Consider palliative consult. RN updated/agreed.  No acute ST needs at this time. ST services will s/o. MD to reconsult if any new needs arise during admission.  SLP Visit Diagnosis: Dysphagia, unspecified (R13.10)    Aspiration Risk  Moderate aspiration risk;Severe aspiration risk;Risk for inadequate nutrition/hydration    Diet Recommendation   Recommend Dys 1 (purees) diet w/ nectar thick liquids via TSP. Pt requires full assist w/ all po's. Follow strict aspiration/reflux precautions.   Medication Administration: Crushed with puree    Other  Recommendations Recommended Consults:  (Dietician, palliative) Oral Care Recommendations: Oral care BID;Oral care before and after PO    Recommendations for follow up therapy are one component of a multi-disciplinary discharge planning process, led by the attending physician.  Recommendations may be updated based on patient status, additional functional criteria and insurance authorization.  Follow up Recommendations No SLP follow up      Assistance Recommended at Discharge  Full  Functional Status Assessment Patient has had a recent decline in their functional status and demonstrates the ability to make significant improvements in function in a reasonable and predictable amount of time.  Frequency and Duration   NA         Prognosis        Swallow Study   General Date of Onset: 05/01/22 HPI: Per H&P, pt " is a 27 y.o. male with medical history significant of autism, nonverbal status, epilepsy, PVC, intracranial arachnoid cyst, thrombocytopenia, hyponatremia, esophageal stricture,  dysphagia, aspiration pneumonia, who presents cough and SOB.     Per his parents at the bedside, pt has cough and shortness of breath for more than 2 days.  He coughs up little yellow-colored sputum.  Does not seem to have chest pain.  Patient has fever, and chills.  Patient also has diarrhea in the past 2 days, approximately 5 times of watery diarrhea each day, no active nausea vomiting or abdominal pain per his mother. Pt is usually active, but he has been less interactive in the past 2 days.  No fall." Type of Study: Bedside Swallow Evaluation Previous Swallow Assessment: 10/2021 - recommend puree and nectar thick liquids at that time Diet Prior to this Study: Dysphagia 2 (finely chopped);Thin liquids (Level 0) Temperature Spikes Noted: No (WBC 11.1) Respiratory Status: Room air History of Recent Intubation: No Behavior/Cognition: Alert;Cooperative;Requires cueing (uncomfortable - d/t wet bedsheets) Oral Cavity Assessment: Within Functional Limits Oral Care Completed by SLP: No Oral Cavity - Dentition: Adequate natural dentition Self-Feeding Abilities: Total assist;Other (Comment) (at baseline total assist) Patient Positioning: Upright in bed Baseline Vocal Quality: Normal Volitional Swallow: Able to elicit    Oral/Motor/Sensory Function Overall Oral Motor/Sensory Function: Within functional limits   Ice Chips Ice chips: Not tested   Thin Liquid Thin Liquid: Not tested    Nectar Thick Nectar Thick Liquid: Within functional limits Presentation: Spoon (10+ trials)   Honey Thick Honey Thick Liquid: Not tested   Puree Puree: Within functional  limits Presentation: Spoon (6+ trials)   Solid     Solid: Not tested     Randall Hiss Graduate Clinician [place], Speech Pathology   Randall Hiss 05/02/2022,10:31 AM

## 2022-05-03 ENCOUNTER — Other Ambulatory Visit: Payer: Self-pay

## 2022-05-03 DIAGNOSIS — J690 Pneumonitis due to inhalation of food and vomit: Secondary | ICD-10-CM | POA: Diagnosis not present

## 2022-05-03 DIAGNOSIS — A0811 Acute gastroenteropathy due to Norwalk agent: Secondary | ICD-10-CM

## 2022-05-03 DIAGNOSIS — E43 Unspecified severe protein-calorie malnutrition: Secondary | ICD-10-CM | POA: Diagnosis not present

## 2022-05-03 LAB — BASIC METABOLIC PANEL
Anion gap: 6 (ref 5–15)
BUN: 18 mg/dL (ref 6–20)
CO2: 19 mmol/L — ABNORMAL LOW (ref 22–32)
Calcium: 8.2 mg/dL — ABNORMAL LOW (ref 8.9–10.3)
Chloride: 111 mmol/L (ref 98–111)
Creatinine, Ser: 0.36 mg/dL — ABNORMAL LOW (ref 0.61–1.24)
GFR, Estimated: >60 mL/min (ref >60)
Glucose, Bld: 77 mg/dL (ref 70–99)
Potassium: 3.9 mmol/L (ref 3.5–5.1)
Sodium: 136 mmol/L (ref 135–145)

## 2022-05-03 LAB — CBC
HCT: 31.5 % — ABNORMAL LOW (ref 39.0–52.0)
Hemoglobin: 11 g/dL — ABNORMAL LOW (ref 13.0–17.0)
MCH: 31.1 pg (ref 26.0–34.0)
MCHC: 34.9 g/dL (ref 30.0–36.0)
MCV: 89 fL (ref 80.0–100.0)
Platelets: 88 10*3/uL — ABNORMAL LOW (ref 150–400)
RBC: 3.54 MIL/uL — ABNORMAL LOW (ref 4.22–5.81)
RDW: 12.9 % (ref 11.5–15.5)
WBC: 8.2 10*3/uL (ref 4.0–10.5)
nRBC: 0 % (ref 0.0–0.2)

## 2022-05-03 MED ORDER — AMOXICILLIN-POT CLAVULANATE 875-125 MG PO TABS
1.0000 | ORAL_TABLET | Freq: Two times a day (BID) | ORAL | 0 refills | Status: AC
  Filled 2022-05-03: qty 10, 5d supply, fill #0

## 2022-05-03 NOTE — Plan of Care (Signed)

## 2022-05-03 NOTE — Progress Notes (Signed)
Discharge instructions reviewed with patient's parents. Both verbalized understanding of instructions.

## 2022-05-03 NOTE — TOC Transition Note (Signed)
Transition of Care [facility]) - CM/SW Discharge Note   Patient Details  Name: Noah Marshall MRN: [id number] Date of Birth: Nov 03, 1995  [facility]) CM/SW Contact:  Truddie Hidden, RN Phone Number: 05/03/2022, 11:53 AM   Clinical Narrative:    Patient discharging home.  Spoke with his mother to advise resources were added to AVS. She denied any other needs or concerns.   TOC signing off.          Patient Goals and CMS Choice      Discharge Placement                         Discharge Plan and Services Additional resources added to the After Visit Summary for Autism/IDD                                     Social Determinants of Health (SDOH) Interventions SDOH Screenings   Alcohol Screen: Low Risk  (08/03/2021)  Depression (PHQ2-9): Low Risk  (08/03/2021)  Tobacco Use: Low Risk  (11/23/2021)     Readmission Risk Interventions     No data to display

## 2022-05-03 NOTE — Discharge Summary (Signed)
Physician Discharge Summary  [name] ZOX:[id number] DOB: 05/25/1995 DOA: 05/01/2022  PCP: Malva Limes, MD  Admit date: 05/01/2022 Discharge date: 05/03/2022  Admitted From: home  Disposition:  home   Recommendations for Outpatient Follow-up:  Follow up with PCP in 1-2 weeks  Home Health: no  Equipment/Devices: N/A  Discharge Condition: stable CODE STATUS: full  Diet recommendation: Dysphagia/nectar thick liquids   Brief/Interim Summary: HPI was taken from Dr. Clyde Lundborg: Noah Marshall is a 27 y.o. male with medical history significant of autism, nonverbal status, epilepsy, PVC, intracranial arachnoid cyst, thrombocytopenia, hyponatremia, esophageal stricture, dysphagia, aspiration pneumonia, who presents cough and SOB.   Per his parents at the bedside, pt has cough and shortness of breath for more than 2 days.  He coughs up little yellow-colored sputum.  Does not seem to have chest pain.  Patient has fever, and chills.  Patient also has diarrhea in the past 2 days, approximately 5 times of watery diarrhea each day, no active nausea vomiting or abdominal pain per his mother. Pt is usually active, but he has been less interactive in the past 2 days.  No fall.   Data reviewed independently and ED Course: pt was found to have WBC 13.7, lactic acid 1.1 --> 2.2, platelet 103 (110 on 11/07/2021), GFR> 60, sodium 121. Temperature 101.1, blood pressure 101/64, heart rate 110, RR 22, oxygen saturation 94% on room air.  Chest x-ray showed right lower lobe infiltration.  Patient is admitted to telemetry bed as inpatient.    As per Dr. Mayford Knife 4/4-05/03/22: Pt was found to have aspiration pneumonia on admission and was started on IV unasyn. Pt has recurrent aspiration pneumonia likely due to hx of autism, esophageal stricture & dysphagia. Pt did not require supplemental oxygen while inpatient. Pt was d/c home po augmentin to complete the course.   Discharge Diagnoses:  Principal Problem:    Aspiration pneumonia Active Problems:   Severe sepsis   Acute metabolic encephalopathy   Hyponatremia   Autistic disorder   Seizure disorder   Thrombocytopenia   Diarrhea   Enteritis due to Norovirus   Protein-calorie malnutrition, severe  Severe sepsis: met criteria w/ leukocytosis, tachycardia, tachypnea, elevated lactic acid & aspiration pneumonia. Continue on IV unasyn, bronchodilators.  Continue w/ dysphagia I diet as per speech. Severe sepsis resolve    Acute metabolic encephalopathy: likely due to above infection. Back to baseline    Hyponatremia: resolved    Hypokalemia:  WNL today    Normocytic anemia: H&H are stable    Autistic disorder: continue w/ supportive care    Seizure disorder: continue on home dose of clobazam, klonopin, topamax, trilepta, levocarnitine, depakote. Seizure precautions    Thrombocytopenia: chronic and labile    Diarrhea: c. diff was neg. GI PCR panel was positive for norovirus   Severe protein calorie malnutrition: encourage po intake    Discharge Instructions  Discharge Instructions     Diet general   Complete by: As directed    Continue on dysphagia diet w/ nectar thick liquids   Discharge instructions   Complete by: As directed    F/u w/ PCP in 1-2 weeks   Increase activity slowly   Complete by: As directed       Allergies as of 05/03/2022       Reactions   Other Diarrhea, Hives, Itching   Dairy   Peanut-containing Drug Products Hives, Anaphylaxis   Lactose Intolerance (gi) Diarrhea        Medication List  TAKE these medications    amoxicillin-clavulanate 875-125 MG tablet Commonly known as: AUGMENTIN Take 1 tablet by mouth 2 (two) times daily for 5 days.   cetirizine HCl 1 MG/ML solution Commonly known as: ZYRTEC Take 15 mg by mouth daily.   cloBAZam 2.5 MG/ML solution Commonly known as: ONFI SHAKE WELL AND GIVE 6 ML BY MOUTH TWICE DAILY. Strength: 2.5 mg/mL   clonazePAM 0.5 MG tablet Commonly known as:  KLONOPIN Take 1 tablet (0.5 mg total) by mouth 3 (three) times daily   fluticasone 50 MCG/ACT nasal spray Commonly known as: FLONASE Place 2 sprays into both nostrils daily.   guanFACINE 1 MG tablet Commonly known as: TENEX Take 1 tablet (1 mg total) by mouth 2 (two) times daily. What changed: Another medication with the same name was removed. Continue taking this medication, and follow the directions you see here.   levOCARNitine 1 GM/10ML solution Commonly known as: CARNITOR Take 5 mLs (500 mg total) by mouth 2 (two) times daily   montelukast 10 MG tablet Commonly known as: SINGULAIR Take 10 mg by mouth at bedtime.   topiramate 200 MG tablet Commonly known as: TOPAMAX Take 1 tablet (200 mg total) by mouth 2 (two) times daily.   Trileptal 300 MG/5ML suspension Generic drug: OXcarbazepine TAKE 10 MLS BY MOUTH 2 TIMES DAILY Strength: 300 mg/5 mL (60 mg/mL)   valproic acid 250 MG/5ML solution Commonly known as: DEPAKENE Take 10 mLs (500 mg total) by mouth 2 (two) times daily        Allergies  Allergen Reactions   Other Diarrhea, Hives and Itching    Dairy   Peanut-Containing Drug Products Hives and Anaphylaxis   Lactose Intolerance (Gi) Diarrhea    Consultations:    Procedures/Studies: DG Chest 1 View  Result Date: 05/01/2022 CLINICAL DATA:  Cough possible aspiration EXAM: CHEST  1 VIEW COMPARISON:  X-ray dated December 19, 2021 FINDINGS: Cardiac and mediastinal contours are within normal limits. New right lower lobe consolidation. Evidence of pleural effusion evidence of pneumothorax. IMPRESSION: New right lower lobe consolidation, concerning for aspiration or infection. Electronically Signed   By: Allegra Lai M.D.   On: 05/01/2022 08:10   (Echo, Carotid, EGD, Colonoscopy, ERCP)    Subjective: Pt is nonverbal at baseline. Pt appears comfortable   Discharge Exam: Vitals:   05/03/22 0937 05/03/22 1041  BP: (!) 94/52 (!) 103/57  Pulse: 83 (!) 109  Resp:  18 15  Temp: (!) 97.5 F (36.4 C)   SpO2: 100% 90%   Vitals:   05/02/22 1502 05/02/22 2350 05/03/22 0937 05/03/22 1041  BP: 116/66 104/66 (!) 94/52 (!) 103/57  Pulse: 93 81 83 (!) 109  Resp: 17 18 18 15   Temp: 98.7 F (37.1 C) 99 F (37.2 C) (!) 97.5 F (36.4 C)   TempSrc:      SpO2: 99% 100% 100% 90%  Weight:        General: Pt is alert, awake, not in acute distress Cardiovascular: S1/S2 +, no rubs, no gallops Respiratory: diminished breath sounds b/l  Abdominal: Soft, NT, ND, bowel sounds + Extremities: no edema, no cyanosis    The results of significant diagnostics from this hospitalization (including imaging, microbiology, ancillary and laboratory) are listed below for reference.     Microbiology: Recent Results (from the past 240 hour(s))  SARS Coronavirus 2 by RT PCR (hospital order, performed in [facility] hospital lab) *cepheid single result test* Anterior Nasal Swab     Status: None  Collection Time: 05/01/22  8:41 AM   Specimen: Anterior Nasal Swab  Result Value Ref Range Status   SARS Coronavirus 2 by RT PCR NEGATIVE NEGATIVE Final    Comment: (NOTE) SARS-CoV-2 target nucleic acids are NOT DETECTED.  The SARS-CoV-2 RNA is generally detectable in upper and lower respiratory specimens during the acute phase of infection. The lowest concentration of SARS-CoV-2 viral copies this assay can detect is 250 copies / mL. A negative result does not preclude SARS-CoV-2 infection and should not be used as the sole basis for treatment or other patient management decisions.  A negative result may occur with improper specimen collection / handling, submission of specimen other than nasopharyngeal swab, presence of viral mutation(s) within the areas targeted by this assay, and inadequate number of viral copies (<250 copies / mL). A negative result must be combined with clinical observations, patient history, and epidemiological information.  Fact Sheet for Patients:    RoadLapTop.co.zahttps://www.fda.gov/media/158405/download  Fact Sheet for Healthcare Providers: http://kim-miller.com/https://www.fda.gov/media/158404/download  This test is not yet approved or  cleared by the [place] FDA and has been authorized for detection and/or diagnosis of SARS-CoV-2 by FDA under an Emergency Use Authorization (EUA).  This EUA will remain in effect (meaning this test can be used) for the duration of the COVID-19 declaration under Section 564(b)(1) of the Act, 21 U.S.C. section 360bbb-3(b)(1), unless the authorization is terminated or revoked sooner.  Performed at [facility], [address] [place], [place] [address]   Culture, blood (Routine x 2)     Status: Abnormal (Preliminary result)   Collection Time: 05/01/22 10:52 AM   Specimen: BLOOD  Result Value Ref Range Status   Specimen Description   Final    BLOOD BLOOD LEFT FOREARM Performed at [facility], [address] [place], [place] [address]    Special Requests   Final    BOTTLES DRAWN AEROBIC AND ANAEROBIC Blood Culture adequate volume Performed at [facility], [address] [place], [place] [address]    Culture  Setup Time   Final    GRAM POSITIVE COCCI ANAEROBIC BOTTLE ONLY CRITICAL RESULT CALLED TO, READ BACK BY AND VERIFIED WITH: NATHAN BLUE@0408  05/02/22 RH    Culture (A)  Final    STAPHYLOCOCCUS HAEMOLYTICUS THE SIGNIFICANCE OF ISOLATING THIS ORGANISM FROM A SINGLE SET OF BLOOD CULTURES WHEN MULTIPLE SETS ARE DRAWN IS UNCERTAIN. PLEASE NOTIFY THE MICROBIOLOGY DEPARTMENT WITHIN ONE WEEK IF SPECIATION AND SENSITIVITIES ARE REQUIRED. Performed at [facility], [address] [place], [place] [address]    Report Status PENDING  Incomplete  Blood Culture ID Panel (Reflexed)     Status: Abnormal   Collection Time: 05/01/22 10:52 AM  Result Value Ref Range Status   Enterococcus faecalis NOT DETECTED NOT DETECTED Final   Enterococcus Faecium NOT DETECTED NOT DETECTED Final   Listeria  monocytogenes NOT DETECTED NOT DETECTED Final   Staphylococcus species DETECTED (A) NOT DETECTED Final    Comment: CRITICAL RESULT CALLED TO, READ BACK BY AND VERIFIED WITH: NATHAN BLUE@0408  05/02/22 RH    Staphylococcus aureus (BCID) NOT DETECTED NOT DETECTED Final   Staphylococcus epidermidis NOT DETECTED NOT DETECTED Final   Staphylococcus lugdunensis NOT DETECTED NOT DETECTED Final   Streptococcus species NOT DETECTED NOT DETECTED Final   Streptococcus agalactiae NOT DETECTED NOT DETECTED Final   Streptococcus pneumoniae NOT DETECTED NOT DETECTED Final   Streptococcus pyogenes NOT DETECTED NOT DETECTED Final   A.calcoaceticus-baumannii NOT DETECTED NOT DETECTED Final  Bacteroides fragilis NOT DETECTED NOT DETECTED Final   Enterobacterales NOT DETECTED NOT DETECTED Final   Enterobacter cloacae complex NOT DETECTED NOT DETECTED Final   Escherichia coli NOT DETECTED NOT DETECTED Final   Klebsiella aerogenes NOT DETECTED NOT DETECTED Final   Klebsiella oxytoca NOT DETECTED NOT DETECTED Final   Klebsiella pneumoniae NOT DETECTED NOT DETECTED Final   Proteus species NOT DETECTED NOT DETECTED Final   Salmonella species NOT DETECTED NOT DETECTED Final   Serratia marcescens NOT DETECTED NOT DETECTED Final   Haemophilus influenzae NOT DETECTED NOT DETECTED Final   Neisseria meningitidis NOT DETECTED NOT DETECTED Final   Pseudomonas aeruginosa NOT DETECTED NOT DETECTED Final   Stenotrophomonas maltophilia NOT DETECTED NOT DETECTED Final   Candida albicans NOT DETECTED NOT DETECTED Final   Candida auris NOT DETECTED NOT DETECTED Final   Candida glabrata NOT DETECTED NOT DETECTED Final   Candida krusei NOT DETECTED NOT DETECTED Final   Candida parapsilosis NOT DETECTED NOT DETECTED Final   Candida tropicalis NOT DETECTED NOT DETECTED Final   Cryptococcus neoformans/gattii NOT DETECTED NOT DETECTED Final    Comment: Performed at [facility], [address] [place], [place]  [address]  C Difficile Quick Screen w PCR reflex     Status: None   Collection Time: 05/01/22  1:09 PM   Specimen: Stool  Result Value Ref Range Status   C Diff antigen NEGATIVE NEGATIVE Final   C Diff toxin NEGATIVE NEGATIVE Final   C Diff interpretation No C. difficile detected.  Final    Comment: Performed at [facility], [address] [place], [place] [address]  Gastrointestinal Panel by PCR , Stool     Status: Abnormal   Collection Time: 05/01/22  1:09 PM   Specimen: Stool  Result Value Ref Range Status   Campylobacter species NOT DETECTED NOT DETECTED Final   Plesimonas shigelloides NOT DETECTED NOT DETECTED Final   Salmonella species NOT DETECTED NOT DETECTED Final   Yersinia enterocolitica NOT DETECTED NOT DETECTED Final   Vibrio species NOT DETECTED NOT DETECTED Final   Vibrio cholerae NOT DETECTED NOT DETECTED Final   Enteroaggregative E coli (EAEC) NOT DETECTED NOT DETECTED Final   Enteropathogenic E coli (EPEC) NOT DETECTED NOT DETECTED Final   Enterotoxigenic E coli (ETEC) NOT DETECTED NOT DETECTED Final   Shiga like toxin producing E coli (STEC) NOT DETECTED NOT DETECTED Final   Shigella/Enteroinvasive E coli (EIEC) NOT DETECTED NOT DETECTED Final   Cryptosporidium NOT DETECTED NOT DETECTED Final   Cyclospora cayetanensis NOT DETECTED NOT DETECTED Final   Entamoeba histolytica NOT DETECTED NOT DETECTED Final   Giardia lamblia NOT DETECTED NOT DETECTED Final   Adenovirus F40/41 NOT DETECTED NOT DETECTED Final   Astrovirus NOT DETECTED NOT DETECTED Final   Norovirus GI/GII DETECTED (A) NOT DETECTED Final    Comment: RESULT CALLED TO, READ BACK BY AND VERIFIED WITH: EBONY KELLY AT 1444 ON 05/01/22 BY SS    Rotavirus A NOT DETECTED NOT DETECTED Final   Sapovirus (I, II, IV, and V) NOT DETECTED NOT DETECTED Final    Comment: Performed at [facility], [address] [place], [place] [address]  Culture, blood (Routine x 2)     Status: None (Preliminary  result)   Collection Time: 05/01/22  2:59 PM   Specimen: BLOOD  Result Value Ref Range Status   Specimen Description BLOOD RIGHT ANTECUBITAL  Final   Special Requests   Final    BOTTLES DRAWN AEROBIC AND ANAEROBIC  Blood Culture adequate volume   Culture   Final    NO GROWTH 2 DAYS Performed at [facility], [address] [place], [place] [address]    Report Status PENDING  Incomplete     Labs: BNP (last 3 results) No results for input(s): "BNP" in the last 8760 hours. Basic Metabolic Panel: Recent Labs  Lab 05/01/22 0747 05/01/22 1928 05/02/22 0323 05/02/22 1257 05/03/22 0435  NA 121* 130* 131* 134* 136  K 4.0 3.8 3.2* 3.4* 3.9  CL 92* 101 104 109 111  CO2 21* 19* 20* 17* 19*  GLUCOSE 97 96 85 84 77  BUN 14 10 12 15 18   CREATININE 0.41* <0.30* 0.37* 0.40* 0.36*  CALCIUM 8.5* 8.3* 8.2* 8.3* 8.2*   Liver Function Tests: Recent Labs  Lab 05/01/22 0747  AST 17  ALT 8  ALKPHOS 76  BILITOT 0.6  PROT 7.1  ALBUMIN 3.8   No results for input(s): "LIPASE", "AMYLASE" in the last 168 hours. No results for input(s): "AMMONIA" in the last 168 hours. CBC: Recent Labs  Lab 05/01/22 0747 05/02/22 0323 05/03/22 0435  WBC 13.7* 11.1* 8.2  NEUTROABS 11.9*  --   --   HGB 12.5* 11.4* 11.0*  HCT 35.9* 32.1* 31.5*  MCV 88.2 87.0 89.0  PLT 103* 94* 88*   Cardiac Enzymes: No results for input(s): "CKTOTAL", "CKMB", "CKMBINDEX", "TROPONINI" in the last 168 hours. BNP: Invalid input(s): "POCBNP" CBG: No results for input(s): "GLUCAP" in the last 168 hours. D-Dimer No results for input(s): "DDIMER" in the last 72 hours. Hgb A1c No results for input(s): "HGBA1C" in the last 72 hours. Lipid Profile No results for input(s): "CHOL", "HDL", "LDLCALC", "TRIG", "CHOLHDL", "LDLDIRECT" in the last 72 hours. Thyroid function studies No results for input(s): "TSH", "T4TOTAL", "T3FREE", "THYROIDAB" in the last 72 hours.  Invalid input(s): "FREET3" Anemia work up No  results for input(s): "VITAMINB12", "FOLATE", "FERRITIN", "TIBC", "IRON", "RETICCTPCT" in the last 72 hours. Urinalysis    Component Value Date/Time   COLORURINE YELLOW (A) 11/06/2021 1959   APPEARANCEUR HAZY (A) 11/06/2021 1959   APPEARANCEUR Clear 12/27/2012 0012   LABSPEC 1.012 11/06/2021 1959   LABSPEC 1.017 12/27/2012 0012   PHURINE 7.0 11/06/2021 1959   GLUCOSEU NEGATIVE 11/06/2021 1959   GLUCOSEU Negative 12/27/2012 0012   HGBUR NEGATIVE 11/06/2021 1959   BILIRUBINUR NEGATIVE 11/06/2021 1959   BILIRUBINUR Negative 12/27/2012 0012   KETONESUR NEGATIVE 11/06/2021 1959   PROTEINUR NEGATIVE 11/06/2021 1959   NITRITE NEGATIVE 11/06/2021 1959   LEUKOCYTESUR NEGATIVE 11/06/2021 1959   LEUKOCYTESUR Negative 12/27/2012 0012   Sepsis Labs Recent Labs  Lab 05/01/22 0747 05/02/22 0323 05/03/22 0435  WBC 13.7* 11.1* 8.2   Microbiology Recent Results (from the past 240 hour(s))  SARS Coronavirus 2 by RT PCR (hospital order, performed in [facility] hospital lab) *cepheid single result test* Anterior Nasal Swab     Status: None   Collection Time: 05/01/22  8:41 AM   Specimen: Anterior Nasal Swab  Result Value Ref Range Status   SARS Coronavirus 2 by RT PCR NEGATIVE NEGATIVE Final    Comment: (NOTE) SARS-CoV-2 target nucleic acids are NOT DETECTED.  The SARS-CoV-2 RNA is generally detectable in upper and lower respiratory specimens during the acute phase of infection. The lowest concentration of SARS-CoV-2 viral copies this assay can detect is 250 copies / mL. A negative result does not preclude SARS-CoV-2 infection and should not be used as the sole basis for treatment or other patient management  decisions.  A negative result may occur with improper specimen collection / handling, submission of specimen other than nasopharyngeal swab, presence of viral mutation(s) within the areas targeted by this assay, and inadequate number of viral copies (<250 copies / mL). A negative  result must be combined with clinical observations, patient history, and epidemiological information.  Fact Sheet for Patients:   RoadLapTop.co.za  Fact Sheet for Healthcare Providers: http://kim-miller.com/  This test is not yet approved or  cleared by the [place] FDA and has been authorized for detection and/or diagnosis of SARS-CoV-2 by FDA under an Emergency Use Authorization (EUA).  This EUA will remain in effect (meaning this test can be used) for the duration of the COVID-19 declaration under Section 564(b)(1) of the Act, 21 U.S.C. section 360bbb-3(b)(1), unless the authorization is terminated or revoked sooner.  Performed at [facility], [address] [place], [place] [address]   Culture, blood (Routine x 2)     Status: Abnormal (Preliminary result)   Collection Time: 05/01/22 10:52 AM   Specimen: BLOOD  Result Value Ref Range Status   Specimen Description   Final    BLOOD BLOOD LEFT FOREARM Performed at [facility], [address], [place], [place] [address]    Special Requests   Final    BOTTLES DRAWN AEROBIC AND ANAEROBIC Blood Culture adequate volume Performed at [facility], [address] [place], [place] [address]    Culture  Setup Time   Final    GRAM POSITIVE COCCI ANAEROBIC BOTTLE ONLY CRITICAL RESULT CALLED TO, READ BACK BY AND VERIFIED WITH: NATHAN BLUE@0408  05/02/22 RH    Culture (A)  Final    STAPHYLOCOCCUS HAEMOLYTICUS THE SIGNIFICANCE OF ISOLATING THIS ORGANISM FROM A SINGLE SET OF BLOOD CULTURES WHEN MULTIPLE SETS ARE DRAWN IS UNCERTAIN. PLEASE NOTIFY THE MICROBIOLOGY DEPARTMENT WITHIN ONE WEEK IF SPECIATION AND SENSITIVITIES ARE REQUIRED. Performed at [facility], [address], [place], [place] [address]    Report Status PENDING  Incomplete  Blood Culture ID Panel (Reflexed)     Status: Abnormal   Collection Time: 05/01/22 10:52 AM  Result Value Ref Range  Status   Enterococcus faecalis NOT DETECTED NOT DETECTED Final   Enterococcus Faecium NOT DETECTED NOT DETECTED Final   Listeria monocytogenes NOT DETECTED NOT DETECTED Final   Staphylococcus species DETECTED (A) NOT DETECTED Final    Comment: CRITICAL RESULT CALLED TO, READ BACK BY AND VERIFIED WITH: NATHAN BLUE@0408  05/02/22 RH    Staphylococcus aureus (BCID) NOT DETECTED NOT DETECTED Final   Staphylococcus epidermidis NOT DETECTED NOT DETECTED Final   Staphylococcus lugdunensis NOT DETECTED NOT DETECTED Final   Streptococcus species NOT DETECTED NOT DETECTED Final   Streptococcus agalactiae NOT DETECTED NOT DETECTED Final   Streptococcus pneumoniae NOT DETECTED NOT DETECTED Final   Streptococcus pyogenes NOT DETECTED NOT DETECTED Final   A.calcoaceticus-baumannii NOT DETECTED NOT DETECTED Final   Bacteroides fragilis NOT DETECTED NOT DETECTED Final   Enterobacterales NOT DETECTED NOT DETECTED Final   Enterobacter cloacae complex NOT DETECTED NOT DETECTED Final   Escherichia coli NOT DETECTED NOT DETECTED Final   Klebsiella aerogenes NOT DETECTED NOT DETECTED Final   Klebsiella oxytoca NOT DETECTED NOT DETECTED Final   Klebsiella pneumoniae NOT DETECTED NOT DETECTED Final   Proteus species NOT DETECTED NOT DETECTED Final   Salmonella species NOT DETECTED NOT DETECTED Final   Serratia marcescens NOT DETECTED NOT DETECTED Final   Haemophilus influenzae NOT DETECTED NOT DETECTED Final  Neisseria meningitidis NOT DETECTED NOT DETECTED Final   Pseudomonas aeruginosa NOT DETECTED NOT DETECTED Final   Stenotrophomonas maltophilia NOT DETECTED NOT DETECTED Final   Candida albicans NOT DETECTED NOT DETECTED Final   Candida auris NOT DETECTED NOT DETECTED Final   Candida glabrata NOT DETECTED NOT DETECTED Final   Candida krusei NOT DETECTED NOT DETECTED Final   Candida parapsilosis NOT DETECTED NOT DETECTED Final   Candida tropicalis NOT DETECTED NOT DETECTED Final   Cryptococcus  neoformans/gattii NOT DETECTED NOT DETECTED Final    Comment: Performed at [facility], [address] [place], [place] [address]  C Difficile Quick Screen w PCR reflex     Status: None   Collection Time: 05/01/22  1:09 PM   Specimen: Stool  Result Value Ref Range Status   C Diff antigen NEGATIVE NEGATIVE Final   C Diff toxin NEGATIVE NEGATIVE Final   C Diff interpretation No C. difficile detected.  Final    Comment: Performed at [facility], [address] [place], [place] [address]  Gastrointestinal Panel by PCR , Stool     Status: Abnormal   Collection Time: 05/01/22  1:09 PM   Specimen: Stool  Result Value Ref Range Status   Campylobacter species NOT DETECTED NOT DETECTED Final   Plesimonas shigelloides NOT DETECTED NOT DETECTED Final   Salmonella species NOT DETECTED NOT DETECTED Final   Yersinia enterocolitica NOT DETECTED NOT DETECTED Final   Vibrio species NOT DETECTED NOT DETECTED Final   Vibrio cholerae NOT DETECTED NOT DETECTED Final   Enteroaggregative E coli (EAEC) NOT DETECTED NOT DETECTED Final   Enteropathogenic E coli (EPEC) NOT DETECTED NOT DETECTED Final   Enterotoxigenic E coli (ETEC) NOT DETECTED NOT DETECTED Final   Shiga like toxin producing E coli (STEC) NOT DETECTED NOT DETECTED Final   Shigella/Enteroinvasive E coli (EIEC) NOT DETECTED NOT DETECTED Final   Cryptosporidium NOT DETECTED NOT DETECTED Final   Cyclospora cayetanensis NOT DETECTED NOT DETECTED Final   Entamoeba histolytica NOT DETECTED NOT DETECTED Final   Giardia lamblia NOT DETECTED NOT DETECTED Final   Adenovirus F40/41 NOT DETECTED NOT DETECTED Final   Astrovirus NOT DETECTED NOT DETECTED Final   Norovirus GI/GII DETECTED (A) NOT DETECTED Final    Comment: RESULT CALLED TO, READ BACK BY AND VERIFIED WITH: EBONY KELLY AT 1444 ON 05/01/22 BY SS    Rotavirus A NOT DETECTED NOT DETECTED Final   Sapovirus (I, II, IV, and V) NOT DETECTED NOT DETECTED Final    Comment:  Performed at [facility], [address] [place], [place] [address]  Culture, blood (Routine x 2)     Status: None (Preliminary result)   Collection Time: 05/01/22  2:59 PM   Specimen: BLOOD  Result Value Ref Range Status   Specimen Description BLOOD RIGHT ANTECUBITAL  Final   Special Requests   Final    BOTTLES DRAWN AEROBIC AND ANAEROBIC Blood Culture adequate volume   Culture   Final    NO GROWTH 2 DAYS Performed at [facility], [address] [address] [place], [place] [id number]    Report Status PENDING  Incomplete     Time coordinating discharge: Over 30 minutes  SIGNED:   Charise KillianJamiese M Geral Tuch, MD  [facility] 05/03/2022, 11:45 AM Pager   If 7PM-7AM, please contact night-coverage [email or web address]

## 2022-05-03 NOTE — Consult Note (Signed)
Pharmacy Antibiotic Note  Noah Marshall is a 27 y.o. male admitted on 05/01/2022 with aspiration pneumonia.  Pharmacy has been consulted for Unasyn dosing.  Plan: Continue Unasyn 3 grams IV every 6 hours Follow renal function for adjustments  Weight: 50 kg (110 lb 3.7 oz)  Temp (24hrs), Avg:98.8 F (37.1 C), Min:98.7 F (37.1 C), Max:99 F (37.2 C)  Recent Labs  Lab 05/01/22 0747 05/01/22 1051 05/01/22 1459 05/01/22 1742 05/01/22 1928 05/02/22 0323 05/02/22 1006 05/02/22 1257 05/03/22 0435  WBC 13.7*  --   --   --   --  11.1*  --   --  8.2  CREATININE 0.41*  --   --   --  <0.30* 0.37*  --  0.40* 0.36*  LATICACIDVEN 1.1 2.2* 3.0* 2.1*  --   --  0.8  --   --      Estimated Creatinine Clearance: 99 mL/min (A) (by C-G formula based on SCr of 0.36 mg/dL (L)).    Allergies  Allergen Reactions   Other Diarrhea, Hives and Itching    Dairy   Peanut-Containing Drug Products Hives and Anaphylaxis   Lactose Intolerance (Gi) Diarrhea    Antimicrobials this admission: Unasyn 4/3 >>   Dose adjustments this admission: N/A  Microbiology results: 4/3 BCx: 1/4 GPC, BCID = staph species 4/3 Sputum: ordered    Thank you for allowing pharmacy to be a part of this patient's care.  Barrie Folk, PharmD 05/03/2022 7:37 AM

## 2022-05-04 LAB — CULTURE, BLOOD (ROUTINE X 2): Special Requests: ADEQUATE

## 2022-05-06 ENCOUNTER — Other Ambulatory Visit: Payer: Self-pay

## 2022-05-06 ENCOUNTER — Telehealth: Payer: Self-pay

## 2022-05-06 LAB — CULTURE, BLOOD (ROUTINE X 2)
Culture: NO GROWTH
Special Requests: ADEQUATE

## 2022-05-06 NOTE — Transitions of Care (Post Inpatient/ED Visit) (Unsigned)
   05/06/2022  Name: Noah Marshall MRN: [id number] DOB: 09-Sep-1995  Today's TOC FU Call Status: Today's TOC FU Call Status:: Unsuccessul Call (1st Attempt) Unsuccessful Call (1st Attempt) Date: 05/06/22  Attempted to reach the patient regarding the most recent Inpatient/ED visit.  Follow Up Plan: Additional outreach attempts will be made to reach the patient to complete the Transitions of Care (Post Inpatient/ED visit) call.   Signature Karena Addison, LPN [facility] Nurse Health Advisor Direct Dial [phone]

## 2022-05-07 NOTE — Transitions of Care (Post Inpatient/ED Visit) (Unsigned)
   05/07/2022  Name: Noah Marshall MRN: [id number] DOB: [birth year]  Today's TOC FU Call Status: Today's TOC FU Call Status:: Unsuccessful Call (2nd Attempt) Unsuccessful Call (1st Attempt) Date: 05/06/22 Unsuccessful Call (2nd Attempt) Date: 05/07/22  Attempted to reach the patient regarding the most recent Inpatient/ED visit.  Follow Up Plan: Additional outreach attempts will be made to reach the patient to complete the Transitions of Care (Post Inpatient/ED visit) call.   Signature Karena Addison, LPN [facility] Nurse Health Advisor Direct Dial [phone]

## 2022-05-08 NOTE — Transitions of Care (Post Inpatient/ED Visit) (Signed)
   05/08/2022  Name: Noah Marshall MRN: [id number] DOB: [birth year]  Today's TOC FU Call Status: Today's TOC FU Call Status:: Successful TOC FU Call Competed Unsuccessful Call (1st Attempt) Date: 05/06/22 Unsuccessful Call (2nd Attempt) Date: 05/07/22 [facility] FU Call Complete Date: 05/08/22  Transition Care Management Follow-up Telephone Call Date of Discharge: 05/03/22 Discharge Facility: [facility]) Type of Discharge: Inpatient Admission Primary Inpatient Discharge Diagnosis:: pneumonia How have you been since you were released from the hospital?: Better Any questions or concerns?: No  Items Reviewed: Did you receive and understand the discharge instructions provided?: Yes Medications obtained and verified?: Yes (Medications Reviewed) Any new allergies since your discharge?: No Dietary orders reviewed?: Yes Do you have support at home?: Yes People in Home: parent(s)  Home Care and Equipment/Supplies: Were Home Health Services Ordered?: NA Any new equipment or medical supplies ordered?: NA  Functional Questionnaire: Do you need assistance with bathing/showering or dressing?: No Do you need assistance with meal preparation?: No Do you need assistance with eating?: No Do you have difficulty maintaining continence: No Do you need assistance with getting out of bed/getting out of a chair/moving?: No Do you have difficulty managing or taking your medications?: No  Follow up appointments reviewed: PCP Follow-up appointment confirmed?: Yes Date of PCP follow-up appointment?: 05/20/22 Follow-up Provider: Dr Sherrie Mustache [facility] Follow-up appointment confirmed?: NA Do you need transportation to your follow-up appointment?: No Do you understand care options if your condition(s) worsen?: Yes-patient verbalized understanding    SIGNATURE Karena Addison, LPN [facility] Nurse Health Advisor Direct Dial [phone]

## 2022-05-20 ENCOUNTER — Ambulatory Visit (INDEPENDENT_AMBULATORY_CARE_PROVIDER_SITE_OTHER): Payer: Commercial Managed Care - PPO | Admitting: Family Medicine

## 2022-05-20 ENCOUNTER — Encounter: Payer: Self-pay | Admitting: Family Medicine

## 2022-05-20 VITALS — BP 108/60 | HR 82 | Ht 65.0 in | Wt 109.4 lb

## 2022-05-20 DIAGNOSIS — J690 Pneumonitis due to inhalation of food and vomit: Secondary | ICD-10-CM

## 2022-05-20 DIAGNOSIS — F840 Autistic disorder: Secondary | ICD-10-CM

## 2022-05-20 NOTE — Progress Notes (Signed)
I,Sha'taria Tyson,acting as a Neurosurgeon for Mila Merry, MD.,have documented all relevant documentation on the behalf of Mila Merry, MD,as directed by  Mila Merry, MD   Established patient visit   Patient: Noah Marshall   DOB: Apr 27, 1995   27 y.o. Male  MRN: [id number] Visit Date: 05/20/2022  Today's healthcare provider: Mila Merry, MD   Chief Complaint  Patient presents with   Hospitalization Follow-up   Subjective    HPI  Follow up Hospitalization  Patient was admitted to [facility] on 05/01/22 and discharged on 05/03/22. He was treated for aspiration pneumonia. Treatment for this included the patient is on the cardiac monitor to evaluate for evidence of arrhythmia and/or significant heart rate changes. Covered with IV antibiotics ceftraxione and azithromycin as well as 30 cc/kg fluid bolus for sepsis. Also given rectal tylenol as well and tried to get him on 2 Liters nasal cannula but the patient did not tolerating taking it off frequently, so O2 monitored. Pt was d/c home po augmentin to complete the course.  Telephone follow up was done on 05/06/22 He reports excellent compliance with treatment. He reports this condition is resolved.  ----------------------------------------------------------------------------------------- -   Medications: Outpatient Medications Prior to Visit  Medication Sig   cetirizine HCl (ZYRTEC) 1 MG/ML solution Take 15 mg by mouth daily.   cloBAZam (ONFI) 2.5 MG/ML solution SHAKE WELL AND GIVE 6 ML BY MOUTH TWICE DAILY. Strength: 2.5 mg/mL   clonazePAM (KLONOPIN) 0.5 MG tablet Take 1 tablet (0.5 mg total) by mouth 3 (three) times daily   fluticasone (FLONASE) 50 MCG/ACT nasal spray Place 2 sprays into both nostrils daily.   guanFACINE (TENEX) 1 MG tablet Take 1 tablet (1 mg total) by mouth 2 (two) times daily.   levOCARNitine (CARNITOR) 1 GM/10ML solution Take 5 mLs (500 mg total) by mouth 2 (two) times daily   montelukast (SINGULAIR) 10 MG  tablet Take 10 mg by mouth at bedtime.   topiramate (TOPAMAX) 200 MG tablet Take 1 tablet (200 mg total) by mouth 2 (two) times daily.   TRILEPTAL 300 MG/5ML suspension TAKE 10 MLS BY MOUTH 2 TIMES DAILY Strength: 300 mg/5 mL (60 mg/mL)   valproic acid (DEPAKENE) 250 MG/5ML solution Take 10 mLs (500 mg total) by mouth 2 (two) times daily   No facility-administered medications prior to visit.    Review of Systems  Constitutional:  Negative for appetite change, chills and fever.  Respiratory:  Negative for chest tightness, shortness of breath and wheezing.   Cardiovascular:  Negative for chest pain and palpitations.  Gastrointestinal:  Negative for abdominal pain, nausea and vomiting.       Objective    BP 108/60 (BP Location: Left Arm, Patient Position: Sitting, Cuff Size: Normal)   Pulse 82   Ht 5\' 5"  (1.651 m)   Wt 109 lb 6.4 oz (49.6 kg)   SpO2 100%   BMI 18.21 kg/m    Physical Exam   General: Appearance:    Thin male in no acute distress  Eyes:    PERRL, conjunctiva/corneas clear, EOM's intact       Lungs:     Clear to auscultation bilaterally, respirations unlabored  Heart:    Normal heart rate. Normal rhythm. No murmurs, rubs, or gallops.    MS:   All extremities are intact.    Neurologic:   Awake, alert. Generalized hypertonia. Baseline neurologic exam.          Assessment & Plan  1. Aspiration pneumonia, unspecified aspiration pneumonia type, unspecified laterality, unspecified part of lung (HCC) Back to baseline, no sign of persistent infection. Continue to work with [facility]  2. Autistic disorder Stable, at baseline.       The entirety of the information documented in the History of Present Illness, Review of Systems and Physical Exam were personally obtained by me. Portions of this information were initially documented by the CMA and reviewed by me for thoroughness and accuracy.     Mila Merry, MD  [facility] [phone]  (phone) [phone] (fax)  [facility]

## 2022-05-30 ENCOUNTER — Other Ambulatory Visit: Payer: Self-pay

## 2022-06-04 ENCOUNTER — Other Ambulatory Visit: Payer: Self-pay

## 2022-06-17 ENCOUNTER — Other Ambulatory Visit: Payer: Self-pay

## 2022-06-25 ENCOUNTER — Other Ambulatory Visit: Payer: Self-pay

## 2022-06-26 ENCOUNTER — Other Ambulatory Visit: Payer: Self-pay

## 2022-06-26 MED ORDER — TRILEPTAL 300 MG/5ML PO SUSP
600.0000 mg | Freq: Two times a day (BID) | ORAL | 11 refills | Status: DC
  Filled 2022-06-26: qty 600, 30d supply, fill #0

## 2022-06-26 MED ORDER — TRILEPTAL 300 MG/5ML PO SUSP
600.0000 mg | Freq: Two times a day (BID) | ORAL | 11 refills | Status: DC
  Filled 2022-06-26: qty 500, 25d supply, fill #0
  Filled 2022-07-22: qty 500, 25d supply, fill #1
  Filled 2022-08-12: qty 500, 25d supply, fill #2
  Filled 2022-09-09: qty 500, 25d supply, fill #3
  Filled 2022-10-06: qty 500, 25d supply, fill #4
  Filled 2022-10-27: qty 500, 25d supply, fill #5
  Filled 2022-11-25: qty 500, 25d supply, fill #6
  Filled 2022-12-20: qty 500, 25d supply, fill #7
  Filled 2023-01-16: qty 500, 25d supply, fill #8
  Filled 2023-02-04: qty 500, 25d supply, fill #9
  Filled 2023-03-04: qty 500, 25d supply, fill #10
  Filled 2023-03-30: qty 500, 25d supply, fill #11
  Filled 2023-04-21: qty 500, 25d supply, fill #12
  Filled 2023-05-21: qty 500, 25d supply, fill #13
  Filled 2023-06-15: qty 500, 25d supply, fill #14

## 2022-07-08 ENCOUNTER — Other Ambulatory Visit: Payer: Self-pay

## 2022-07-16 ENCOUNTER — Other Ambulatory Visit: Payer: Self-pay

## 2022-07-16 DIAGNOSIS — R569 Unspecified convulsions: Secondary | ICD-10-CM | POA: Diagnosis not present

## 2022-07-16 DIAGNOSIS — G40909 Epilepsy, unspecified, not intractable, without status epilepticus: Secondary | ICD-10-CM | POA: Diagnosis not present

## 2022-07-16 DIAGNOSIS — G40901 Epilepsy, unspecified, not intractable, with status epilepticus: Secondary | ICD-10-CM | POA: Diagnosis not present

## 2022-07-16 DIAGNOSIS — G40419 Other generalized epilepsy and epileptic syndromes, intractable, without status epilepticus: Secondary | ICD-10-CM | POA: Diagnosis not present

## 2022-07-16 MED ORDER — CLOBAZAM 2.5 MG/ML PO SUSP
15.0000 mg | Freq: Two times a day (BID) | ORAL | 5 refills | Status: DC
  Filled 2022-07-16: qty 360, 30d supply, fill #0
  Filled 2022-08-28: qty 360, 30d supply, fill #1
  Filled 2022-09-23: qty 360, 30d supply, fill #2
  Filled 2022-10-21: qty 120, 10d supply, fill #3
  Filled 2022-10-22: qty 240, 20d supply, fill #3
  Filled 2022-11-21: qty 360, 30d supply, fill #4
  Filled 2022-12-20: qty 360, 30d supply, fill #5

## 2022-07-16 MED ORDER — VALPROIC ACID 250 MG/5ML PO SOLN
500.0000 mg | Freq: Two times a day (BID) | ORAL | 11 refills | Status: DC
  Filled 2022-07-16 – 2022-08-11 (×2): qty 600, 30d supply, fill #0
  Filled 2022-09-09: qty 600, 30d supply, fill #1
  Filled 2022-10-04: qty 600, 30d supply, fill #2
  Filled 2022-11-06 – 2022-11-07 (×2): qty 600, 30d supply, fill #3
  Filled 2022-12-02: qty 600, 30d supply, fill #4
  Filled 2022-12-31: qty 600, 30d supply, fill #5
  Filled 2023-02-04: qty 600, 30d supply, fill #6
  Filled 2023-03-04: qty 600, 30d supply, fill #7
  Filled 2023-03-30: qty 600, 30d supply, fill #8
  Filled 2023-04-29: qty 600, 30d supply, fill #9
  Filled 2023-05-25: qty 600, 30d supply, fill #10
  Filled 2023-07-06: qty 600, 30d supply, fill #11

## 2022-07-16 MED ORDER — TOPIRAMATE 200 MG PO TABS
200.0000 mg | ORAL_TABLET | Freq: Two times a day (BID) | ORAL | 3 refills | Status: DC
  Filled 2022-07-16 – 2022-12-03 (×2): qty 180, 90d supply, fill #0
  Filled 2023-02-25: qty 180, 90d supply, fill #1
  Filled 2023-05-25: qty 180, 90d supply, fill #2

## 2022-07-16 MED ORDER — CLONAZEPAM 0.5 MG PO TABS
0.5000 mg | ORAL_TABLET | Freq: Three times a day (TID) | ORAL | 5 refills | Status: DC
  Filled 2022-07-16: qty 90, 30d supply, fill #0

## 2022-07-16 MED ORDER — LEVOCARNITINE 1 GM/10ML PO SOLN
500.0000 mg | Freq: Two times a day (BID) | ORAL | 11 refills | Status: DC
  Filled 2022-07-16: qty 300, 30d supply, fill #0
  Filled 2022-12-02: qty 300, 30d supply, fill #1
  Filled 2023-02-25 – 2023-02-26 (×2): qty 300, 30d supply, fill #2
  Filled 2023-05-25: qty 300, 30d supply, fill #3

## 2022-07-22 ENCOUNTER — Other Ambulatory Visit: Payer: Self-pay

## 2022-08-11 ENCOUNTER — Other Ambulatory Visit: Payer: Self-pay

## 2022-08-12 ENCOUNTER — Other Ambulatory Visit: Payer: Self-pay

## 2022-08-28 ENCOUNTER — Other Ambulatory Visit: Payer: Self-pay

## 2022-08-29 ENCOUNTER — Other Ambulatory Visit: Payer: Self-pay

## 2022-09-09 ENCOUNTER — Other Ambulatory Visit: Payer: Self-pay

## 2022-09-23 ENCOUNTER — Other Ambulatory Visit: Payer: Self-pay

## 2022-10-04 ENCOUNTER — Other Ambulatory Visit: Payer: Self-pay

## 2022-10-06 ENCOUNTER — Other Ambulatory Visit: Payer: Self-pay

## 2022-10-07 ENCOUNTER — Other Ambulatory Visit: Payer: Self-pay

## 2022-10-07 MED ORDER — CLONAZEPAM 0.5 MG PO TABS
0.5000 mg | ORAL_TABLET | Freq: Three times a day (TID) | ORAL | 5 refills | Status: DC
  Filled 2022-10-07: qty 90, 30d supply, fill #0
  Filled 2022-12-02: qty 90, 30d supply, fill #1
  Filled 2023-01-26: qty 90, 30d supply, fill #2
  Filled 2023-02-25: qty 90, 30d supply, fill #3
  Filled 2023-04-03: qty 90, 30d supply, fill #4

## 2022-10-13 IMAGING — CR DG CHEST 2V
1 series · 2 of 2 positions shown · non-contrast
Comparison: Chest x-ray dated April 22, 2020.

CLINICAL DATA: Aspiration.

EXAM:
CHEST - 2 VIEW

[Series 1: dg chest 2 view · 0.14mm/px · 2 of 2 slices shown]
[im 1/2]
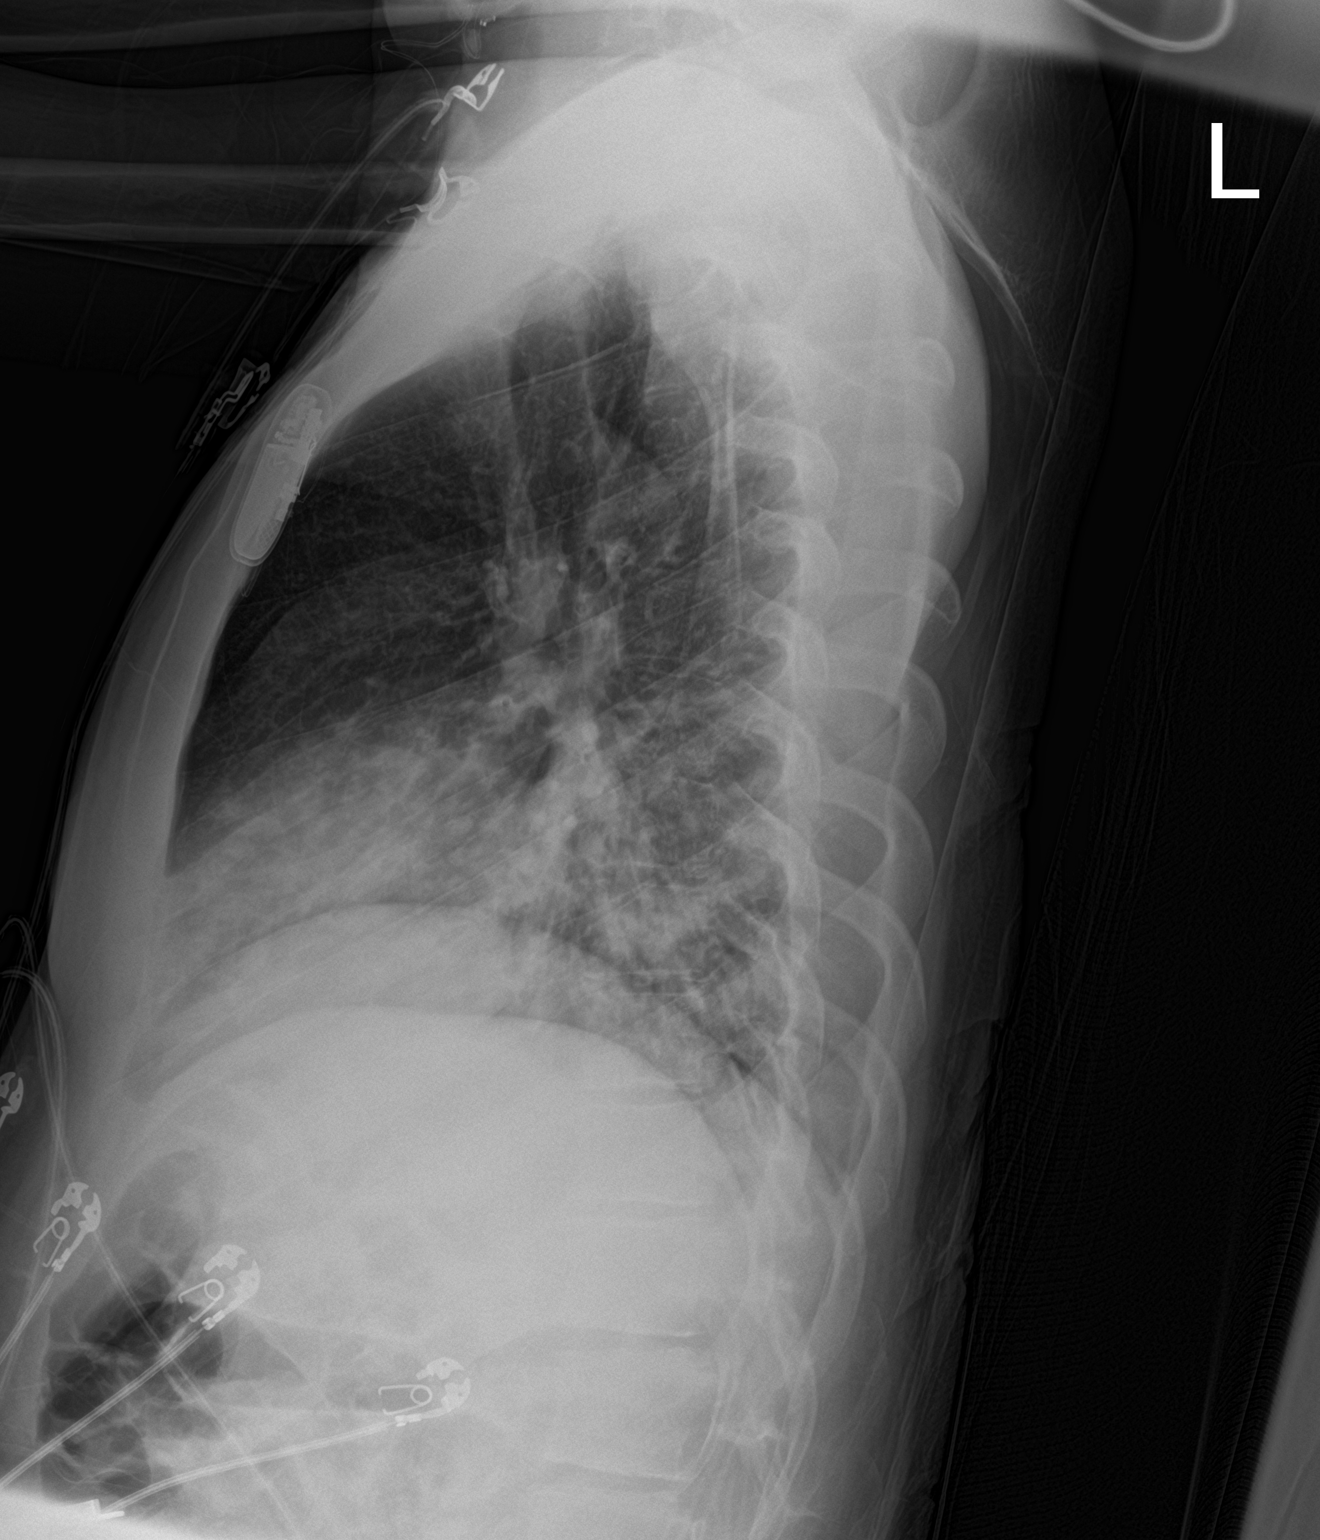
[im 2/2]
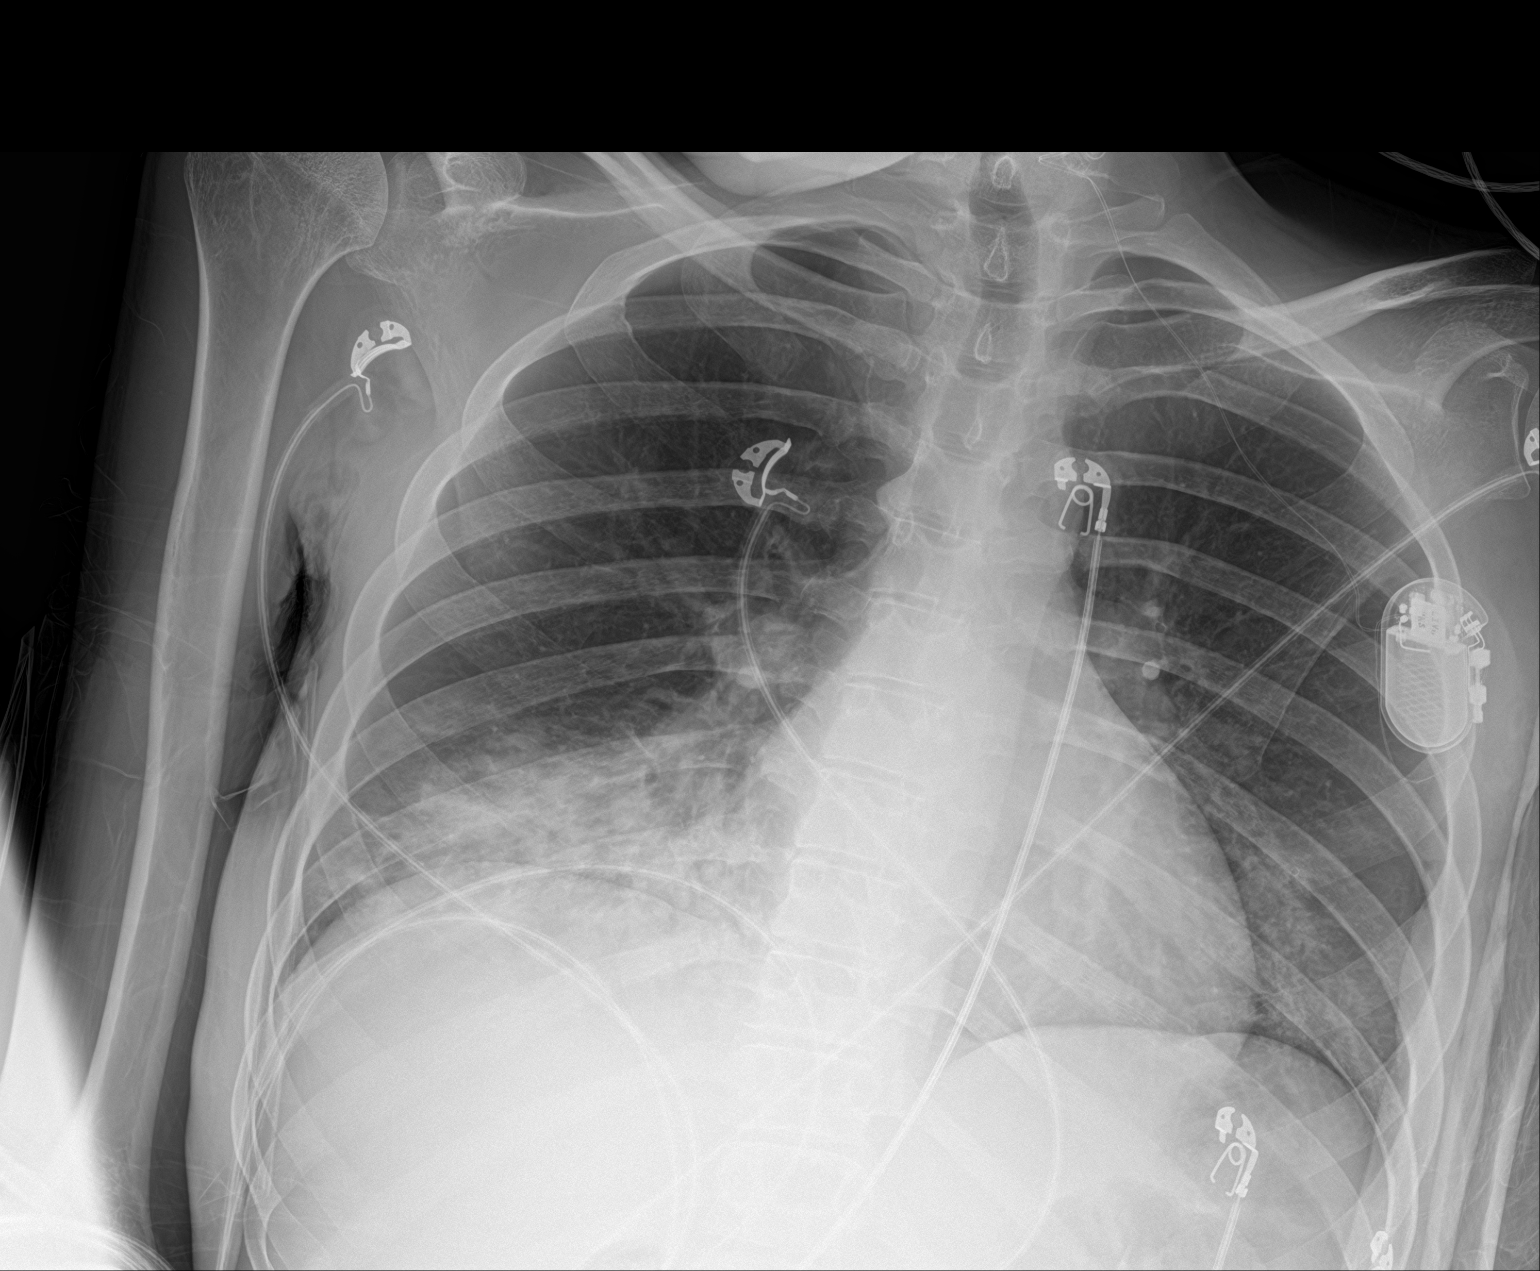

[2 of 2 positions shown; findings below may reference images not displayed]

FINDINGS: Vagal nerve stimulator again noted. The heart size and mediastinal
contours are within normal limits. Patchy consolidation in the right
middle and lower lobes. No pleural effusion or pneumothorax. No
acute osseous abnormality.
IMPRESSION: 1. Right middle and lower lobe pneumonia.

## 2022-10-13 IMAGING — CT CT NECK W/ CM
3 of 5 series · 15 of 36 positions shown, 17 images · IV contrast (omnipaque)
Comparison: 03/08/2015 neck radiograph

CLINICAL DATA: Drooling and coughing after ingesting AT back.

EXAM:
CT NECK WITH CONTRAST
TECHNIQUE: Multidetector CT imaging of the neck was performed using the
standard protocol following the bolus administration of intravenous
contrast.
CONTRAST:  60mL OMNIPAQUE IOHEXOL 350 MG/ML SOLN

[Series 6: sag neck · sagittal · 0.55mm/px · 6 of 86 slices shown]
[im 15/86  bone]
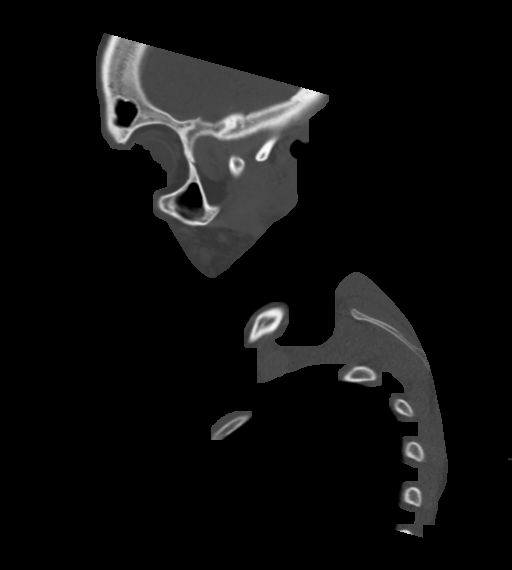
[im 16/86  soft-tissue]
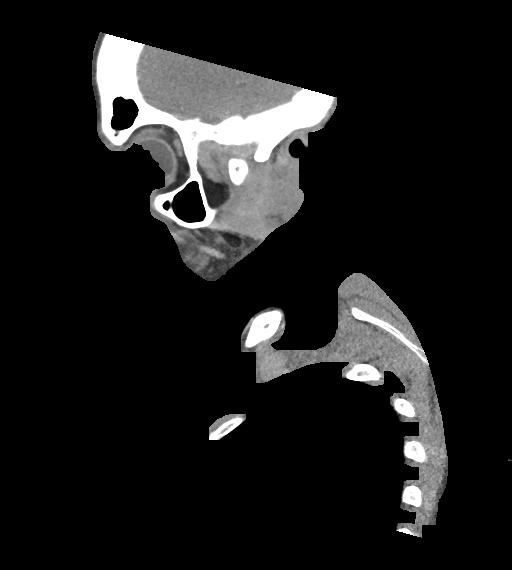
[im 29/86  bone]
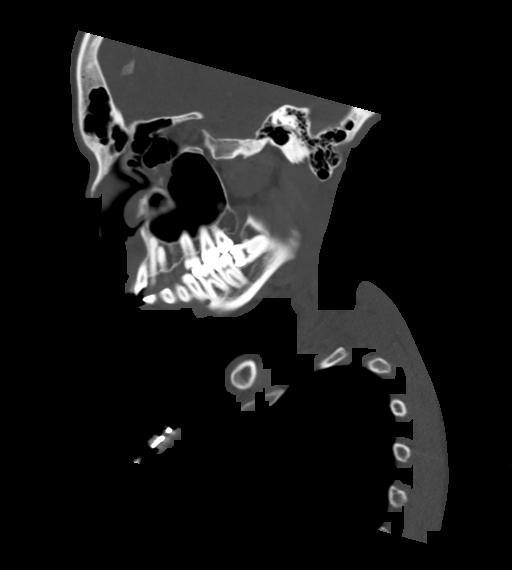
[im 43/86  bone]
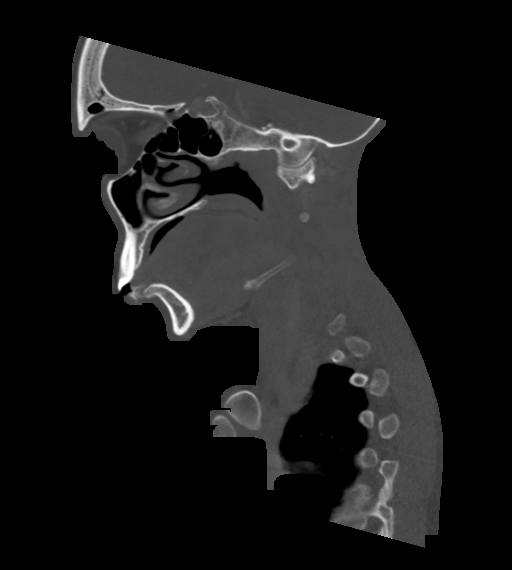
[im 57/86  bone]
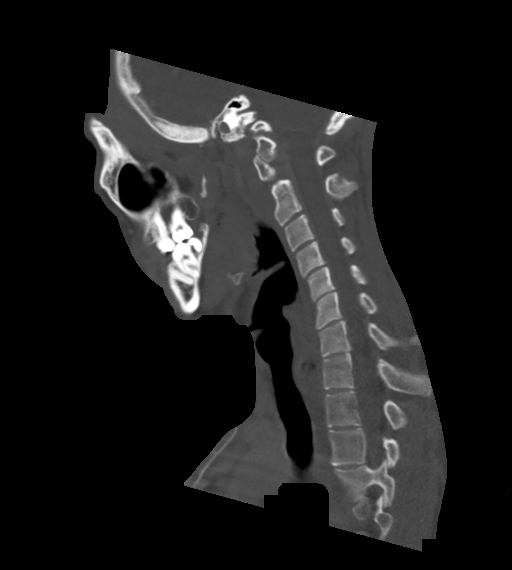
[im 71/86  bone]
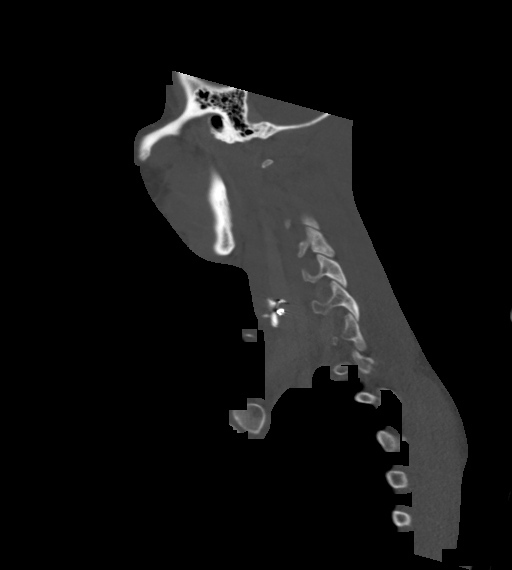

[Series 8: cor neck 2 · coronal · 0.43mm/px · 3 of 119 slices shown]
[im 31/119  bone]
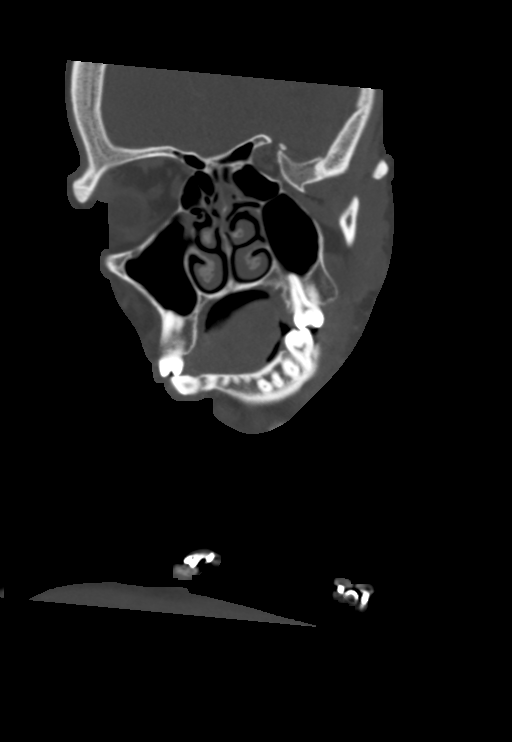
[im 50/119  bone]
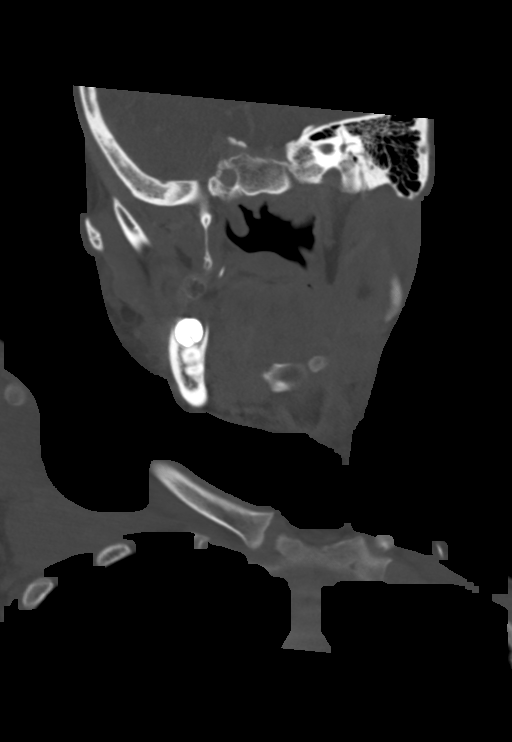
[im 69/119  bone]
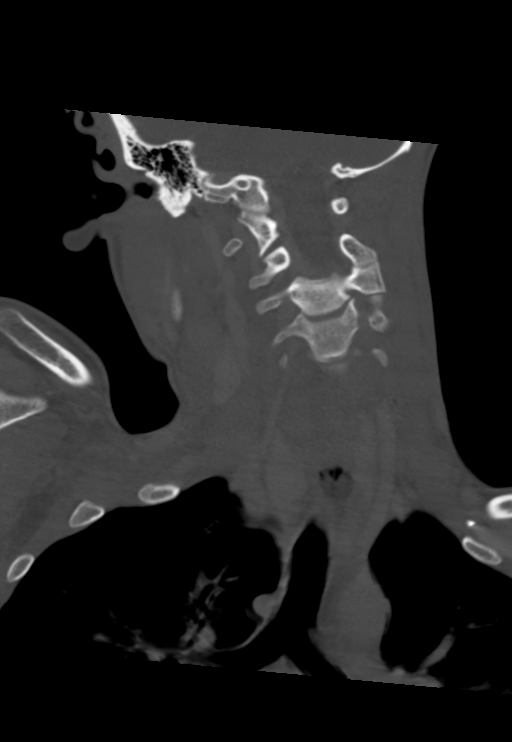

[Series 9: orthogonal ax · axial · 0.43mm/px · z∈[-322,-111]mm · 6 of 156 slices shown, 8 images]
[im 23/156  soft-tissue]
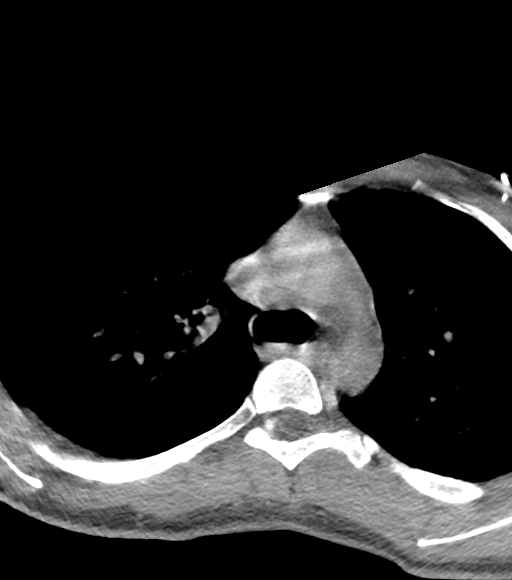
[im 23/156  bone]
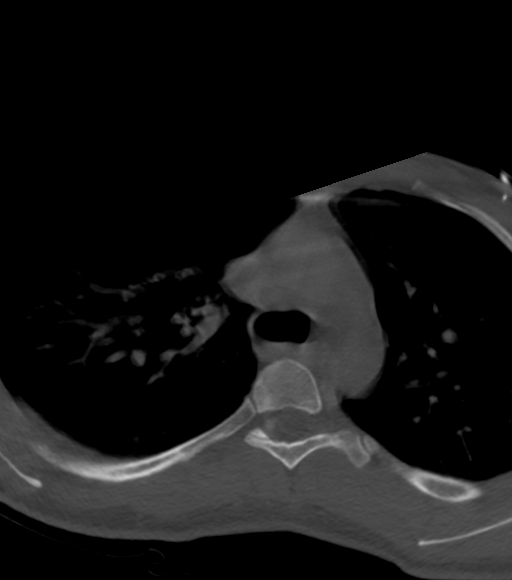
[im 45/156  bone]
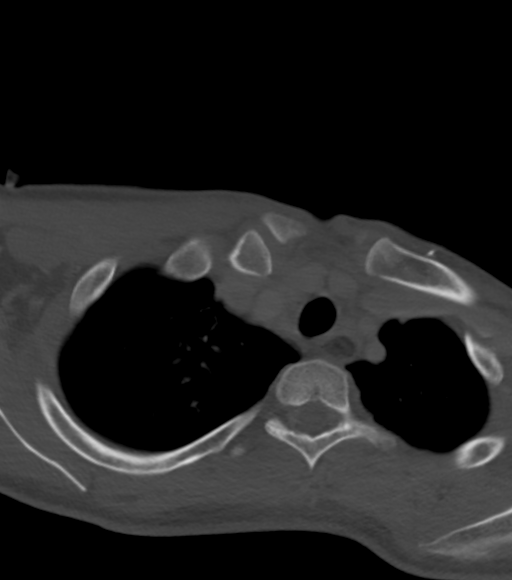
[im 67/156  bone]
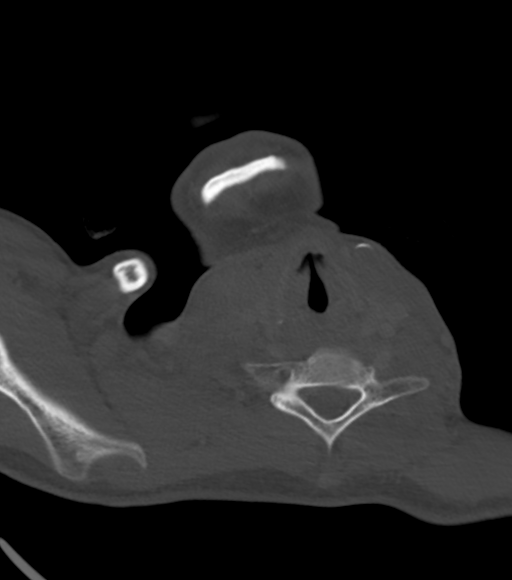
[im 89/156  bone]
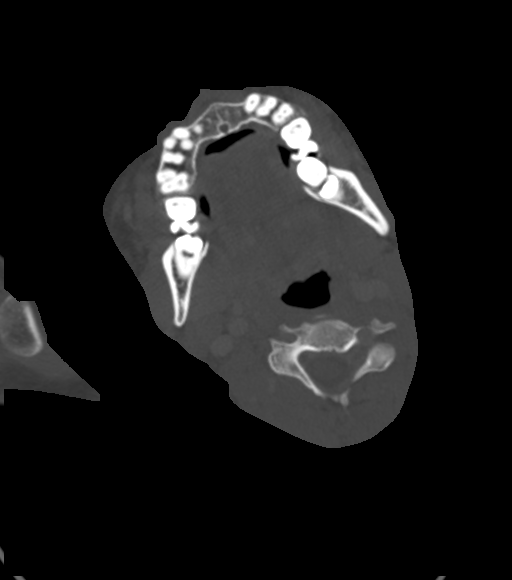
[im 111/156  soft-tissue]
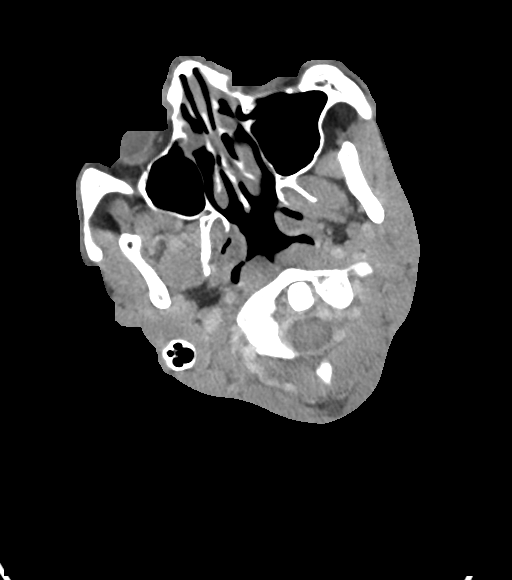
[im 111/156  bone]
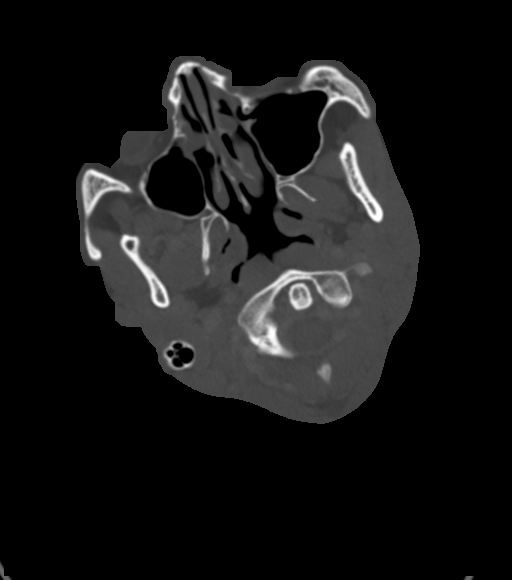
[im 133/156  bone]
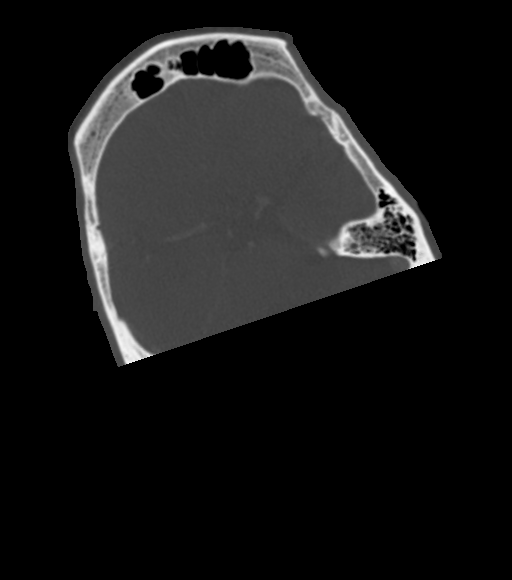

[15 of 36 positions shown; findings below may reference images not displayed]

FINDINGS: Pharynx and larynx: Pharynx and larynx are negative for swelling,
visible injury, or visible inflammation. In the upper cervical
esophagus is a elongated area of solid appearing material that
contains internal gas. This structure measures 5 cm in length by
Cm in thickness. Superiorly there is an associated tiny high-density
area that is not typical of a staple. No underlying esophageal wall
thickening.

Salivary glands: No inflammation, mass, or stone.

Thyroid: Normal.

Lymph nodes: None enlarged or abnormal density.

Vascular: Vagal nerve stimulator on the left.

Limited intracranial: Negative

Visualized orbits: Negative

Mastoids and visualized paranasal sinuses: Clear

Skeleton: Negative

Upper chest: Negative
IMPRESSION: Probable food impaction in the cervical esophagus.

## 2022-10-21 ENCOUNTER — Other Ambulatory Visit: Payer: Self-pay

## 2022-10-22 ENCOUNTER — Other Ambulatory Visit: Payer: Self-pay

## 2022-10-27 ENCOUNTER — Other Ambulatory Visit: Payer: Self-pay

## 2022-10-28 ENCOUNTER — Other Ambulatory Visit: Payer: Self-pay

## 2022-11-07 ENCOUNTER — Other Ambulatory Visit: Payer: Self-pay

## 2022-11-22 ENCOUNTER — Other Ambulatory Visit: Payer: Self-pay

## 2022-11-25 ENCOUNTER — Other Ambulatory Visit: Payer: Self-pay

## 2022-12-02 ENCOUNTER — Other Ambulatory Visit: Payer: Self-pay

## 2022-12-03 ENCOUNTER — Other Ambulatory Visit: Payer: Self-pay

## 2022-12-06 ENCOUNTER — Other Ambulatory Visit: Payer: Self-pay

## 2022-12-16 ENCOUNTER — Telehealth: Payer: Self-pay

## 2022-12-16 NOTE — Telephone Encounter (Signed)
Patient's mother reports that it has been filled out before.Is for her health insurance verifying patient's mental health. She is going to bring the form tomorrow.

## 2022-12-16 NOTE — Telephone Encounter (Signed)
Copied from CRM [id number]. Topic: General - Other >> Dec 16, 2022 11:22 AM Franchot Heidelberg wrote: Reason for CRM: Needs paperwork completed for insurance for the patient's mother's job. For continuation of coverage at work for the patient. Mother wants to know if pt needs an appt? Please advise   Best contact: [phone]

## 2022-12-16 NOTE — Telephone Encounter (Signed)
Need more info. What kind of paperwork? A letter, FMLA, disability form? Is this something we have written or filled out before?

## 2022-12-16 NOTE — Telephone Encounter (Signed)
Please review

## 2022-12-17 DIAGNOSIS — Z0279 Encounter for issue of other medical certificate: Secondary | ICD-10-CM

## 2022-12-17 NOTE — Telephone Encounter (Signed)
CMA Portion completed and form placed back in providers box

## 2022-12-17 NOTE — Telephone Encounter (Signed)
Pt's mother dropped forms off and placed in Dr. Theodis Aguas box.  She would like it faxed to the number on the form and let her know when this has been done.

## 2022-12-18 NOTE — Telephone Encounter (Signed)
Completed and sent to medical records

## 2022-12-20 ENCOUNTER — Other Ambulatory Visit: Payer: Self-pay

## 2022-12-31 ENCOUNTER — Other Ambulatory Visit: Payer: Self-pay

## 2023-01-16 ENCOUNTER — Other Ambulatory Visit: Payer: Self-pay

## 2023-01-26 ENCOUNTER — Other Ambulatory Visit: Payer: Self-pay

## 2023-01-27 ENCOUNTER — Other Ambulatory Visit: Payer: Self-pay

## 2023-01-28 ENCOUNTER — Other Ambulatory Visit: Payer: Self-pay

## 2023-01-28 MED ORDER — CLOBAZAM 2.5 MG/ML PO SUSP
15.0000 mg | Freq: Two times a day (BID) | ORAL | 5 refills | Status: DC
  Filled 2023-01-28: qty 360, 30d supply, fill #0
  Filled 2023-02-25: qty 360, 30d supply, fill #1
  Filled 2023-03-30: qty 360, 30d supply, fill #2
  Filled 2023-04-29: qty 360, 30d supply, fill #3
  Filled 2023-06-03: qty 360, 30d supply, fill #4
  Filled 2023-07-06: qty 360, 30d supply, fill #5

## 2023-02-04 ENCOUNTER — Other Ambulatory Visit: Payer: Self-pay

## 2023-02-05 ENCOUNTER — Other Ambulatory Visit: Payer: Self-pay

## 2023-02-26 ENCOUNTER — Other Ambulatory Visit: Payer: Self-pay

## 2023-03-05 ENCOUNTER — Other Ambulatory Visit: Payer: Self-pay

## 2023-03-31 ENCOUNTER — Other Ambulatory Visit: Payer: Self-pay

## 2023-04-03 ENCOUNTER — Other Ambulatory Visit: Payer: Self-pay

## 2023-04-22 ENCOUNTER — Other Ambulatory Visit: Payer: Self-pay

## 2023-04-29 ENCOUNTER — Other Ambulatory Visit: Payer: Self-pay

## 2023-05-05 ENCOUNTER — Other Ambulatory Visit: Payer: Self-pay

## 2023-05-06 ENCOUNTER — Other Ambulatory Visit: Payer: Self-pay

## 2023-05-06 MED ORDER — CLONAZEPAM 0.5 MG PO TABS
0.5000 mg | ORAL_TABLET | Freq: Three times a day (TID) | ORAL | 5 refills | Status: AC
  Filled 2023-05-06: qty 90, 30d supply, fill #0
  Filled 2023-06-15: qty 90, 30d supply, fill #1

## 2023-05-14 ENCOUNTER — Other Ambulatory Visit (HOSPITAL_COMMUNITY): Payer: Self-pay

## 2023-05-21 ENCOUNTER — Other Ambulatory Visit: Payer: Self-pay

## 2023-05-25 ENCOUNTER — Other Ambulatory Visit: Payer: Self-pay

## 2023-05-26 ENCOUNTER — Other Ambulatory Visit: Payer: Self-pay

## 2023-05-27 ENCOUNTER — Other Ambulatory Visit: Payer: Self-pay

## 2023-06-03 ENCOUNTER — Other Ambulatory Visit: Payer: Self-pay

## 2023-06-15 ENCOUNTER — Other Ambulatory Visit: Payer: Self-pay

## 2023-06-16 ENCOUNTER — Other Ambulatory Visit: Payer: Self-pay

## 2023-06-16 MED ORDER — TRILEPTAL 300 MG/5ML PO SUSP
600.0000 mg | Freq: Two times a day (BID) | ORAL | 11 refills | Status: AC
  Filled 2023-06-16: qty 500, 25d supply, fill #0
  Filled 2023-07-06: qty 500, 25d supply, fill #1
  Filled 2023-08-04: qty 500, 25d supply, fill #2
  Filled 2023-08-26: qty 500, 25d supply, fill #3
  Filled 2023-09-25: qty 500, 25d supply, fill #4
  Filled 2023-10-20: qty 500, 25d supply, fill #5
  Filled 2023-11-11: qty 500, 25d supply, fill #6
  Filled 2023-12-05: qty 500, 25d supply, fill #7
  Filled 2024-01-01: qty 500, 25d supply, fill #8
  Filled 2024-01-26: qty 500, 25d supply, fill #9
  Filled 2024-02-18: qty 500, 25d supply, fill #10

## 2023-06-17 ENCOUNTER — Other Ambulatory Visit: Payer: Self-pay

## 2023-07-06 ENCOUNTER — Other Ambulatory Visit: Payer: Self-pay

## 2023-07-07 ENCOUNTER — Other Ambulatory Visit: Payer: Self-pay

## 2023-07-29 ENCOUNTER — Other Ambulatory Visit: Payer: Self-pay

## 2023-07-29 DIAGNOSIS — G40419 Other generalized epilepsy and epileptic syndromes, intractable, without status epilepticus: Secondary | ICD-10-CM | POA: Diagnosis not present

## 2023-07-29 DIAGNOSIS — G40909 Epilepsy, unspecified, not intractable, without status epilepticus: Secondary | ICD-10-CM | POA: Diagnosis not present

## 2023-07-29 DIAGNOSIS — G40901 Epilepsy, unspecified, not intractable, with status epilepticus: Secondary | ICD-10-CM | POA: Diagnosis not present

## 2023-07-29 DIAGNOSIS — R569 Unspecified convulsions: Secondary | ICD-10-CM | POA: Diagnosis not present

## 2023-07-29 MED ORDER — CLOBAZAM 2.5 MG/ML PO SUSP
15.0000 mg | Freq: Two times a day (BID) | ORAL | 5 refills | Status: DC
  Filled 2023-08-04: qty 360, 30d supply, fill #0
  Filled 2023-09-01: qty 360, 30d supply, fill #1
  Filled 2023-10-05: qty 360, 30d supply, fill #2
  Filled 2023-10-31: qty 360, 30d supply, fill #3
  Filled 2023-12-05: qty 360, 30d supply, fill #4
  Filled 2024-01-01 – 2024-01-05 (×2): qty 360, 30d supply, fill #5

## 2023-07-29 MED ORDER — LEVOCARNITINE 1 GM/10ML PO SOLN
500.0000 mg | Freq: Two times a day (BID) | ORAL | 11 refills | Status: AC
  Filled 2023-07-29: qty 300, 30d supply, fill #0
  Filled 2023-09-25: qty 300, 30d supply, fill #1
  Filled 2023-10-31: qty 300, 30d supply, fill #2
  Filled 2023-12-05: qty 300, 30d supply, fill #3
  Filled 2024-02-03: qty 300, 30d supply, fill #4

## 2023-07-29 MED ORDER — TOPIRAMATE 200 MG PO TABS
200.0000 mg | ORAL_TABLET | Freq: Two times a day (BID) | ORAL | 3 refills | Status: AC
  Filled 2023-07-29 – 2023-09-01 (×2): qty 180, 90d supply, fill #0
  Filled 2023-12-05: qty 180, 90d supply, fill #1
  Filled 2024-02-25: qty 180, 90d supply, fill #2

## 2023-07-29 MED ORDER — CLONAZEPAM 0.5 MG PO TABS
0.5000 mg | ORAL_TABLET | Freq: Three times a day (TID) | ORAL | 5 refills | Status: DC
  Filled 2023-07-29: qty 90, 30d supply, fill #0
  Filled 2023-09-30: qty 90, 30d supply, fill #1
  Filled 2023-11-13: qty 90, 30d supply, fill #2
  Filled 2023-12-26: qty 90, 30d supply, fill #3

## 2023-07-29 MED ORDER — VALPROIC ACID 250 MG/5ML PO SOLN
500.0000 mg | Freq: Two times a day (BID) | ORAL | 11 refills | Status: AC
  Filled 2023-07-29: qty 600, 30d supply, fill #0
  Filled 2023-09-01: qty 600, 30d supply, fill #1
  Filled 2023-09-30: qty 600, 30d supply, fill #2
  Filled 2023-10-31 – 2023-11-01 (×2): qty 600, 30d supply, fill #3
  Filled 2023-11-21 – 2023-11-24 (×2): qty 600, 30d supply, fill #4
  Filled 2023-12-26: qty 600, 30d supply, fill #5
  Filled 2024-01-26: qty 600, 30d supply, fill #6
  Filled 2024-02-25: qty 600, 30d supply, fill #7

## 2023-08-04 ENCOUNTER — Other Ambulatory Visit: Payer: Self-pay

## 2023-08-26 ENCOUNTER — Other Ambulatory Visit: Payer: Self-pay

## 2023-08-27 ENCOUNTER — Other Ambulatory Visit: Payer: Self-pay

## 2023-09-01 ENCOUNTER — Other Ambulatory Visit: Payer: Self-pay

## 2023-09-02 ENCOUNTER — Other Ambulatory Visit: Payer: Self-pay

## 2023-09-03 ENCOUNTER — Other Ambulatory Visit: Payer: Self-pay

## 2023-09-04 ENCOUNTER — Other Ambulatory Visit: Payer: Self-pay

## 2023-09-04 DIAGNOSIS — Z4542 Encounter for adjustment and management of neuropacemaker (brain) (peripheral nerve) (spinal cord): Secondary | ICD-10-CM | POA: Diagnosis not present

## 2023-09-04 DIAGNOSIS — G40919 Epilepsy, unspecified, intractable, without status epilepticus: Secondary | ICD-10-CM | POA: Diagnosis not present

## 2023-09-04 MED ORDER — OXYCODONE HCL 5 MG PO TABS
5.0000 mg | ORAL_TABLET | Freq: Four times a day (QID) | ORAL | 0 refills | Status: AC | PRN
  Filled 2023-09-04: qty 8, 2d supply, fill #0

## 2023-09-04 MED ORDER — SENNOSIDES-DOCUSATE SODIUM 8.6-50 MG PO TABS
2.0000 | ORAL_TABLET | Freq: Every day | ORAL | 0 refills | Status: AC
  Filled 2023-09-04: qty 40, 20d supply, fill #0

## 2023-09-04 MED ORDER — POLYETHYLENE GLYCOL 3350 17 GM/SCOOP PO POWD
ORAL | 0 refills | Status: AC
  Filled 2023-09-04: qty 510, 30d supply, fill #0

## 2023-09-04 MED ORDER — ONDANSETRON HCL 4 MG PO TABS
4.0000 mg | ORAL_TABLET | Freq: Three times a day (TID) | ORAL | 0 refills | Status: AC | PRN
  Filled 2023-09-04: qty 20, 7d supply, fill #0

## 2023-09-18 DIAGNOSIS — Z9689 Presence of other specified functional implants: Secondary | ICD-10-CM | POA: Diagnosis not present

## 2023-09-18 DIAGNOSIS — Z4542 Encounter for adjustment and management of neuropacemaker (brain) (peripheral nerve) (spinal cord): Secondary | ICD-10-CM | POA: Diagnosis not present

## 2023-09-25 ENCOUNTER — Other Ambulatory Visit: Payer: Self-pay

## 2023-10-01 ENCOUNTER — Other Ambulatory Visit: Payer: Self-pay

## 2023-10-06 ENCOUNTER — Other Ambulatory Visit: Payer: Self-pay

## 2023-10-07 ENCOUNTER — Other Ambulatory Visit: Payer: Self-pay

## 2023-10-20 ENCOUNTER — Other Ambulatory Visit: Payer: Self-pay

## 2023-10-31 ENCOUNTER — Other Ambulatory Visit: Payer: Self-pay

## 2023-11-01 ENCOUNTER — Other Ambulatory Visit: Payer: Self-pay

## 2023-11-03 ENCOUNTER — Other Ambulatory Visit: Payer: Self-pay

## 2023-11-12 ENCOUNTER — Other Ambulatory Visit: Payer: Self-pay

## 2023-11-13 ENCOUNTER — Other Ambulatory Visit: Payer: Self-pay

## 2023-11-21 ENCOUNTER — Other Ambulatory Visit: Payer: Self-pay

## 2023-12-05 ENCOUNTER — Other Ambulatory Visit: Payer: Self-pay

## 2023-12-26 ENCOUNTER — Other Ambulatory Visit: Payer: Self-pay

## 2024-01-01 ENCOUNTER — Other Ambulatory Visit: Payer: Self-pay

## 2024-01-01 ENCOUNTER — Other Ambulatory Visit (HOSPITAL_COMMUNITY): Payer: Self-pay

## 2024-01-03 ENCOUNTER — Other Ambulatory Visit: Payer: Self-pay

## 2024-01-05 ENCOUNTER — Other Ambulatory Visit: Payer: Self-pay

## 2024-01-26 ENCOUNTER — Other Ambulatory Visit: Payer: Self-pay

## 2024-01-27 ENCOUNTER — Other Ambulatory Visit: Payer: Self-pay

## 2024-01-28 ENCOUNTER — Other Ambulatory Visit: Payer: Self-pay

## 2024-01-30 ENCOUNTER — Other Ambulatory Visit: Payer: Self-pay

## 2024-01-30 MED ORDER — CLONAZEPAM 0.5 MG PO TABS
0.5000 mg | ORAL_TABLET | Freq: Three times a day (TID) | ORAL | 5 refills | Status: AC
  Filled 2024-01-30: qty 90, 30d supply, fill #0

## 2024-01-30 MED ORDER — CLOBAZAM 2.5 MG/ML PO SUSP
15.0000 mg | Freq: Two times a day (BID) | ORAL | 5 refills | Status: AC
  Filled 2024-02-03: qty 360, 30d supply, fill #0
  Filled 2024-03-02: qty 360, 30d supply, fill #1

## 2024-02-03 ENCOUNTER — Other Ambulatory Visit: Payer: Self-pay

## 2024-02-05 ENCOUNTER — Other Ambulatory Visit (HOSPITAL_COMMUNITY): Payer: Self-pay

## 2024-02-18 ENCOUNTER — Other Ambulatory Visit: Payer: Self-pay

## 2024-03-03 ENCOUNTER — Other Ambulatory Visit: Payer: Self-pay
